# Patient Record
Sex: Male | Born: 1945 | Race: Black or African American | Hispanic: No | Marital: Single | Smoking: Former smoker
Health system: Southern US, Community
[De-identification: ages and names within clinical notes are randomized; demographics above are authoritative.]

## PROBLEM LIST (undated history)

## (undated) DIAGNOSIS — C159 Malignant neoplasm of esophagus, unspecified: Secondary | ICD-10-CM

## (undated) DIAGNOSIS — K219 Gastro-esophageal reflux disease without esophagitis: Secondary | ICD-10-CM

## (undated) DIAGNOSIS — G060 Intracranial abscess and granuloma: Secondary | ICD-10-CM

## (undated) DIAGNOSIS — I10 Essential (primary) hypertension: Secondary | ICD-10-CM

## (undated) DIAGNOSIS — E785 Hyperlipidemia, unspecified: Secondary | ICD-10-CM

## (undated) DIAGNOSIS — Z923 Personal history of irradiation: Secondary | ICD-10-CM

## (undated) DIAGNOSIS — K2270 Barrett's esophagus without dysplasia: Secondary | ICD-10-CM

## (undated) HISTORY — DX: Gastro-esophageal reflux disease without esophagitis: K21.9

## (undated) HISTORY — DX: Personal history of irradiation: Z92.3

## (undated) HISTORY — DX: Hyperlipidemia, unspecified: E78.5

## (undated) HISTORY — DX: Essential (primary) hypertension: I10

## (undated) HISTORY — DX: Intracranial abscess and granuloma: G06.0

---

## 2007-06-11 ENCOUNTER — Ambulatory Visit: Payer: Self-pay | Admitting: Gastroenterology

## 2007-06-25 ENCOUNTER — Ambulatory Visit: Payer: Self-pay | Admitting: Gastroenterology

## 2007-12-29 ENCOUNTER — Encounter: Admission: RE | Admit: 2007-12-29 | Discharge: 2007-12-29 | Payer: Self-pay | Admitting: Family Medicine

## 2009-03-14 ENCOUNTER — Encounter: Admission: RE | Admit: 2009-03-14 | Discharge: 2009-03-14 | Payer: Self-pay | Admitting: Family Medicine

## 2012-05-06 ENCOUNTER — Other Ambulatory Visit: Payer: Self-pay | Admitting: Family Medicine

## 2012-05-06 ENCOUNTER — Ambulatory Visit
Admission: RE | Admit: 2012-05-06 | Discharge: 2012-05-06 | Disposition: A | Discharge status: Home / Self Care | Payer: Self-pay | Source: Ambulatory Visit | Attending: Family Medicine | Admitting: Family Medicine

## 2012-05-06 DIAGNOSIS — M25559 Pain in unspecified hip: Secondary | ICD-10-CM

## 2013-10-10 DIAGNOSIS — G060 Intracranial abscess and granuloma: Secondary | ICD-10-CM

## 2013-10-10 HISTORY — DX: Intracranial abscess and granuloma: G06.0

## 2013-12-29 DIAGNOSIS — K2270 Barrett's esophagus without dysplasia: Secondary | ICD-10-CM

## 2013-12-29 HISTORY — DX: Barrett's esophagus without dysplasia: K22.70

## 2014-01-19 ENCOUNTER — Encounter: Payer: Self-pay | Admitting: Gastroenterology

## 2014-01-19 ENCOUNTER — Telehealth: Payer: Self-pay | Admitting: Gastroenterology

## 2014-01-19 NOTE — Telephone Encounter (Signed)
Pt having problems swallowing and is c/o reflux, gerd. Dr. Kennon Holter requests pt be seen. Pt scheduled to see Dr. Deatra Ina tomorrow morning at 10:30am. Jana Half to notify pt of appt date and time.

## 2014-01-20 ENCOUNTER — Encounter: Payer: Self-pay | Admitting: Gastroenterology

## 2014-01-20 ENCOUNTER — Ambulatory Visit (INDEPENDENT_AMBULATORY_CARE_PROVIDER_SITE_OTHER): Payer: Medicare Other | Admitting: Gastroenterology

## 2014-01-20 VITALS — BP 112/70 | HR 70 | Ht 66.0 in | Wt 213.0 lb

## 2014-01-20 DIAGNOSIS — R1310 Dysphagia, unspecified: Secondary | ICD-10-CM

## 2014-01-20 DIAGNOSIS — I10 Essential (primary) hypertension: Secondary | ICD-10-CM

## 2014-01-20 NOTE — Progress Notes (Signed)
    _                                                                                                                History of Present Illness: Pleasant 68 year old Afro-American male referred for evaluation of dysphagia.  He has been complaining of dysphagia to solids for at least 4 weeks.  He requires liquids to pass a solid food bolus.  He denies odynophagia.  He rarely has pyrosis.  He is on omeprazole.  He claims he has similar problems when he was 35 but does not recall any specific therapy.  Last colonoscopy approximately 7 years ago apparently was normal.    Past Medical History  Diagnosis Date  . GERD (gastroesophageal reflux disease)   . Hypertension   . Hyperlipemia    History reviewed. No pertinent past surgical history. family history includes Breast cancer in his sister. Current Outpatient Prescriptions  Medication Sig Dispense Refill  . cloNIDine (CATAPRES) 0.1 MG tablet Take 0.1 mg by mouth 2 (two) times daily.      Marland Kitchen dutasteride (AVODART) 0.5 MG capsule Take 0.5 mg by mouth daily.      Marland Kitchen omeprazole (PRILOSEC) 40 MG capsule Take 40 mg by mouth daily.      . simvastatin (ZOCOR) 40 MG tablet Take 40 mg by mouth daily.      . valsartan-hydrochlorothiazide (DIOVAN-HCT) 160-25 MG per tablet Take 1 tablet by mouth daily.       No current facility-administered medications for this visit.   Allergies as of 01/20/2014  . (No Known Allergies)    reports that he quit smoking about 15 years ago. He has never used smokeless tobacco. He reports that he drinks alcohol. He reports that he does not use illicit drugs.     Review of Systems: Pertinent positive and negative review of systems were noted in the above HPI section. All other review of systems were otherwise negative.  Vital signs were reviewed in today's medical record Physical Exam: General: Well developed , well nourished, no acute distress Skin: anicteric Head: Normocephalic and atraumatic Eyes:   sclerae anicteric, EOMI Ears: Normal auditory acuity Mouth: No deformity or lesions Neck: Supple, no masses or thyromegaly Lungs: Clear throughout to auscultation Heart: Regular rate and rhythm; no murmurs, rubs or bruits Abdomen: Soft, non tender and non distended. No masses, hepatosplenomegaly or hernias noted. Normal Bowel sounds Rectal:deferred Musculoskeletal: Symmetrical with no gross deformities  Skin: No lesions on visible extremities Pulses:  Normal pulses noted Extremities: No clubbing, cyanosis, edema or deformities noted Neurological: Alert oriented x 4, grossly nonfocal Cervical Nodes:  No significant cervical adenopathy Inguinal Nodes: No significant inguinal adenopathy Psychological:  Alert and cooperative. Normal mood and affect  See Assessment and Plan under Problem List

## 2014-01-20 NOTE — Patient Instructions (Signed)

## 2014-01-20 NOTE — Assessment & Plan Note (Signed)
Solid food dysphagia most likely is 2 to a peptic esophageal stricture.  Recommendations #1 continue omeprazole #2 upper endoscopy with dilatation as indicated  Risks, alternatives, and complications of the procedure, including bleeding, perforation, and possible need for surgery, were explained to the patient.  Patient's questions were answered.

## 2014-01-21 ENCOUNTER — Encounter: Payer: Medicare Other | Admitting: Gastroenterology

## 2014-01-26 ENCOUNTER — Ambulatory Visit (AMBULATORY_SURGERY_CENTER): Payer: Medicare Other | Admitting: Gastroenterology

## 2014-01-26 ENCOUNTER — Telehealth: Payer: Self-pay

## 2014-01-26 ENCOUNTER — Other Ambulatory Visit: Payer: Self-pay

## 2014-01-26 ENCOUNTER — Encounter: Payer: Self-pay | Admitting: Gastroenterology

## 2014-01-26 ENCOUNTER — Other Ambulatory Visit (INDEPENDENT_AMBULATORY_CARE_PROVIDER_SITE_OTHER): Payer: Medicare Other

## 2014-01-26 VITALS — BP 120/73 | HR 63 | Temp 97.8°F | Resp 20 | Ht 66.0 in | Wt 213.0 lb

## 2014-01-26 DIAGNOSIS — I10 Essential (primary) hypertension: Secondary | ICD-10-CM

## 2014-01-26 DIAGNOSIS — C159 Malignant neoplasm of esophagus, unspecified: Secondary | ICD-10-CM

## 2014-01-26 DIAGNOSIS — K229 Disease of esophagus, unspecified: Secondary | ICD-10-CM

## 2014-01-26 DIAGNOSIS — K228 Other specified diseases of esophagus: Secondary | ICD-10-CM

## 2014-01-26 DIAGNOSIS — D490 Neoplasm of unspecified behavior of digestive system: Secondary | ICD-10-CM

## 2014-01-26 DIAGNOSIS — R1310 Dysphagia, unspecified: Secondary | ICD-10-CM

## 2014-01-26 DIAGNOSIS — K2289 Other specified disease of esophagus: Secondary | ICD-10-CM

## 2014-01-26 DIAGNOSIS — D6859 Other primary thrombophilia: Secondary | ICD-10-CM

## 2014-01-26 HISTORY — DX: Malignant neoplasm of esophagus, unspecified: C15.9

## 2014-01-26 LAB — CBC WITH DIFFERENTIAL/PLATELET
BASOS PCT: 0.3 % (ref 0.0–3.0)
Basophils Absolute: 0 10*3/uL (ref 0.0–0.1)
EOS PCT: 4.4 % (ref 0.0–5.0)
Eosinophils Absolute: 0.2 10*3/uL (ref 0.0–0.7)
HEMATOCRIT: 31.6 % — AB (ref 39.0–52.0)
HEMOGLOBIN: 10.4 g/dL — AB (ref 13.0–17.0)
LYMPHS ABS: 1.3 10*3/uL (ref 0.7–4.0)
Lymphocytes Relative: 23.7 % (ref 12.0–46.0)
MCHC: 33 g/dL (ref 30.0–36.0)
MCV: 82.3 fl (ref 78.0–100.0)
MONO ABS: 0.5 10*3/uL (ref 0.1–1.0)
MONOS PCT: 8.8 % (ref 3.0–12.0)
Neutro Abs: 3.4 10*3/uL (ref 1.4–7.7)
Neutrophils Relative %: 62.8 % (ref 43.0–77.0)
PLATELETS: 256 10*3/uL (ref 150.0–400.0)
RBC: 3.84 Mil/uL — AB (ref 4.22–5.81)
RDW: 15.2 % — ABNORMAL HIGH (ref 11.5–14.6)
WBC: 5.4 10*3/uL (ref 4.5–10.5)

## 2014-01-26 LAB — COMPREHENSIVE METABOLIC PANEL
ALBUMIN: 3.3 g/dL — AB (ref 3.5–5.2)
ALT: 9 U/L (ref 0–53)
AST: 12 U/L (ref 0–37)
Alkaline Phosphatase: 62 U/L (ref 39–117)
BILIRUBIN TOTAL: 0.6 mg/dL (ref 0.3–1.2)
BUN: 11 mg/dL (ref 6–23)
CO2: 29 meq/L (ref 19–32)
Calcium: 8.8 mg/dL (ref 8.4–10.5)
Chloride: 104 mEq/L (ref 96–112)
Creatinine, Ser: 1 mg/dL (ref 0.4–1.5)
GFR: 98.92 mL/min (ref >60.00)
GLUCOSE: 105 mg/dL — AB (ref 70–99)
POTASSIUM: 3.9 meq/L (ref 3.5–5.1)
SODIUM: 138 meq/L (ref 135–145)
TOTAL PROTEIN: 6.3 g/dL (ref 6.0–8.3)

## 2014-01-26 LAB — PROTIME-INR
INR: 1.1 ratio — AB (ref 0.8–1.0)
PROTHROMBIN TIME: 11.7 s (ref 9.6–13.1)

## 2014-01-26 MED ORDER — SODIUM CHLORIDE 0.9 % IV SOLN: 500.0000 mL | INTRAVENOUS | Status: DC

## 2014-01-26 NOTE — Progress Notes (Addendum)
Per Dr. Deatra Ina procedure report not given to pt, Dr. Deatra Ina spoke with pt and family member (niece)-adm  Pt sent to down to lab after discharge-adm

## 2014-01-26 NOTE — Patient Instructions (Addendum)

## 2014-01-26 NOTE — Progress Notes (Signed)
Called to room to assist during endoscopic procedure.  Patient ID and intended procedure confirmed with present staff. Received instructions for my participation in the procedure from the performing physician.  

## 2014-01-26 NOTE — Op Note (Signed)
[place]  [address]. [place], [id number]   ENDOSCOPY PROCEDURE REPORT  PATIENT: [name] [name], [name] [name]  MR#: [id number] BIRTHDATE: Oct 06, 1945 , 68  yrs. old GENDER: Male ENDOSCOPIST: Inda Castle, MD REFERRED BY:  Lindwood Qua, M.D. PROCEDURE DATE:  01/26/2014 PROCEDURE:  EGD w/ biopsy ASA CLASS:     Class II INDICATIONS:  Dysphagia. MEDICATIONS: MAC sedation, administered by CRNA, propofol (Diprivan) 150mg  IV, and Simethicone 0.6cc PO TOPICAL ANESTHETIC: Cetacaine Spray  DESCRIPTION OF PROCEDURE: After the risks benefits and alternatives of the procedure were thoroughly explained, informed consent was obtained.  The LB [id number] endoscope was introduced through the mouth and advanced to the third portion of the duodenum. Without limitations.  The instrument was slowly withdrawn as the mucosa was fully examined.      Beginning at approximately 35 cm from the incisors there was an exophytic, friable mass.  The squamocolumnar junction was also noted at 35 cm.  The mass extended to the GE junction where there was a moderate stricture.  The 9 mm scope passed with resistance. Multiple biopsies were taken.   Beginning at approximately 35 cm from the incisors there was an exophytic, friable mass.  The squamocolumnar junction was also noted at 35 cm.  The mass extended to the GE junction where there was a moderate stricture.  The 9 mm scope passed with resistance.  Multiple biopsies were taken.   The remainder of the upper endoscopy exam was otherwise normal. Retroflexed views revealed no abnormalities.     The scope was then withdrawn from the patient and the procedure completed.  COMPLICATIONS: There were no complications. ENDOSCOPIC IMPRESSION: 1.   esophageal neoplasm 2.  Barrett's esophagus  RECOMMENDATIONS: CT of the chest, abdomen and pelvis CBC, comprehensive metabolic profile, INR oncology referral REPEAT EXAM:  eSigned:  Inda Castle,  MD 01/26/2014 11:07 AM   CC:  PATIENT NAME:  [name] [name], [name] [name] MR#: [id number]

## 2014-01-26 NOTE — Telephone Encounter (Signed)
Pt scheduled for CT CAP at [facility] CT 01/28/14@9 :30am. Pt to be NPO after 5:30am, drink bottle 1 of contrast at 7:30am, bottle 2 of contrast at 8:30am. [place] nurse to give pt instructions and sent pt to the lab on the way out from clinic.

## 2014-01-27 ENCOUNTER — Telehealth: Payer: Self-pay | Admitting: *Deleted

## 2014-01-27 NOTE — Telephone Encounter (Signed)
Called left vm message to call me and gave my phone number for an appt

## 2014-01-27 NOTE — Telephone Encounter (Signed)
Left message that we called for f/u 

## 2014-01-27 NOTE — Telephone Encounter (Signed)
Called pt with appt time and place.   He verbalized understanding.

## 2014-01-28 ENCOUNTER — Encounter (HOSPITAL_COMMUNITY): Payer: Self-pay

## 2014-01-28 ENCOUNTER — Ambulatory Visit (HOSPITAL_COMMUNITY)
Admission: RE | Admit: 2014-01-28 | Discharge: 2014-01-28 | Disposition: A | Discharge status: Home / Self Care | Payer: Medicare Other | Source: Ambulatory Visit | Attending: Gastroenterology | Admitting: Gastroenterology

## 2014-01-28 ENCOUNTER — Ambulatory Visit (HOSPITAL_COMMUNITY): Admission: RE | Admit: 2014-01-28 | Payer: Medicare Other | Source: Ambulatory Visit

## 2014-01-28 ENCOUNTER — Other Ambulatory Visit: Payer: Self-pay | Admitting: Gastroenterology

## 2014-01-28 ENCOUNTER — Ambulatory Visit
Admission: RE | Admit: 2014-01-28 | Discharge: 2014-01-28 | Disposition: A | Discharge status: Home / Self Care | Payer: Medicare Other | Source: Ambulatory Visit | Attending: Gastroenterology | Admitting: Gastroenterology

## 2014-01-28 DIAGNOSIS — R599 Enlarged lymph nodes, unspecified: Secondary | ICD-10-CM | POA: Insufficient documentation

## 2014-01-28 DIAGNOSIS — K229 Disease of esophagus, unspecified: Secondary | ICD-10-CM

## 2014-01-28 DIAGNOSIS — K228 Other specified diseases of esophagus: Secondary | ICD-10-CM

## 2014-01-28 DIAGNOSIS — D490 Neoplasm of unspecified behavior of digestive system: Secondary | ICD-10-CM

## 2014-01-28 DIAGNOSIS — K2289 Other specified disease of esophagus: Secondary | ICD-10-CM

## 2014-01-28 DIAGNOSIS — J8410 Pulmonary fibrosis, unspecified: Secondary | ICD-10-CM | POA: Insufficient documentation

## 2014-01-28 DIAGNOSIS — N289 Disorder of kidney and ureter, unspecified: Secondary | ICD-10-CM | POA: Insufficient documentation

## 2014-01-28 DIAGNOSIS — J479 Bronchiectasis, uncomplicated: Secondary | ICD-10-CM | POA: Insufficient documentation

## 2014-01-28 MED ORDER — IOHEXOL 300 MG/ML  SOLN
50.0000 mL | Freq: Once | INTRAMUSCULAR | Status: AC | PRN
  Administered 2014-01-28: 50 mL via ORAL

## 2014-01-28 MED ORDER — IOHEXOL 300 MG/ML  SOLN
100.0000 mL | Freq: Once | INTRAMUSCULAR | Status: AC | PRN
  Administered 2014-01-28: 100 mL via INTRAVENOUS

## 2014-02-01 ENCOUNTER — Encounter: Payer: Self-pay | Admitting: Internal Medicine

## 2014-02-01 ENCOUNTER — Ambulatory Visit (HOSPITAL_BASED_OUTPATIENT_CLINIC_OR_DEPARTMENT_OTHER): Payer: Medicare Other | Admitting: Internal Medicine

## 2014-02-01 ENCOUNTER — Other Ambulatory Visit: Payer: Self-pay

## 2014-02-01 ENCOUNTER — Other Ambulatory Visit: Payer: Self-pay | Admitting: Internal Medicine

## 2014-02-01 ENCOUNTER — Telehealth: Payer: Self-pay

## 2014-02-01 ENCOUNTER — Ambulatory Visit: Payer: Medicare Other

## 2014-02-01 ENCOUNTER — Ambulatory Visit (HOSPITAL_BASED_OUTPATIENT_CLINIC_OR_DEPARTMENT_OTHER): Payer: Medicare Other

## 2014-02-01 ENCOUNTER — Telehealth: Payer: Self-pay | Admitting: Internal Medicine

## 2014-02-01 VITALS — BP 138/65 | HR 62 | Temp 97.5°F | Resp 18 | Ht 66.0 in | Wt 208.6 lb

## 2014-02-01 DIAGNOSIS — C155 Malignant neoplasm of lower third of esophagus: Secondary | ICD-10-CM

## 2014-02-01 DIAGNOSIS — Z87891 Personal history of nicotine dependence: Secondary | ICD-10-CM

## 2014-02-01 DIAGNOSIS — C159 Malignant neoplasm of esophagus, unspecified: Secondary | ICD-10-CM

## 2014-02-01 DIAGNOSIS — Z803 Family history of malignant neoplasm of breast: Secondary | ICD-10-CM

## 2014-02-01 LAB — COMPREHENSIVE METABOLIC PANEL (CC13)
ALK PHOS: 72 U/L (ref 40–150)
ALT: <6 U/L (ref 0–55)
AST: 10 U/L (ref 5–34)
Albumin: 3.3 g/dL — ABNORMAL LOW (ref 3.5–5.0)
Anion Gap: 9 mEq/L (ref 3–11)
BILIRUBIN TOTAL: 0.79 mg/dL (ref 0.20–1.20)
BUN: 9.6 mg/dL (ref 7.0–26.0)
CO2: 27 mEq/L (ref 22–29)
Calcium: 9.5 mg/dL (ref 8.4–10.4)
Chloride: 106 mEq/L (ref 98–109)
Creatinine: 1.1 mg/dL (ref 0.7–1.3)
Glucose: 106 mg/dl (ref 70–140)
Potassium: 4 mEq/L (ref 3.5–5.1)
SODIUM: 142 meq/L (ref 136–145)
Total Protein: 6.6 g/dL (ref 6.4–8.3)

## 2014-02-01 LAB — CBC WITH DIFFERENTIAL/PLATELET
BASO%: 0.8 % (ref 0.0–2.0)
BASOS ABS: 0 10*3/uL (ref 0.0–0.1)
EOS%: 3.3 % (ref 0.0–7.0)
Eosinophils Absolute: 0.2 10*3/uL (ref 0.0–0.5)
HCT: 33.2 % — ABNORMAL LOW (ref 38.4–49.9)
HGB: 10.5 g/dL — ABNORMAL LOW (ref 13.0–17.1)
LYMPH%: 21.6 % (ref 14.0–49.0)
MCH: 26 pg — ABNORMAL LOW (ref 27.2–33.4)
MCHC: 31.5 g/dL — ABNORMAL LOW (ref 32.0–36.0)
MCV: 82.6 fL (ref 79.3–98.0)
MONO#: 0.5 10*3/uL (ref 0.1–0.9)
MONO%: 11.4 % (ref 0.0–14.0)
NEUT#: 2.9 10*3/uL (ref 1.5–6.5)
NEUT%: 62.9 % (ref 39.0–75.0)
PLATELETS: 274 10*3/uL (ref 140–400)
RBC: 4.02 10*6/uL — AB (ref 4.20–5.82)
RDW: 15.3 % — ABNORMAL HIGH (ref 11.0–14.6)
WBC: 4.7 10*3/uL (ref 4.0–10.3)
lymph#: 1 10*3/uL (ref 0.9–3.3)

## 2014-02-01 NOTE — Telephone Encounter (Signed)
Pt has been scheduled for 02/03/14 12 noon Left message on machine to call back

## 2014-02-01 NOTE — Telephone Encounter (Signed)
Gave pt appt for MD and Radiation Oncology, called Dr Ardis Hughs office left message with [facility] regarding EUS, pt sent to labs today

## 2014-02-01 NOTE — Progress Notes (Signed)
Checked in new patient with no financial issues. He [place] been [place] of [place]. °

## 2014-02-01 NOTE — Telephone Encounter (Signed)
I think EUS is a good next step. thanks Rendi Mapel, He needs upper EUS, radial +/- linear, with MAC sedation at [facility], this Thursday (7th). Thanks ----- Message ----- From: Inda Castle, MD Sent: 01/31/2014 1:42 PM To: Milus Banister, MD Dan, This pt has newly diagnosed adenoCa of the esophagus ( I mentioned this case to you last week). Would you kindly look at this CT and see if you want to do an EUS? At least 1 suspicious node is identified. Thanks, rob

## 2014-02-01 NOTE — Progress Notes (Signed)
[place] Telephone:([phone]) [phone]   Fax:([phone]) [phone]  CONSULT NOTE  REFERRING PHYSICIAN: Dr. [name]  REASON FOR CONSULTATION:  68 years old African American male recently diagnosed with esophageal carcinoma  HPI [name] [name] is a 68 y.o. male with a past medical history significant for hypertension, dyslipidemia, GERD as well as benign prostatic hypertrophy. The patient has been complaining of swallowing difficulty for 3-4 weeks with increased mucus production and difficulty swallowing solid food. He has a history of acid reflux and has been on treatment with Nexium but he was not taking it on daily basis. He also lost around 50 pounds in the last few weeks. He was seen by his primary care physician and referred to Dr. [name] for evaluation. On 01/26/2014 he underwent upper endoscopy under the care of Dr. Deatra Ina and it showed an exophytic friable mass at approximately 35 CM from the incisors. The mass extended to the GE junction where there was a moderate stricture. Multiple biopsies were taken. The findings were concerning for esophageal neoplasm as well as Barrett's esophagus. CT scan of the chest, abdomen and pelvis were performed on 01/28/2014. It showed marked irregular thickening of the distal esophagus at and immediately above the gastroesophageal junction, corresponds to known esophageal mass. Proximal to this, the esophagus is markedly dilated, with a a large air-fluid level. Prominent low right paratracheal lymph node measures 8 mm in short axis. Subcarinal  lymph node measuring 9 mm in short axis. No definite mediastinal or hilar lymph nodes meet criteria for pathologic enlargement at this  time. There was also enlarged lymph nodes in the gastrohepatic ligament measuring 1.6 CM in short axis concerning for nodal metastasis.  The patient was referred to me today for further evaluation and recommendation regarding his condition. When seen today he is  feeling fine except for the weight loss and dysphagia which is slightly better after the procedure. He denied having any significant chest pain, shortness of breath, cough or hemoptysis. He has no nausea or vomiting. The patient denied having any headache or visual changes. His family history significant for a sister who was diagnosed with breast cancer. The patient is single and has one son. He was accompanied by his niece [name] [name]. He is a former Nature conservation officer. He has a history of smoking 2 packs per day for around 35 years but quit 15 years ago. He also has a history of alcohol abuse but not recently and no history of drug abuse.  HPI  Past Medical History  Diagnosis Date  . GERD (gastroesophageal reflux disease)   . Hypertension   . Hyperlipemia     History reviewed. No pertinent past surgical history.  Family History  Problem Relation Age of Onset  . Breast cancer Sister     Social History History  Substance Use Topics  . Smoking status: Former Smoker    Quit date: 09/30/1998  . Smokeless tobacco: Never Used  . Alcohol Use: Yes     Comment: occasional    No Known Allergies  Current Outpatient Prescriptions  Medication Sig Dispense Refill  . cloNIDine (CATAPRES) 0.1 MG tablet Take 0.1 mg by mouth 2 (two) times daily.      Marland Kitchen dutasteride (AVODART) 0.5 MG capsule Take 0.5 mg by mouth daily.      Marland Kitchen omeprazole (PRILOSEC) 40 MG capsule Take 40 mg by mouth daily.      . simvastatin (ZOCOR) 40 MG tablet Take 40 mg by mouth daily.      Marland Kitchen  valsartan-hydrochlorothiazide (DIOVAN-HCT) 160-25 MG per tablet Take 1 tablet by mouth daily.       No current facility-administered medications for this visit.    Review of Systems  Constitutional: positive for fatigue and weight loss Eyes: negative Ears, nose, mouth, throat, and face: negative Respiratory: negative Cardiovascular: negative Gastrointestinal: positive for dyspepsia and  dysphagia Genitourinary:negative Integument/breast: negative Hematologic/lymphatic: negative Musculoskeletal:negative Neurological: negative Behavioral/Psych: negative Endocrine: negative Allergic/Immunologic: negative  Physical Exam  ION:GEXBM, healthy, no distress, well nourished and well developed SKIN: skin color, texture, turgor are normal, no rashes or significant lesions HEAD: Normocephalic, No masses, lesions, tenderness or abnormalities EYES: normal, PERRLA EARS: External ears normal, Canals clear OROPHARYNX:no exudate, no erythema and lips, buccal mucosa, and tongue normal  NECK: supple, no adenopathy, no JVD LYMPH:  no palpable lymphadenopathy, no hepatosplenomegaly LUNGS: clear to auscultation , and palpation HEART: regular rate & rhythm, no murmurs and no gallops ABDOMEN:abdomen soft, non-tender, normal bowel sounds and no masses or organomegaly BACK: Back symmetric, no curvature., No CVA tenderness EXTREMITIES:no joint deformities, effusion, or inflammation, no edema, no skin discoloration, no clubbing  NEURO: alert & oriented x 3 with fluent speech, no focal motor/sensory deficits  PERFORMANCE STATUS: ECOG 1  LABORATORY DATA: Lab Results  Component Value Date   WBC 5.4 01/26/2014   HGB 10.4* 01/26/2014   HCT 31.6* 01/26/2014   MCV 82.3 01/26/2014   PLT 256.0 01/26/2014      Chemistry      Component Value Date/Time   NA 138 01/26/2014 1148   K 3.9 01/26/2014 1148   CL 104 01/26/2014 1148   CO2 29 01/26/2014 1148   BUN 11 01/26/2014 1148   CREATININE 1.0 01/26/2014 1148      Component Value Date/Time   CALCIUM 8.8 01/26/2014 1148   ALKPHOS 62 01/26/2014 1148   AST 12 01/26/2014 1148   ALT 9 01/26/2014 1148   BILITOT 0.6 01/26/2014 1148       RADIOGRAPHIC STUDIES: Ct Chest W Contrast  01/28/2014   CLINICAL DATA:  History of esophageal mass in the distal esophagus. Evaluate for metastatic disease.  EXAM: CT CHEST, ABDOMEN, AND PELVIS WITH CONTRAST  TECHNIQUE:  Multidetector CT imaging of the chest, abdomen and pelvis was performed following the standard protocol during bolus administration of intravenous contrast.  CONTRAST:  100 mL of Omnipaque 300.  COMPARISON:  No priors.  FINDINGS: CT CHEST FINDINGS  Mediastinum: Marked irregular thickening of the distal esophagus at and immediately above the gastroesophageal junction, corresponds to known esophageal mass. Proximal to this, the esophagus is markedly dilated, with a a large air-fluid level. Prominent low right paratracheal lymph node measures 8 mm in short axis. Subcarinal lymph node measuring 9 mm in short axis. No definite mediastinal or hilar lymph nodes meet criteria for pathologic enlargement at this time. Heart size is normal. There is no significant pericardial fluid, thickening or pericardial calcification.  Lungs/Pleura: Throughout the lung bases bilaterally there is widespread ground-glass attenuation with associated septal thickening, thickening of the peribronchovascular interstitium and mild cylindrical bronchiectasis. 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 No acute consolidative airspace disease. No pleural effusions. Multiple tiny calcified granulomas throughout the lung bases bilaterally.  Musculoskeletal: There are no aggressive appearing lytic or blastic lesions noted in the visualized portions of the skeleton.  CT ABDOMEN AND PELVIS FINDINGS  Abdomen/Pelvis: Enlarged lymph node in the gastrohepatic ligament measuring 16 mm in short axis (image 55 of series 2), concerning for nodal metastasis. The thickening  of the gastroesophageal junction does not involve the more distal portions of the stomach. The appearance of the liver, gallbladder, pancreas, spleen and bilateral adrenal glands is unremarkable. Sub cm low-attenuation lesions in the kidneys bilaterally are too small to definitively characterize, but are favored to represent tiny cysts. Normal appendix. No  significant volume of ascites. No pneumoperitoneum. No pathologic distention of small bowel. Prostate gland and urinary bladder are unremarkable in appearance.  Musculoskeletal: There are no aggressive appearing lytic or blastic lesions noted in the visualized portions of the skeleton.  IMPRESSION: 1. Marked irregular masslike thickening at an immediately above the gastroesophageal junction, compatible with the reported esophageal neoplasm. 16 mm short axis gastrohepatic ligament lymph node is concerning for a local nodal metastasis. Note definite signs of distal metastatic disease in the chest, abdomen or pelvis on today's examination. 2. 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 Rheumatologic consultation is suggested. 3. Additional incidental findings, as above.   Electronically Signed   By: Vinnie Langton M.D.   On: 01/28/2014 14:18   Ct Abdomen Pelvis W Contrast  01/28/2014   CLINICAL DATA:  History of esophageal mass in the distal esophagus. Evaluate for metastatic disease.  EXAM: CT CHEST, ABDOMEN, AND PELVIS WITH CONTRAST  TECHNIQUE: Multidetector CT imaging of the chest, abdomen and pelvis was performed following the standard protocol during bolus administration of intravenous contrast.  CONTRAST:  100 mL of Omnipaque 300.  COMPARISON:  No priors.  FINDINGS: CT CHEST FINDINGS  Mediastinum: Marked irregular thickening of the distal esophagus at and immediately above the gastroesophageal junction, corresponds to known esophageal mass. Proximal to this, the esophagus is markedly dilated, with a a large air-fluid level. Prominent low right  paratracheal lymph node measures 8 mm in short axis. Subcarinal lymph node measuring 9 mm in short axis. No definite mediastinal or hilar lymph nodes meet criteria for pathologic enlargement at this time. Heart size is normal. There is no significant pericardial fluid, thickening or pericardial calcification.  Lungs/Pleura: Throughout the lung bases bilaterally there is widespread ground-glass attenuation with associated septal thickening, thickening of the peribronchovascular interstitium and mild cylindrical bronchiectasis. 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 No acute consolidative airspace disease. No pleural effusions. Multiple tiny calcified granulomas throughout the lung bases bilaterally.  Musculoskeletal: There are no aggressive appearing lytic or blastic lesions noted in the visualized portions of the skeleton.  CT ABDOMEN AND PELVIS FINDINGS  Abdomen/Pelvis: Enlarged lymph node in the gastrohepatic ligament measuring 16 mm in short axis (image 55 of series 2), concerning for nodal metastasis. The thickening of the gastroesophageal junction does not involve the more distal portions of the stomach. The appearance of the liver, gallbladder, pancreas, spleen and bilateral adrenal glands is unremarkable. Sub cm low-attenuation lesions in the kidneys bilaterally are too small to definitively characterize, but are favored to represent tiny cysts. Normal appendix. No significant volume of ascites. No pneumoperitoneum. No pathologic distention of small bowel. Prostate gland and urinary bladder are unremarkable in appearance.  Musculoskeletal: There are no aggressive appearing lytic or blastic lesions noted in the visualized portions of the skeleton.  IMPRESSION: 1. Marked irregular masslike thickening at an immediately above the gastroesophageal junction, compatible  with the reported esophageal neoplasm. 16 mm short axis gastrohepatic ligament lymph node is concerning for a  local nodal metastasis. Note definite signs of distal metastatic disease in the chest, abdomen or pelvis on today's examination. 2. 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 Rheumatologic consultation is suggested. 3. Additional incidental findings, as above.   Electronically Signed   By: Vinnie Langton M.D.   On: 01/28/2014 14:18    ASSESSMENT: This is a very pleasant 68 years old [place] American male recently diagnosed with at least a stage IIIA (T1, N2, M0) distal esophageal adenocarcinoma diagnosed in April 2015 after he presented with dysphagia and weight loss for several weeks.   PLAN: I had a lengthy discussion with the patient and his niece today about his current disease stage, prognosis and treatment options. I will complete the staging workup by ordering a PET scan. I will also refer the patient to Dr. Ardis Hughs for consideration of endoscopic ultrasound for further characterization of the staging of his disease. I have a lengthy discussion with the patient and his niece about his current treatment options. I recommended for the patient a course of concurrent chemoradiation with weekly carboplatin and paclitaxel for 5 weeks followed by reevaluation to see if he would be a surgical candidate. I referred the patient to Dr. Valere Dross for evaluation and discussion of the radiotherapy option. I will see the patient back for followup visit in around 10 days for evaluation and discussion of his PET scan as well as more detailed discussion of his treatment options. I gave the patient and his niece the time to ask questions and I answered them  completely to their satisfaction. He was advised to call immediately if he has any concerning symptoms in the interval. The patient voices understanding of current disease status and treatment options and is in agreement with the current care plan.  All questions were answered. The patient knows to call the clinic with any problems, questions or concerns. We can certainly see the patient much sooner if necessary.  Thank you so much for allowing me to participate in the care of [name] [name]. I will continue to follow up the patient with you and assist in his care.  I spent 40 minutes counseling the patient face to face. The total time spent in the appointment was 60 minutes.  Disclaimer: This note was dictated with voice recognition software. Similar sounding words can inadvertently be transcribed and may not be corrected upon review.   Curt Bears 02/01/2014, 12:07 PM

## 2014-02-01 NOTE — Telephone Encounter (Signed)
EUS scheduled, pt instructed and medications reviewed.  Patient instructions mailed to home.  Patient to call with any questions or concerns. Pt instructions say 5/17 incorrectly it should read 5/7  Pt was given the correct date and will call with any questions or concerns

## 2014-02-02 ENCOUNTER — Encounter: Payer: Self-pay | Admitting: Radiation Oncology

## 2014-02-02 ENCOUNTER — Encounter (HOSPITAL_COMMUNITY): Payer: Self-pay | Admitting: *Deleted

## 2014-02-02 ENCOUNTER — Encounter (HOSPITAL_COMMUNITY): Payer: Self-pay | Admitting: Pharmacy Technician

## 2014-02-02 LAB — CEA: CEA: 12.4 ng/mL — ABNORMAL HIGH (ref 0.0–5.0)

## 2014-02-02 NOTE — Progress Notes (Signed)
GI Location of Tumor / Histology: distal esophagus immediately above GE junction  Patient presented < 1 month ago with symptoms of: dysphagia x 3-4 weeks, increased mucus production, weight loss of approx 50 lbs  Biopsies of esophagus (if applicable) revealed:  3/35/45 Diagnosis Surgical [P], esophageal biopsy - ADENOCARCINOMA, SEE COMMENT. Microscopic Comment Dr. Nicoletta Dress reviewed this case and concurs.  Past/Anticipated interventions by surgeon, if any: Dr Julien Nordmann: referral to Dr Ardis Hughs for consideration of endoscopic [place] for further staging Possible surgery following concurrent chemoradiation w/weekly carbo/paclitaxel.   Past/Anticipated interventions by medical oncology, if any: Dr Julien Nordmann:  I will complete the staging workup by ordering a PET scan.  I have a lengthy discussion with the patient and his niece about his current treatment options. I recommended for the patient a course of concurrent chemoradiation with weekly carboplatin and paclitaxel for 5 weeks followed by reevaluation to see if he would be a surgical candidate.  I referred the patient to Dr. Valere Dross for evaluation and discussion of the radiotherapy option.  I will see the patient back for followup visit in around 10 days for evaluation and discussion of his PET scan as well as more detailed discussion of his treatment options.  Weight changes, if any: not in past 3 weeks  Bowel/Bladder complaints, if any: no  Nausea / Vomiting, if any: no  Pain issues, if any:  no  SAFETY ISSUES:  Prior radiation? no  Pacemaker/ICD? no  Possible current pregnancy? NA  Is the patient on methotrexate? no  Current Complaints / other details:  PET scan on 02/11/14, lives alone, 1 son, 3 granddaughters, 3 grandsons, retired from [employer], niece w/pt today. Pt states he has to be "picky about what he eats".  02/03/14 EUS Dr Ardis Hughs

## 2014-02-03 ENCOUNTER — Ambulatory Visit (HOSPITAL_COMMUNITY): Payer: Medicare Other | Admitting: Anesthesiology

## 2014-02-03 ENCOUNTER — Encounter (HOSPITAL_COMMUNITY): Payer: Self-pay | Admitting: *Deleted

## 2014-02-03 ENCOUNTER — Encounter (HOSPITAL_COMMUNITY): Payer: Medicare Other | Admitting: Anesthesiology

## 2014-02-03 ENCOUNTER — Ambulatory Visit
Admission: RE | Admit: 2014-02-03 | Discharge: 2014-02-03 | Disposition: A | Discharge status: Home / Self Care | Payer: Medicare Other | Source: Ambulatory Visit | Attending: Radiation Oncology | Admitting: Radiation Oncology

## 2014-02-03 ENCOUNTER — Ambulatory Visit: Admission: RE | Admit: 2014-02-03 | Payer: Medicare Other | Source: Ambulatory Visit

## 2014-02-03 ENCOUNTER — Ambulatory Visit (HOSPITAL_COMMUNITY)
Admission: RE | Admit: 2014-02-03 | Discharge: 2014-02-03 | Disposition: A | Discharge status: Home / Self Care | Payer: Medicare Other | Source: Ambulatory Visit | Attending: Gastroenterology | Admitting: Gastroenterology

## 2014-02-03 ENCOUNTER — Encounter (HOSPITAL_COMMUNITY)
Admission: RE | Disposition: A | Discharge status: Home / Self Care | Payer: Self-pay | Source: Ambulatory Visit | Attending: Gastroenterology

## 2014-02-03 DIAGNOSIS — Z87891 Personal history of nicotine dependence: Secondary | ICD-10-CM | POA: Insufficient documentation

## 2014-02-03 DIAGNOSIS — C160 Malignant neoplasm of cardia: Secondary | ICD-10-CM | POA: Insufficient documentation

## 2014-02-03 DIAGNOSIS — K219 Gastro-esophageal reflux disease without esophagitis: Secondary | ICD-10-CM | POA: Insufficient documentation

## 2014-02-03 DIAGNOSIS — Z79899 Other long term (current) drug therapy: Secondary | ICD-10-CM | POA: Insufficient documentation

## 2014-02-03 DIAGNOSIS — Z803 Family history of malignant neoplasm of breast: Secondary | ICD-10-CM | POA: Insufficient documentation

## 2014-02-03 DIAGNOSIS — R1310 Dysphagia, unspecified: Secondary | ICD-10-CM | POA: Insufficient documentation

## 2014-02-03 DIAGNOSIS — Z510 Encounter for antineoplastic radiation therapy: Secondary | ICD-10-CM | POA: Insufficient documentation

## 2014-02-03 DIAGNOSIS — E785 Hyperlipidemia, unspecified: Secondary | ICD-10-CM | POA: Insufficient documentation

## 2014-02-03 DIAGNOSIS — I10 Essential (primary) hypertension: Secondary | ICD-10-CM | POA: Insufficient documentation

## 2014-02-03 DIAGNOSIS — C159 Malignant neoplasm of esophagus, unspecified: Secondary | ICD-10-CM | POA: Diagnosis not present

## 2014-02-03 HISTORY — DX: Malignant neoplasm of esophagus, unspecified: C15.9

## 2014-02-03 HISTORY — DX: Barrett's esophagus without dysplasia: K22.70

## 2014-02-03 HISTORY — PX: EUS: SHX5427

## 2014-02-03 SURGERY — UPPER ENDOSCOPIC ULTRASOUND (EUS) LINEAR
Anesthesia: Monitor Anesthesia Care

## 2014-02-03 MED ORDER — PROPOFOL INFUSION 10 MG/ML OPTIME
INTRAVENOUS | Status: DC | PRN
  Administered 2014-02-03: 300 ug/kg/min via INTRAVENOUS

## 2014-02-03 MED ORDER — BUTAMBEN-TETRACAINE-BENZOCAINE 2-2-14 % EX AERO
INHALATION_SPRAY | CUTANEOUS | Status: DC | PRN
  Administered 2014-02-03: 2 via TOPICAL

## 2014-02-03 MED ORDER — FENTANYL CITRATE 0.05 MG/ML IJ SOLN: 25.0000 ug | INTRAMUSCULAR | Status: DC | PRN

## 2014-02-03 MED ORDER — PROPOFOL 10 MG/ML IV BOLUS
INTRAVENOUS | Status: AC
  Filled 2014-02-03: qty 20

## 2014-02-03 MED ORDER — LACTATED RINGERS IV SOLN
INTRAVENOUS | Status: DC
  Administered 2014-02-03: 1000 mL via INTRAVENOUS

## 2014-02-03 MED ORDER — SODIUM CHLORIDE 0.9 % IV SOLN: INTRAVENOUS | Status: DC

## 2014-02-03 NOTE — H&P (View-Only) (Signed)
[place] [phone]   [phone]  CONSULT NOTE  REFERRING PHYSICIAN: Dr. [name]  REASON FOR CONSULTATION:  68 years old African American male recently diagnosed with esophageal carcinoma  HPI [name] [name] is a 68 y.o. male with a past medical history significant for hypertension, dyslipidemia, GERD as well as benign prostatic hypertrophy. The patient has been complaining of swallowing difficulty for 3-4 weeks with increased mucus production and difficulty swallowing solid food. He has a history of acid reflux and has been on treatment with Nexium but he was not taking it on daily basis. He also lost around 50 pounds in the last few weeks. He was seen by his primary care physician and referred to Dr. [name] for evaluation. On 01/26/2014 he underwent upper endoscopy under the care of Dr. Deatra Ina and it showed an exophytic friable mass at approximately 35 CM from the incisors. The mass extended to the GE junction where there was a moderate stricture. Multiple biopsies were taken. The findings were concerning for esophageal neoplasm as well as Barrett's esophagus. CT scan of the chest, abdomen and pelvis were performed on 01/28/2014. It showed marked irregular thickening of the distal esophagus at and immediately above the gastroesophageal junction, corresponds to known esophageal mass. Proximal to this, the esophagus is markedly dilated, with a a large air-fluid level. Prominent low right paratracheal lymph node measures 8 mm in short axis. Subcarinal  lymph node measuring 9 mm in short axis. No definite mediastinal or hilar lymph nodes meet criteria for pathologic enlargement at this  time. There was also enlarged lymph nodes in the gastrohepatic ligament measuring 1.6 CM in short axis concerning for nodal metastasis.  The patient was referred to me today for further evaluation and recommendation regarding his condition. When seen today he is  feeling fine except for the weight loss and dysphagia which is slightly better after the procedure. He denied having any significant chest pain, shortness of breath, cough or hemoptysis. He has no nausea or vomiting. The patient denied having any headache or visual changes. His family history significant for a sister who was diagnosed with breast cancer. The patient is single and has one son. He was accompanied by his niece Jearld Fenton. He is a former Nature conservation officer. He has a history of smoking 2 packs per day for around 35 years but quit 15 years ago. He also has a history of alcohol abuse but not recently and no history of drug abuse.  HPI  Past Medical History  Diagnosis Date  . GERD (gastroesophageal reflux disease)   . Hypertension   . Hyperlipemia     History reviewed. No pertinent past surgical history.  Family History  Problem Relation Age of Onset  . Breast cancer Sister     Social History History  Substance Use Topics  . Smoking status: Former Smoker    Quit date: 09/30/1998  . Smokeless tobacco: Never Used  . Alcohol Use: Yes     Comment: occasional    No Known Allergies  Current Outpatient Prescriptions  Medication Sig Dispense Refill  . cloNIDine (CATAPRES) 0.1 MG tablet Take 0.1 mg by mouth 2 (two) times daily.      Marland Kitchen dutasteride (AVODART) 0.5 MG capsule Take 0.5 mg by mouth daily.      Marland Kitchen omeprazole (PRILOSEC) 40 MG capsule Take 40 mg by mouth daily.      . simvastatin (ZOCOR) 40 MG tablet Take 40 mg by mouth daily.      Marland Kitchen  valsartan-hydrochlorothiazide (DIOVAN-HCT) 160-25 MG per tablet Take 1 tablet by mouth daily.       No current facility-administered medications for this visit.    Review of Systems  Constitutional: positive for fatigue and weight loss Eyes: negative Ears, nose, mouth, throat, and face: negative Respiratory: negative Cardiovascular: negative Gastrointestinal: positive for dyspepsia and  dysphagia Genitourinary:negative Integument/breast: negative Hematologic/lymphatic: negative Musculoskeletal:negative Neurological: negative Behavioral/Psych: negative Endocrine: negative Allergic/Immunologic: negative  Physical Exam  ION:GEXBM, healthy, no distress, well nourished and well developed SKIN: skin color, texture, turgor are normal, no rashes or significant lesions HEAD: Normocephalic, No masses, lesions, tenderness or abnormalities EYES: normal, PERRLA EARS: External ears normal, Canals clear OROPHARYNX:no exudate, no erythema and lips, buccal mucosa, and tongue normal  NECK: supple, no adenopathy, no JVD LYMPH:  no palpable lymphadenopathy, no hepatosplenomegaly LUNGS: clear to auscultation , and palpation HEART: regular rate & rhythm, no murmurs and no gallops ABDOMEN:abdomen soft, non-tender, normal bowel sounds and no masses or organomegaly BACK: Back symmetric, no curvature., No CVA tenderness EXTREMITIES:no joint deformities, effusion, or inflammation, no edema, no skin discoloration, no clubbing  NEURO: alert & oriented x 3 with fluent speech, no focal motor/sensory deficits  PERFORMANCE STATUS: ECOG 1  LABORATORY DATA: Lab Results  Component Value Date   WBC 5.4 01/26/2014   HGB 10.4* 01/26/2014   HCT 31.6* 01/26/2014   MCV 82.3 01/26/2014   PLT 256.0 01/26/2014      Chemistry      Component Value Date/Time   NA 138 01/26/2014 1148   K 3.9 01/26/2014 1148   CL 104 01/26/2014 1148   CO2 29 01/26/2014 1148   BUN 11 01/26/2014 1148   CREATININE 1.0 01/26/2014 1148      Component Value Date/Time   CALCIUM 8.8 01/26/2014 1148   ALKPHOS 62 01/26/2014 1148   AST 12 01/26/2014 1148   ALT 9 01/26/2014 1148   BILITOT 0.6 01/26/2014 1148       RADIOGRAPHIC STUDIES: Ct Chest W Contrast  01/28/2014   CLINICAL DATA:  History of esophageal mass in the distal esophagus. Evaluate for metastatic disease.  EXAM: CT CHEST, ABDOMEN, AND PELVIS WITH CONTRAST  TECHNIQUE:  Multidetector CT imaging of the chest, abdomen and pelvis was performed following the standard protocol during bolus administration of intravenous contrast.  CONTRAST:  100 mL of Omnipaque 300.  COMPARISON:  No priors.  FINDINGS: CT CHEST FINDINGS  Mediastinum: Marked irregular thickening of the distal esophagus at and immediately above the gastroesophageal junction, corresponds to known esophageal mass. Proximal to this, the esophagus is markedly dilated, with a a large air-fluid level. Prominent low right paratracheal lymph node measures 8 mm in short axis. Subcarinal lymph node measuring 9 mm in short axis. No definite mediastinal or hilar lymph nodes meet criteria for pathologic enlargement at this time. Heart size is normal. There is no significant pericardial fluid, thickening or pericardial calcification.  Lungs/Pleura: Throughout the lung bases bilaterally there is widespread ground-glass attenuation with associated septal thickening, thickening of the peribronchovascular interstitium and mild cylindrical bronchiectasis. 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 No acute consolidative airspace disease. No pleural effusions. Multiple tiny calcified granulomas throughout the lung bases bilaterally.  Musculoskeletal: There are no aggressive appearing lytic or blastic lesions noted in the visualized portions of the skeleton.  CT ABDOMEN AND PELVIS FINDINGS  Abdomen/Pelvis: Enlarged lymph node in the gastrohepatic ligament measuring 16 mm in short axis (image 55 of series 2), concerning for nodal metastasis. The thickening  of the gastroesophageal junction does not involve the more distal portions of the stomach. The appearance of the liver, gallbladder, pancreas, spleen and bilateral adrenal glands is unremarkable. Sub cm low-attenuation lesions in the kidneys bilaterally are too small to definitively characterize, but are favored to represent tiny cysts. Normal appendix. No  significant volume of ascites. No pneumoperitoneum. No pathologic distention of small bowel. Prostate gland and urinary bladder are unremarkable in appearance.  Musculoskeletal: There are no aggressive appearing lytic or blastic lesions noted in the visualized portions of the skeleton.  IMPRESSION: 1. Marked irregular masslike thickening at an immediately above the gastroesophageal junction, compatible with the reported esophageal neoplasm. 16 mm short axis gastrohepatic ligament lymph node is concerning for a local nodal metastasis. Note definite signs of distal metastatic disease in the chest, abdomen or pelvis on today's examination. 2. 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 Rheumatologic consultation is suggested. 3. Additional incidental findings, as above.   Electronically Signed   By: Vinnie Langton M.D.   On: 01/28/2014 14:18   Ct Abdomen Pelvis W Contrast  01/28/2014   CLINICAL DATA:  History of esophageal mass in the distal esophagus. Evaluate for metastatic disease.  EXAM: CT CHEST, ABDOMEN, AND PELVIS WITH CONTRAST  TECHNIQUE: Multidetector CT imaging of the chest, abdomen and pelvis was performed following the standard protocol during bolus administration of intravenous contrast.  CONTRAST:  100 mL of Omnipaque 300.  COMPARISON:  No priors.  FINDINGS: CT CHEST FINDINGS  Mediastinum: Marked irregular thickening of the distal esophagus at and immediately above the gastroesophageal junction, corresponds to known esophageal mass. Proximal to this, the esophagus is markedly dilated, with a a large air-fluid level. Prominent low right  paratracheal lymph node measures 8 mm in short axis. Subcarinal lymph node measuring 9 mm in short axis. No definite mediastinal or hilar lymph nodes meet criteria for pathologic enlargement at this time. Heart size is normal. There is no significant pericardial fluid, thickening or pericardial calcification.  Lungs/Pleura: Throughout the lung bases bilaterally there is widespread ground-glass attenuation with associated septal thickening, thickening of the peribronchovascular interstitium and mild cylindrical bronchiectasis. 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 No acute consolidative airspace disease. No pleural effusions. Multiple tiny calcified granulomas throughout the lung bases bilaterally.  Musculoskeletal: There are no aggressive appearing lytic or blastic lesions noted in the visualized portions of the skeleton.  CT ABDOMEN AND PELVIS FINDINGS  Abdomen/Pelvis: Enlarged lymph node in the gastrohepatic ligament measuring 16 mm in short axis (image 55 of series 2), concerning for nodal metastasis. The thickening of the gastroesophageal junction does not involve the more distal portions of the stomach. The appearance of the liver, gallbladder, pancreas, spleen and bilateral adrenal glands is unremarkable. Sub cm low-attenuation lesions in the kidneys bilaterally are too small to definitively characterize, but are favored to represent tiny cysts. Normal appendix. No significant volume of ascites. No pneumoperitoneum. No pathologic distention of small bowel. Prostate gland and urinary bladder are unremarkable in appearance.  Musculoskeletal: There are no aggressive appearing lytic or blastic lesions noted in the visualized portions of the skeleton.  IMPRESSION: 1. Marked irregular masslike thickening at an immediately above the gastroesophageal junction, compatible  with the reported esophageal neoplasm. 16 mm short axis gastrohepatic ligament lymph node is concerning for a  local nodal metastasis. Note definite signs of distal metastatic disease in the chest, abdomen or pelvis on today's examination. 2. 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 Rheumatologic consultation is suggested. 3. Additional incidental findings, as above.   Electronically Signed   By: Vinnie Langton M.D.   On: 01/28/2014 14:18    ASSESSMENT: This is a very pleasant 68 years old [place] American male recently diagnosed with at least a stage IIIA (T1, N2, M0) distal esophageal adenocarcinoma diagnosed in April 2015 after he presented with dysphagia and weight loss for several weeks.   PLAN: I had a lengthy discussion with the patient and his niece today about his current disease stage, prognosis and treatment options. I will complete the staging workup by ordering a PET scan. I will also refer the patient to Dr. Ardis Hughs for consideration of endoscopic ultrasound for further characterization of the staging of his disease. I have a lengthy discussion with the patient and his niece about his current treatment options. I recommended for the patient a course of concurrent chemoradiation with weekly carboplatin and paclitaxel for 5 weeks followed by reevaluation to see if he would be a surgical candidate. I referred the patient to Dr. Valere Dross for evaluation and discussion of the radiotherapy option. I will see the patient back for followup visit in around 10 days for evaluation and discussion of his PET scan as well as more detailed discussion of his treatment options. I gave the patient and his niece the time to ask questions and I answered them  completely to their satisfaction. He was advised to call immediately if he has any concerning symptoms in the interval. The patient voices understanding of current disease status and treatment options and is in agreement with the current care plan.  All questions were answered. The patient knows to call the clinic with any problems, questions or concerns. We can certainly see the patient much sooner if necessary.  Thank you so much for allowing me to participate in the care of [name] [name]. I will continue to follow up the patient with you and assist in his care.  I spent 40 minutes counseling the patient face to face. The total time spent in the appointment was 60 minutes.  Disclaimer: This note was dictated with voice recognition software. Similar sounding words can inadvertently be transcribed and may not be corrected upon review.   Curt Bears 02/01/2014, 12:07 PM

## 2014-02-03 NOTE — Anesthesia Preprocedure Evaluation (Addendum)
Anesthesia Evaluation  Patient identified by MRN, date of birth, ID band Patient awake    Reviewed: Allergy & Precautions, H&P , NPO status , Patient's Chart, lab work & pertinent test results  Airway Mallampati: II TM Distance: >3 FB Neck ROM: Full    Dental  (+) Caps, Dental Advisory Given Both upper front capped:   Pulmonary former smoker,  70 py former smoker breath sounds clear to auscultation  Pulmonary exam normal       Cardiovascular hypertension, Pt. on medications Rhythm:Regular Rate:Normal     Neuro/Psych negative neurological ROS  negative psych ROS   GI/Hepatic Neg liver ROS, GERD-  Medicated and Poorly Controlled,Barrett's esophagus.  Esophageal cancer   Endo/Other  negative endocrine ROS  Renal/GU negative Renal ROS  negative genitourinary   Musculoskeletal negative musculoskeletal ROS (+)   Abdominal   Peds negative pediatric ROS (+)  Hematology negative hematology ROS (+) anemia , hgb 10.5   Anesthesia Other Findings   Reproductive/Obstetrics negative OB ROS                          Anesthesia Physical Anesthesia Plan  ASA: III  Anesthesia Plan: MAC   Post-op Pain Management:    Induction:   Airway Management Planned:   Additional Equipment:   Intra-op Plan:   Post-operative Plan:   Informed Consent:   Plan Discussed with: Surgeon  Anesthesia Plan Comments:         Anesthesia Quick Evaluation

## 2014-02-03 NOTE — Discharge Instructions (Signed)
YOU HAD AN ENDOSCOPIC PROCEDURE TODAY: Refer to the procedure report that was given to you for any specific questions about what was found during the examination.  If the procedure report does not answer your questions, please call your gastroenterologist to clarify. ° °YOU SHOULD EXPECT: Some feelings of bloating in the abdomen. Passage of more gas than usual.  Walking can help get rid of the air that was put into your GI tract during the procedure and reduce the bloating. If you had a lower endoscopy (such as a colonoscopy or flexible sigmoidoscopy) you may notice spotting of blood in your stool or on the toilet paper.  ° °DIET: Your first meal following the procedure should be a light meal and then it is ok to progress to your normal diet.  A half-sandwich or bowl of soup is an example of a good first meal.  Heavy or fried foods are harder to digest and may make you feel nasueas or bloated.  Drink plenty of fluids but you should avoid alcoholic beverages for 24 hours. ° °ACTIVITY: Your care partner should take you home directly after the procedure.  You should plan to take it easy, moving slowly for the rest of the day.  You can resume normal activity the day after the procedure however you should NOT DRIVE or use heavy machinery for 24 hours (because of the sedation medicines used during the test).   ° °SYMPTOMS TO REPORT IMMEDIATELY  °A gastroenterologist can be reached at any hour.  Please call your doctor's office for any of the following symptoms: ° °· Following lower endoscopy (colonoscopy, flexible sigmoidoscopy) ° Excessive amounts of blood in the stool ° Significant tenderness, worsening of abdominal pains ° Swelling of the abdomen that is new, acute ° Fever of 100° or higher °· Following upper endoscopy (EGD, EUS, ERCP) ° Vomiting of blood or coffee ground material ° New, significant abdominal pain ° New, significant chest pain or pain under the shoulder blades ° Painful or persistently difficult  swallowing ° New shortness of breath ° Black, tarry-looking stools ° °FOLLOW UP: °If any biopsies were taken you will be contacted by phone or by letter within the next 1-3 weeks.  Call your gastroenterologist if you have not heard about the biopsies in 3 weeks.  °Please also call your gastroenterologist's office with any specific questions about appointments or follow up tests. ° °Monitored Anesthesia Care  °Monitored anesthesia care is an anesthesia service for a medical procedure. Anesthesia is the loss of the ability to feel pain. It is produced by medications called anesthetics. It may affect a small area of your body (local anesthesia), a large area of your body (regional anesthesia), or your entire body (general anesthesia). The need for monitored anesthesia care depends your procedure, your condition, and the potential need for regional or general anesthesia. It is often provided during procedures where:  °· General anesthesia may be needed if there are complications. This is because you need special care when you are under general anesthesia.   °· You will be under local or regional anesthesia. This is so that you are able to have higher levels of anesthesia if needed.   °· You will receive calming medications (sedatives). This is especially the case if sedatives are given to put you in a semi-conscious state of relaxation (deep sedation). This is because the amount of sedative needed to produce this state can be hard to predict. Too much of a sedative can produce general anesthesia. °Monitored anesthesia care   is performed by one or more caregivers who have special training in all types of anesthesia. You will need to meet with these caregivers before your procedure. During this meeting, they will ask you about your medical history. They will also give you instructions to follow. (For example, you will need to stop eating and drinking before your procedure. You may also need to stop or change medications  you are taking.) During your procedure, your caregivers will stay with you. They will:  °· Watch your condition. This includes watching you blood pressure, breathing, and level of pain.   °· Diagnose and treat problems that occur.   °· Give medications if they are needed. These may include calming medications (sedatives) and anesthetics.   °· Make sure you are comfortable.   °Having monitored anesthesia care does not necessarily mean that you will be under anesthesia. It does mean that your caregivers will be able to manage anesthesia if you need it or if it occurs. It also means that you will be able to have a different type of anesthesia than you are having if you need it. When your procedure is complete, your caregivers will continue to watch your condition. They will make sure any medications wear off before you are allowed to go home.  °Document Released: 06/12/2005 Document Revised: 01/11/2013 Document Reviewed: 10/28/2012 °[employer]® Patient Information ©2014 [employer]. ° °

## 2014-02-03 NOTE — Transfer of Care (Signed)
Immediate Anesthesia Transfer of Care Note  Patient: [name] [name]  Procedure(s) Performed: Procedure(s): UPPER ENDOSCOPIC ULTRASOUND (EUS) LINEAR (N/A)  Patient Location: PACU and Endoscopy Unit  Anesthesia Type:MAC  Level of Consciousness: awake and patient cooperative  Airway & Oxygen Therapy: Patient Spontanous Breathing and Patient connected to nasal cannula oxygen  Post-op Assessment: Report given to PACU RN and Post -op Vital signs reviewed and stable  Post vital signs: Reviewed and stable  Complications: No apparent anesthesia complications

## 2014-02-03 NOTE — Interval H&P Note (Signed)
History and Physical Interval Note:  02/03/2014 12:08 PM  [name] [name]  has presented today for surgery, with the diagnosis of Esophageal cancer [150.9]  The various methods of treatment have been discussed with the patient and family. After consideration of risks, benefits and other options for treatment, the patient has consented to  Procedure(s): UPPER ENDOSCOPIC ULTRASOUND (EUS) LINEAR (N/A) as a surgical intervention .  The patient's history has been reviewed, patient examined, no change in status, stable for surgery.  I have reviewed the patient's chart and labs.  Questions were answered to the patient's satisfaction.     Milus Banister

## 2014-02-03 NOTE — Op Note (Signed)
[facility] [place], [address]   ENDOSCOPIC ULTRASOUND PROCEDURE REPORT  PATIENT: [name] [name], [name] [name]  MR#: [id number] BIRTHDATE: 16-Jan-1946  GENDER: Male ENDOSCOPIST: Milus Banister, MD REFERRED BY:  Inda Castle, M.D. PROCEDURE DATE:  02/03/2014 PROCEDURE:   Upper EUS ASA CLASS:      Class III INDICATIONS:   recently diagnosed esophageal adenocarcinoma, CT scan suggested possible nearby adenopathy but no clear distant spread MEDICATIONS: MAC sedation, administered by CRNA  DESCRIPTION OF PROCEDURE:   After the risks benefits and alternatives of the procedure were  explained, informed consent was obtained. The patient was then placed in the left, lateral, decubitus postion and IV sedation was administered. Throughout the procedure, the patients blood pressure, pulse and oxygen saturations were monitored continuously.  Under direct visualization, the Pentax Radial EUS [id number]  endoscope was introduced through the mouth  and advanced to the malignant stricture in distal esophagus.  Water was used as necessary to provide an acoustic interface.  Upon completion of the imaging, water was removed and the patient was sent to the recovery room in satisfactory condition.   Endoscopic findings: 1. Clearly malignant, circumferential mass in distal esophagus. The proximal edge of the mass was a 35cm from the incisors. The mass is causing a stricture which was to narrow to allow for passage of the echoendoscope.  EUS findings: 1. The mass above was evaluated at it's proximal aspect only, the narrowing which it caused did not allow for EUS evaluation of the distal aspect of the mass or of the gastohepatic adenopathy.  The mass was hypoechoic, clearly passed into and through the muscularis propria layer of the esophageal wall (uT3). 2. There were three suspicious lymphnodes that lay very near the mass, ranging in size from 60mm to 71mm.  These were round,  well demarcated, hypoechoic. (uN1).  Impression: This was an incomplete evaluation of the tumor given that the echoendoscope could not pass distal to the maligant stricture. This limited evaluation reveals a uT3N1 distal esophagus adenocarcinoma with proximal edge located 35cm from the incisors. The gastrohepatic ligament adenopathy was not visible, upcoming PET scan can help determine if it likely harbors malignancy.   _______________________________ eSignedMilus Banister, MD 02/03/2014 12:39 PM   cc: Curt Bears, MD

## 2014-02-04 ENCOUNTER — Encounter (HOSPITAL_COMMUNITY): Payer: Self-pay | Admitting: Gastroenterology

## 2014-02-04 NOTE — Anesthesia Postprocedure Evaluation (Signed)
  Anesthesia Post-op Note  Patient: [name] [name]  Procedure(s) Performed: Procedure(s) (LRB): UPPER ENDOSCOPIC ULTRASOUND (EUS) LINEAR (N/A)  Patient Location: PACU  Anesthesia Type: MAC  Level of Consciousness: awake and alert   Airway and Oxygen Therapy: Patient Spontanous Breathing  Post-op Pain: mild  Post-op Assessment: Post-op Vital signs reviewed, Patient's Cardiovascular Status Stable, Respiratory Function Stable, Patent Airway and No signs of Nausea or vomiting  Last Vitals:  Filed Vitals:   02/03/14 1300  BP: 109/69  Pulse: 63  Temp:   Resp: 21    Post-op Vital Signs: stable   Complications: No apparent anesthesia complications

## 2014-02-08 ENCOUNTER — Encounter: Payer: Self-pay | Admitting: Radiation Oncology

## 2014-02-08 ENCOUNTER — Ambulatory Visit
Admission: RE | Admit: 2014-02-08 | Discharge: 2014-02-08 | Disposition: A | Discharge status: Home / Self Care | Payer: Medicare Other | Source: Ambulatory Visit | Attending: Radiation Oncology | Admitting: Radiation Oncology

## 2014-02-08 VITALS — BP 94/54 | HR 77 | Temp 98.9°F | Resp 20 | Ht 66.0 in | Wt 208.6 lb

## 2014-02-08 DIAGNOSIS — E785 Hyperlipidemia, unspecified: Secondary | ICD-10-CM | POA: Diagnosis not present

## 2014-02-08 DIAGNOSIS — I10 Essential (primary) hypertension: Secondary | ICD-10-CM | POA: Diagnosis not present

## 2014-02-08 DIAGNOSIS — R1310 Dysphagia, unspecified: Secondary | ICD-10-CM | POA: Diagnosis not present

## 2014-02-08 DIAGNOSIS — Z803 Family history of malignant neoplasm of breast: Secondary | ICD-10-CM | POA: Diagnosis not present

## 2014-02-08 DIAGNOSIS — C160 Malignant neoplasm of cardia: Secondary | ICD-10-CM | POA: Diagnosis not present

## 2014-02-08 DIAGNOSIS — C155 Malignant neoplasm of lower third of esophagus: Secondary | ICD-10-CM

## 2014-02-08 DIAGNOSIS — Z87891 Personal history of nicotine dependence: Secondary | ICD-10-CM | POA: Diagnosis not present

## 2014-02-08 DIAGNOSIS — Z79899 Other long term (current) drug therapy: Secondary | ICD-10-CM | POA: Diagnosis not present

## 2014-02-08 DIAGNOSIS — Z510 Encounter for antineoplastic radiation therapy: Secondary | ICD-10-CM | POA: Diagnosis present

## 2014-02-08 DIAGNOSIS — C159 Malignant neoplasm of esophagus, unspecified: Secondary | ICD-10-CM

## 2014-02-08 DIAGNOSIS — K219 Gastro-esophageal reflux disease without esophagitis: Secondary | ICD-10-CM | POA: Diagnosis not present

## 2014-02-08 NOTE — Progress Notes (Signed)
Please see the Nurse Progress Note in the MD Initial Consult Encounter for this patient. 

## 2014-02-08 NOTE — Addendum Note (Signed)
Encounter addended by: Andria Rhein, RN on: 02/08/2014  9:11 AM<BR>     Documentation filed: Problem List, Charges VN

## 2014-02-08 NOTE — Progress Notes (Signed)
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 male who is seen today through the courtesy of Dr. Curt Bears for evaluation of his clinical stage T3 N2 MX adenocarcinoma of the distal esophagus. He presented with a one-month history of progressive dysphagia along with a 50 pound weight loss. He was seen by Dr. [name] who performed endoscopy on 01/26/2014 noting an exophytic mass at approximately 35 cm from the incisors with the mass extending to the GE junction with there was a moderate stricture. Biopsies showed adenocarcinoma. His staging workup included CT scans of the chest and abdomen which showed marked irregular masslike thickening immediately above the GE junction with a 1.6 cm gastrohepatic ligament lymph node concerning for local metastatic disease. 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 There were smaller nodes seen along the subcarinal region and interstitial pneumonia picture within the lungs. The patient was seen by Dr. Ardis Hughs who performed an esophageal ultrasound with tumor seen through the muscularis and also 3 suspicious lymph nodes near the mass measuring from 0.5 -0.8 cm. The patient was seen by [name] who has him scheduled for a PET scan this Friday to complete his staging workup. He is being considered for either preoperative chemoradiation or definitive chemoradiation depending on his clinical stage following his PET scan. 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 To my knowledge she has not had a rheumatologic evaluation. He does not give a history of Raynaud's. He states that his weight is now stable in his dysphagia is improved following his endoscopies. He is able to the soft tissues including vegetables, and he is supplementing his diet with instant breakfast and Ensure.  PREVIOUS RADIATION THERAPY: No   PAST MEDICAL HISTORY:  has a past medical history of GERD (gastroesophageal reflux disease); Hypertension; Hyperlipemia; Esophageal cancer (01/26/14); and Barrett's esophagus (12/2013).     PAST SURGICAL HISTORY:  Past Surgical History  Procedure Laterality Date  . Eus N/A 02/03/2014    Procedure: UPPER ENDOSCOPIC ULTRASOUND (EUS) LINEAR;  Surgeon: Milus Banister, MD;  Location: [facility];  Service: Endoscopy;  Laterality: N/A;     FAMILY HISTORY: family history includes Breast cancer in his sister. His father died at 61, and his mother died at age 102, unknown etiologies.   SOCIAL HISTORY:  reports that he quit smoking about 15 years ago. He has never used smokeless tobacco. He reports that he drinks alcohol. He reports that he does not use illicit drugs. Divorced, one child. 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   ALLERGIES: Review of patient's allergies indicates no known allergies.   MEDICATIONS:  Current Outpatient Prescriptions  Medication Sig Dispense Refill  . Ascorbic Acid (VITAMIN C) 1000 MG tablet Take 1,000 mg by mouth daily.      . cloNIDine (CATAPRES) 0.1 MG tablet Take 0.1 mg by mouth every morning.       . dutasteride (AVODART)  0.5 MG capsule Take 0.5 mg by mouth every evening.       . fexofenadine (ALLEGRA) 180 MG tablet Take 180 mg by mouth daily.      . Garlic 5009 MG CAPS Take 1 capsule by mouth daily.      Marland Kitchen omega-3 acid ethyl esters (LOVAZA) 1 G capsule Take 1 g by mouth daily.      Marland Kitchen omeprazole (PRILOSEC) 40 MG capsule Take 40 mg by mouth daily.      .  simvastatin (ZOCOR) 40 MG tablet Take 40 mg by mouth daily.      . valsartan-hydrochlorothiazide (DIOVAN-HCT) 160-25 MG per tablet Take 1 tablet by mouth every morning.       . vitamin E (VITAMIN E) 1000 UNIT capsule Take 1,000 Units by mouth daily.       No current facility-administered medications for this encounter.     REVIEW OF SYSTEMS:  Pertinent items are noted in HPI.    PHYSICAL EXAM:  height is 5\' 6"  (1.676 m) and weight is 208 lb 9.6 oz (94.62 kg). His oral temperature is 98.9 F (37.2 C). His blood pressure is 94/54 and his pulse is 77. His respiration is 20.   Alert and oriented 68 year old white male appearing slightly than his stated age. He walks with a limp. Head and neck examination: Grossly unremarkable. Nodes: There is no cervical or supraclavicular lymphadenopathy. Chest: Lungs clear. Heart: Regular rate and rhythm. Back: Without spinal or CVA tenderness. Abdomen: Without masses organomegaly. 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   LABORATORY DATA:  Lab Results  Component Value Date   WBC 4.7 02/01/2014   HGB 10.5* 02/01/2014   HCT 33.2* 02/01/2014   MCV 82.6 02/01/2014   PLT 274 02/01/2014   Lab Results  Component Value Date   NA 142 02/01/2014   K 4.0 02/01/2014   CL 104 01/26/2014   CO2 27 02/01/2014   Lab Results  Component Value Date   ALT <6 02/01/2014   AST 10 02/01/2014   ALKPHOS 72 02/01/2014   BILITOT 0.79 02/01/2014      IMPRESSION: Minimal stage III (T3 N2 MX) adenocarcinoma of the distal esophagus. He has locally advanced disease at the very least. I'm not sure if his performance status would allow for surgical resection following possible preoperative chemoradiation. He will have a PET scan to complete his staging workup. I do feel that he should see rheumatology to see if he actually has scleroderma in view of his CT scan findings of interstitial pneumonia and achalasia and clinical findings for mild sclerodactyly. If he has scleroderma he  may be at increased risk for both acute and late toxicity including dermatitis, esophagitis, and late fibrosis. One has to weigh the risks of that treatment compared to the potential benefits. Treatment in this case may outweigh potential toxicity. I will share my thoughts with [name]. His PET scan this Friday will complete his staging workup.   PLAN: As discussed above. I will formalize implant with [name] following his PET scan.  I spent 60 minutes minutes face to face with the patient and more than 50% of that time was spent in counseling and/or coordination of care.

## 2014-02-09 NOTE — Addendum Note (Signed)
Encounter addended by: Kalyna Paolella Mintz Timberlynn Kizziah, RN on: 02/09/2014  6:59 PM<BR>     Documentation filed: Charges VN

## 2014-02-11 ENCOUNTER — Telehealth: Payer: Self-pay | Admitting: Internal Medicine

## 2014-02-11 ENCOUNTER — Ambulatory Visit (HOSPITAL_COMMUNITY)
Admission: RE | Admit: 2014-02-11 | Discharge: 2014-02-11 | Disposition: A | Discharge status: Home / Self Care | Payer: Medicare Other | Source: Ambulatory Visit | Attending: Internal Medicine | Admitting: Internal Medicine

## 2014-02-11 ENCOUNTER — Encounter: Payer: Self-pay | Admitting: Internal Medicine

## 2014-02-11 ENCOUNTER — Ambulatory Visit (HOSPITAL_BASED_OUTPATIENT_CLINIC_OR_DEPARTMENT_OTHER): Payer: Medicare Other | Admitting: Internal Medicine

## 2014-02-11 VITALS — BP 126/66 | HR 70 | Temp 97.5°F | Resp 18 | Ht 66.0 in | Wt 209.7 lb

## 2014-02-11 DIAGNOSIS — C159 Malignant neoplasm of esophagus, unspecified: Secondary | ICD-10-CM

## 2014-02-11 DIAGNOSIS — K228 Other specified diseases of esophagus: Secondary | ICD-10-CM | POA: Insufficient documentation

## 2014-02-11 DIAGNOSIS — I10 Essential (primary) hypertension: Secondary | ICD-10-CM

## 2014-02-11 DIAGNOSIS — C155 Malignant neoplasm of lower third of esophagus: Secondary | ICD-10-CM

## 2014-02-11 DIAGNOSIS — K2289 Other specified disease of esophagus: Secondary | ICD-10-CM | POA: Insufficient documentation

## 2014-02-11 LAB — GLUCOSE, CAPILLARY: Glucose-Capillary: 96 mg/dL (ref 70–99)

## 2014-02-11 MED ORDER — FLUDEOXYGLUCOSE F - 18 (FDG) INJECTION
10.3000 | Freq: Once | INTRAVENOUS | Status: AC | PRN
  Administered 2014-02-11: 10.3 via INTRAVENOUS

## 2014-02-11 NOTE — Progress Notes (Signed)
[facility] Telephone:([phone]) [phone]   Fax:([phone]) [place], MD [place] [address]  DIAGNOSIS: Locally advanced, stage IIIB (T3, N2, M0) distal esophageal adenocarcinoma diagnosed in May of 2015  PRIOR THERAPY: None  CURRENT THERAPY: Concurrent chemoradiation with weekly carboplatin for AUC of 2 and paclitaxel 45 mg/M2. First dose expected 02/28/2014.  INTERVAL HISTORY: [name] [name] 68 y.o. male returns to the clinic today for followup visit accompanied by his daughter. The patient is feeling fine today with no specific complaints. He denied having any significant chest pain, shortness breath, cough or hemoptysis. He continues to have dysphagia mucositis but able to maintain his weight with liquid. The patient denied having any significant nausea or vomiting, no fever or chills. He had a PET scan performed earlier today and he is here for evaluation and discussion of his treatment options. He was also seen by Dr. Ardis Hughs and underwent endoscopic ultrasound of the esophagus which was incomplete evaluation of the tumor given that the echoendoscope could not pass distal to the malignant stricture and a limited evaluation reveals uT3 N1 distal esophageal adenocarcinoma.  MEDICAL HISTORY: Past Medical History  Diagnosis Date  . GERD (gastroesophageal reflux disease)   . Hypertension   . Hyperlipemia   . 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 Barrett's esophagus 12/2013    ALLERGIES:  has No Known Allergies.  MEDICATIONS:  Current Outpatient Prescriptions  Medication Sig Dispense Refill  . Ascorbic Acid (VITAMIN C) 1000 MG tablet Take 1,000 mg by mouth daily.      . cloNIDine (CATAPRES) 0.1 MG tablet Take 0.1 mg by mouth every morning.       . dutasteride (AVODART) 0.5 MG capsule Take 0.5 mg by mouth every evening.       . fexofenadine (ALLEGRA) 180 MG tablet Take 180 mg by mouth daily.       . Garlic 5956 MG CAPS Take 1 capsule by mouth daily.      Marland Kitchen omega-3 acid ethyl esters (LOVAZA) 1 G capsule Take 1 g by mouth daily.      Marland Kitchen omeprazole (PRILOSEC) 40 MG capsule Take 40 mg by mouth daily.      . simvastatin (ZOCOR) 40 MG tablet Take 40 mg by mouth daily.      . valsartan-hydrochlorothiazide (DIOVAN-HCT) 160-25 MG per tablet Take 1 tablet by mouth every morning.       . vitamin E (VITAMIN E) 1000 UNIT capsule Take 1,000 Units by mouth daily.       No current facility-administered medications for this visit.    SURGICAL HISTORY:  Past Surgical History  Procedure Laterality Date  . Eus N/A 02/03/2014    Procedure: UPPER ENDOSCOPIC ULTRASOUND (EUS) LINEAR;  Surgeon: Milus Banister, MD;  Location: [facility];  Service: Endoscopy;  Laterality: N/A;    REVIEW OF SYSTEMS:  Constitutional: positive for fatigue Eyes: negative Ears, nose, mouth, throat, and face: negative Respiratory: negative Cardiovascular: negative Gastrointestinal: positive for dysphagia Genitourinary:negative Integument/breast: negative Hematologic/lymphatic: negative Musculoskeletal:negative Neurological: negative Behavioral/Psych: negative Endocrine: negative Allergic/Immunologic: negative   PHYSICAL EXAMINATION: General appearance: alert, cooperative, fatigued and no distress Head: Normocephalic, without obvious abnormality, atraumatic Neck: no adenopathy, no JVD, supple, symmetrical, trachea midline and thyroid not enlarged, symmetric, no tenderness/mass/nodules Lymph nodes: Cervical, supraclavicular, and axillary nodes normal. Resp: clear to auscultation bilaterally Back: symmetric, no curvature. ROM normal. No CVA tenderness. Cardio: regular rate  and rhythm, S1, S2 normal, no murmur, click, rub or gallop GI: soft, non-tender; bowel sounds normal; no masses,  no organomegaly Extremities: extremities normal, atraumatic, no cyanosis or edema Neurologic: Alert and oriented X 3, normal strength  and tone. Normal symmetric reflexes. Normal coordination and gait  ECOG PERFORMANCE STATUS: 1 - Symptomatic but completely ambulatory  Blood pressure 126/66, pulse 70, temperature 97.5 F (36.4 C), temperature source Oral, resp. rate 18, height 5\' 6"  (1.676 m), weight 209 lb 11.2 oz (95.119 kg), SpO2 100.00%.  LABORATORY DATA: Lab Results  Component Value Date   WBC 4.7 02/01/2014   HGB 10.5* 02/01/2014   HCT 33.2* 02/01/2014   MCV 82.6 02/01/2014   PLT 274 02/01/2014      Chemistry      Component Value Date/Time   NA 142 02/01/2014 1302   NA 138 01/26/2014 1148   K 4.0 02/01/2014 1302   K 3.9 01/26/2014 1148   CL 104 01/26/2014 1148   CO2 27 02/01/2014 1302   CO2 29 01/26/2014 1148   BUN 9.6 02/01/2014 1302   BUN 11 01/26/2014 1148   CREATININE 1.1 02/01/2014 1302   CREATININE 1.0 01/26/2014 1148      Component Value Date/Time   CALCIUM 9.5 02/01/2014 1302   CALCIUM 8.8 01/26/2014 1148   ALKPHOS 72 02/01/2014 1302   ALKPHOS 62 01/26/2014 1148   AST 10 02/01/2014 1302   AST 12 01/26/2014 1148   ALT <6 02/01/2014 1302   ALT 9 01/26/2014 1148   BILITOT 0.79 02/01/2014 1302   BILITOT 0.6 01/26/2014 1148       RADIOGRAPHIC STUDIES: Ct Chest W Contrast  01/28/2014   CLINICAL DATA:  History of esophageal mass in the distal esophagus. Evaluate for metastatic disease.  EXAM: CT CHEST, ABDOMEN, AND PELVIS WITH CONTRAST  TECHNIQUE: Multidetector CT imaging of the chest, abdomen and pelvis was performed following the standard protocol during bolus administration of intravenous contrast.  CONTRAST:  100 mL of Omnipaque 300.  COMPARISON:  No priors.  FINDINGS: CT CHEST FINDINGS  Mediastinum: Marked irregular thickening of the distal esophagus at and immediately above the gastroesophageal junction, corresponds to known esophageal mass. Proximal to this, the esophagus is markedly dilated, with a a large air-fluid level. Prominent low right paratracheal lymph node measures 8 mm in short axis. Subcarinal lymph node measuring  9 mm in short axis. No definite mediastinal or hilar lymph nodes meet criteria for pathologic enlargement at this time. Heart size is normal. There is no significant pericardial fluid, thickening or pericardial calcification.  Lungs/Pleura: Throughout the lung bases bilaterally there is widespread ground-glass attenuation with associated septal thickening, thickening of the peribronchovascular interstitium and mild cylindrical bronchiectasis. 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 No acute consolidative airspace disease. No pleural effusions. Multiple tiny calcified granulomas throughout the lung bases bilaterally.  Musculoskeletal: There are no aggressive appearing lytic or blastic lesions noted in the visualized portions of the skeleton.  CT ABDOMEN AND PELVIS FINDINGS  Abdomen/Pelvis: Enlarged lymph node in the gastrohepatic ligament measuring 16 mm in short axis (image 55 of series 2), concerning for nodal metastasis. The thickening of the gastroesophageal junction does not involve the more distal portions of the stomach. The appearance of the liver, gallbladder, pancreas, spleen and bilateral adrenal glands is unremarkable. Sub cm low-attenuation lesions in the kidneys bilaterally are too small to definitively characterize, but are favored to represent tiny cysts. Normal appendix. No significant volume of ascites. No pneumoperitoneum. No  pathologic distention of small bowel. Prostate gland and urinary bladder are unremarkable in appearance.  Musculoskeletal: There are no aggressive appearing lytic or blastic lesions noted in the visualized portions of the skeleton.  IMPRESSION: 1. Marked irregular masslike thickening at an immediately above the gastroesophageal junction, compatible with the reported esophageal neoplasm. 16 mm short axis gastrohepatic ligament lymph node is concerning for a local nodal metastasis. Note definite signs of distal metastatic disease in the  chest, abdomen or pelvis on today's examination. 2. 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 Rheumatologic consultation is suggested. 3. Additional incidental findings, as above.   Electronically Signed   By: Vinnie Langton M.D.   On: 01/28/2014 14:18   Nm Pet Image Initial (pi) Skull Base To Thigh  02/11/2014   CLINICAL DATA:  Initial treatment strategy for esophageal cancer.  EXAM: NUCLEAR MEDICINE PET SKULL BASE TO THIGH  TECHNIQUE: 10.3 mCi F-18 FDG was injected intravenously. Full-ring PET imaging was performed from the skull base to thigh after the radiotracer. CT data was obtained and used for attenuation correction and anatomic localization.  FASTING BLOOD GLUCOSE:  Value: 96 mg/dl  COMPARISON:  None.  FINDINGS: NECK  No hypermetabolic lymph nodes in the neck.  CHEST  Dilated esophagus is identified. Distal esophageal mass measures 3.1 x 4.4 x 6 cm. The SUV max associated with this mass is equal to 9.7. There is mild increased radiotracer uptake associated with the sub- carinal lymph node. This has an SUV max equal to 3.7. There is no hypermetabolic pulmonary nodule or mass identified. No suspicious pulmonary nodules on the CT scan.  ABDOMEN/PELVIS  Mild increased FDG uptake is associated with the gastrohepatic ligament lymph node. The SUV max is equal to 2.9. Moderate hypermetabolic peripancreatic lymph node has an SUV max equal to 5.2. Moderate increased FDG uptake is associated with the portacaval lymph node. This has an SUV max equal to 5.2. Within the lateral segment of left hepatic lobe there is a focus of mild increased radiotracer  uptake within SUV max equal to 3.8. No corresponding abnormality is noted on the CT images. Calcified atherosclerotic disease involves the abdominal aorta. No aneurysm.  SKELETON  Solitary focus of mild increased radiotracer uptake localizing to lateral aspect of the right fourth rib has an SUV max equal to 2.8. No corresponding CT abnormality is identified.  IMPRESSION: 1. Large, intense focus of increased radiotracer uptake localizing to the distal esophagus compatible with primary esophageal neoplasm. 2. Malignant range FDG uptake is associated with the upper abdominal lymph nodes in the gastrohepatic ligament and portacaval region which is suspicious for metastatic adenopathy. There is mild, equivocal FDG uptake associated with the sub- carinal lymph node. 3. There is an equivocal focal of increased uptake localizing to the lateral segment of left hepatic lobe. A contrast enhanced MRI of the liver may provide a more sensitive assessment for early liver metastasis. 4. Solitary focus of increased uptake localizing to the lateral aspect of the right fourth rib is nonspecific. Although this this may be posttraumatic, bone metastasis is not excluded. No corresponding fracture is identified on the CT images.   Electronically Signed   By: Kerby Moors M.D.   On: 02/11/2014  10:07   ASSESSMENT AND PLAN: This is a very pleasant 68 years old [place] American male recently diagnosed with locally advanced esophageal adenocarcinoma. The recent PET scan showed malignant range FDG uptake associated with the upper abdominal lymph nodes in the gastrohepatic ligament and portacaval region suspicious for metastatic adenopathy with equivocal FDG uptake in the subcarinal lymph nodes. There was also concern about increased uptake in the lateral segment of the left hepatic lobe. I have a lengthy discussion with the patient and his daughter today about his current disease status and treatment options. I recommended for the patient  a course of concurrent chemoradiation with weekly carboplatin for AUC of 2 and paclitaxel 45 mg/M2 for a total of 5 weeks. He is expected to start the first cycle of this treatment on 02/28/2014. This would be followed by restaging PET scan and this is no evidence for metastatic disease as well as improvement in the locally advanced disease, he may be considered for surgical resection. Otherwise we will proceed with more systemic chemotherapy. I discussed with the patient adverse effect of the chemotherapy including but not limited to alopecia, myelosuppression, nausea and vomiting, peripheral neuropathy, liver or renal dysfunction. He would have a chemotherapy education class before starting the first dose of his chemotherapy. I will prescribe Compazine 10 mg by mouth every 6 hours as needed for nausea. He would come back for followup visit in 2 weeks for reevaluation and management any adverse effect of his treatment. The patient voices understanding of current disease status and treatment options and is in agreement with the current care plan.  All questions were answered. The patient knows to call the clinic with any problems, questions or concerns. We can certainly see the patient much sooner if necessary.  I spent 20 minutes counseling the patient face to face. The total time spent in the appointment was 30 minutes.  Disclaimer: This note was dictated with voice recognition software. Similar sounding words can inadvertently be transcribed and may not be corrected upon review.

## 2014-02-11 NOTE — Telephone Encounter (Signed)
gv adn printed appt sched and avs for pt for May and June....sed added tx. °

## 2014-02-14 ENCOUNTER — Ambulatory Visit
Admission: RE | Admit: 2014-02-14 | Discharge: 2014-02-14 | Disposition: A | Discharge status: Home / Self Care | Payer: Medicare Other | Source: Ambulatory Visit | Attending: Radiation Oncology | Admitting: Radiation Oncology

## 2014-02-14 DIAGNOSIS — C159 Malignant neoplasm of esophagus, unspecified: Secondary | ICD-10-CM

## 2014-02-14 DIAGNOSIS — Z510 Encounter for antineoplastic radiation therapy: Secondary | ICD-10-CM | POA: Diagnosis not present

## 2014-02-14 NOTE — Progress Notes (Signed)
Complex simulation/treatment planning note: The patient was taken to the CT simulator. A Vac lock immobilization device was constructed. He was given barium to swallow. He was then scanned. I chose and isocenter along his distal esophagus/tumor mass. The CT data set was sent to [facility] where I contoured CTV 1 and expanded this by 0.8 cm to create PTV 1. PTV 1 will receive 5040 cGy in 28 sessions. The patient will receive concomitant chemotherapy in this constitutes a special treatment procedure because the increased risk for esophagitis and also pancytopenia. I requesting daily cone beam CT setting up to his spine and distal esophagus. He is now ready for 3-D simulation.

## 2014-02-16 ENCOUNTER — Ambulatory Visit: Payer: Medicare Other

## 2014-02-17 ENCOUNTER — Encounter: Payer: Self-pay | Admitting: Radiation Oncology

## 2014-02-17 DIAGNOSIS — Z510 Encounter for antineoplastic radiation therapy: Secondary | ICD-10-CM | POA: Diagnosis not present

## 2014-02-17 NOTE — Progress Notes (Signed)
3-D simulation note: The patient completed 3-D simulation in the management of his carcinoma of the distal esophagus. Dose volume histograms were obtained for the target structures in addition to avoidance structures including the heart, lungs, and spinal cord. He met our departmental guidelines. He was set up to a 4 field technique with for unique multileaf collimators. I prescribing 5040 cGy 28 sessions utilizing 15 MV photons. He'll receive concomitant chemotherapy in this constitutes a special treatment procedure. We can expect increased GI toxicity and also hematologic toxicity. I requesting daily cone beam CT for image guidance along with align RT.

## 2014-02-22 ENCOUNTER — Ambulatory Visit: Payer: Medicare Other | Admitting: Radiation Oncology

## 2014-02-22 ENCOUNTER — Ambulatory Visit
Admission: RE | Admit: 2014-02-22 | Discharge: 2014-02-22 | Disposition: A | Discharge status: Home / Self Care | Payer: Medicare Other | Source: Ambulatory Visit | Attending: Radiation Oncology | Admitting: Radiation Oncology

## 2014-02-22 DIAGNOSIS — C159 Malignant neoplasm of esophagus, unspecified: Secondary | ICD-10-CM

## 2014-02-22 DIAGNOSIS — Z510 Encounter for antineoplastic radiation therapy: Secondary | ICD-10-CM | POA: Diagnosis not present

## 2014-02-22 NOTE — Progress Notes (Signed)
Simulation verification note: The patient underwent simulation verification for treatment to his distal esophagus/regional lymph nodes. His isocenter is in good position and the multileaf collimators contoured the treatment volume appropriately.

## 2014-02-23 ENCOUNTER — Ambulatory Visit
Admission: RE | Admit: 2014-02-23 | Discharge: 2014-02-23 | Disposition: A | Discharge status: Home / Self Care | Payer: Medicare Other | Source: Ambulatory Visit | Attending: Radiation Oncology | Admitting: Radiation Oncology

## 2014-02-23 ENCOUNTER — Encounter: Payer: Self-pay | Admitting: *Deleted

## 2014-02-23 DIAGNOSIS — Z510 Encounter for antineoplastic radiation therapy: Secondary | ICD-10-CM | POA: Diagnosis not present

## 2014-02-23 DIAGNOSIS — C159 Malignant neoplasm of esophagus, unspecified: Secondary | ICD-10-CM

## 2014-02-23 DIAGNOSIS — C155 Malignant neoplasm of lower third of esophagus: Secondary | ICD-10-CM | POA: Insufficient documentation

## 2014-02-23 NOTE — Progress Notes (Signed)
Post sim ed completed w/pt and wife. Gave pt "Radiation and You" booklet w/all pertinent information marked and discussed, re: diarrhea, fatigue, nausea/vomiting, skin irritation/care, throat irritation/management, nutrition, pain. Pt and wife verbalized understanding. Left vm for B Neff, nutritionist to schedule appt w/pt. Pt is drinking Ensure, 1-2 cans daily.

## 2014-02-24 ENCOUNTER — Telehealth: Payer: Self-pay | Admitting: *Deleted

## 2014-02-24 ENCOUNTER — Ambulatory Visit
Admission: RE | Admit: 2014-02-24 | Discharge: 2014-02-24 | Disposition: A | Discharge status: Home / Self Care | Payer: Medicare Other | Source: Ambulatory Visit | Attending: Radiation Oncology | Admitting: Radiation Oncology

## 2014-02-24 DIAGNOSIS — Z510 Encounter for antineoplastic radiation therapy: Secondary | ICD-10-CM | POA: Diagnosis not present

## 2014-02-24 NOTE — Telephone Encounter (Signed)
CALLED PATIENT TO INFORM OF NUTRITION APPT. ON 03-03-14, LVM FOR A RETURN CALL

## 2014-02-25 ENCOUNTER — Ambulatory Visit
Admission: RE | Admit: 2014-02-25 | Discharge: 2014-02-25 | Disposition: A | Discharge status: Home / Self Care | Payer: Medicare Other | Source: Ambulatory Visit | Attending: Radiation Oncology | Admitting: Radiation Oncology

## 2014-02-25 DIAGNOSIS — Z510 Encounter for antineoplastic radiation therapy: Secondary | ICD-10-CM | POA: Diagnosis not present

## 2014-02-28 ENCOUNTER — Other Ambulatory Visit: Payer: Self-pay | Admitting: *Deleted

## 2014-02-28 ENCOUNTER — Ambulatory Visit (HOSPITAL_BASED_OUTPATIENT_CLINIC_OR_DEPARTMENT_OTHER): Payer: Medicare Other

## 2014-02-28 ENCOUNTER — Encounter: Payer: Self-pay | Admitting: Radiation Oncology

## 2014-02-28 ENCOUNTER — Telehealth: Payer: Self-pay | Admitting: *Deleted

## 2014-02-28 ENCOUNTER — Ambulatory Visit
Admission: RE | Admit: 2014-02-28 | Discharge: 2014-02-28 | Disposition: A | Discharge status: Home / Self Care | Payer: Medicare Other | Source: Ambulatory Visit | Attending: Radiation Oncology | Admitting: Radiation Oncology

## 2014-02-28 ENCOUNTER — Other Ambulatory Visit: Payer: Medicare Other

## 2014-02-28 ENCOUNTER — Other Ambulatory Visit (HOSPITAL_BASED_OUTPATIENT_CLINIC_OR_DEPARTMENT_OTHER): Payer: Medicare Other

## 2014-02-28 VITALS — BP 102/61 | HR 74 | Temp 98.1°F | Resp 20 | Wt 203.9 lb

## 2014-02-28 VITALS — BP 121/71 | HR 68 | Temp 97.9°F | Resp 18

## 2014-02-28 DIAGNOSIS — C155 Malignant neoplasm of lower third of esophagus: Secondary | ICD-10-CM

## 2014-02-28 DIAGNOSIS — C159 Malignant neoplasm of esophagus, unspecified: Secondary | ICD-10-CM

## 2014-02-28 DIAGNOSIS — Z5111 Encounter for antineoplastic chemotherapy: Secondary | ICD-10-CM

## 2014-02-28 DIAGNOSIS — Z510 Encounter for antineoplastic radiation therapy: Secondary | ICD-10-CM | POA: Diagnosis not present

## 2014-02-28 LAB — CBC WITH DIFFERENTIAL/PLATELET
BASO%: 0.6 % (ref 0.0–2.0)
BASOS ABS: 0 10*3/uL (ref 0.0–0.1)
EOS%: 3.5 % (ref 0.0–7.0)
Eosinophils Absolute: 0.2 10*3/uL (ref 0.0–0.5)
HEMATOCRIT: 27 % — AB (ref 38.4–49.9)
HEMOGLOBIN: 8.6 g/dL — AB (ref 13.0–17.1)
LYMPH#: 0.4 10*3/uL — AB (ref 0.9–3.3)
LYMPH%: 7 % — ABNORMAL LOW (ref 14.0–49.0)
MCH: 25.2 pg — AB (ref 27.2–33.4)
MCHC: 31.9 g/dL — ABNORMAL LOW (ref 32.0–36.0)
MCV: 79 fL — AB (ref 79.3–98.0)
MONO#: 0.5 10*3/uL (ref 0.1–0.9)
MONO%: 10.1 % (ref 0.0–14.0)
NEUT%: 78.8 % — AB (ref 39.0–75.0)
NEUTROS ABS: 4.2 10*3/uL (ref 1.5–6.5)
Platelets: 364 10*3/uL (ref 140–400)
RBC: 3.42 10*6/uL — ABNORMAL LOW (ref 4.20–5.82)
RDW: 15.5 % — ABNORMAL HIGH (ref 11.0–14.6)
WBC: 5.3 10*3/uL (ref 4.0–10.3)

## 2014-02-28 LAB — COMPREHENSIVE METABOLIC PANEL (CC13)
ALBUMIN: 2.9 g/dL — AB (ref 3.5–5.0)
ALT: 7 U/L (ref 0–55)
AST: 12 U/L (ref 5–34)
Alkaline Phosphatase: 59 U/L (ref 40–150)
Anion Gap: 11 mEq/L (ref 3–11)
BUN: 14.2 mg/dL (ref 7.0–26.0)
CALCIUM: 9 mg/dL (ref 8.4–10.4)
CHLORIDE: 105 meq/L (ref 98–109)
CO2: 23 mEq/L (ref 22–29)
CREATININE: 0.9 mg/dL (ref 0.7–1.3)
Glucose: 94 mg/dl (ref 70–140)
POTASSIUM: 3.7 meq/L (ref 3.5–5.1)
Sodium: 139 mEq/L (ref 136–145)
Total Bilirubin: 0.76 mg/dL (ref 0.20–1.20)
Total Protein: 6.5 g/dL (ref 6.4–8.3)

## 2014-02-28 MED ORDER — DEXAMETHASONE SODIUM PHOSPHATE 20 MG/5ML IJ SOLN
20.0000 mg | Freq: Once | INTRAMUSCULAR | Status: AC
  Administered 2014-02-28: 20 mg via INTRAVENOUS

## 2014-02-28 MED ORDER — DEXAMETHASONE SODIUM PHOSPHATE 20 MG/5ML IJ SOLN
INTRAMUSCULAR | Status: AC
  Filled 2014-02-28: qty 5

## 2014-02-28 MED ORDER — ONDANSETRON 16 MG/50ML IVPB (CHCC)
INTRAVENOUS | Status: AC
  Filled 2014-02-28: qty 16

## 2014-02-28 MED ORDER — PACLITAXEL CHEMO INJECTION 300 MG/50ML
45.0000 mg/m2 | Freq: Once | INTRAVENOUS | Status: AC
  Administered 2014-02-28: 96 mg via INTRAVENOUS
  Filled 2014-02-28: qty 16

## 2014-02-28 MED ORDER — FAMOTIDINE IN NACL 20-0.9 MG/50ML-% IV SOLN
20.0000 mg | Freq: Once | INTRAVENOUS | Status: AC
  Administered 2014-02-28: 20 mg via INTRAVENOUS

## 2014-02-28 MED ORDER — ONDANSETRON 16 MG/50ML IVPB (CHCC)
16.0000 mg | Freq: Once | INTRAVENOUS | Status: AC
Start: 2014-02-28 — End: 2014-02-28
  Administered 2014-02-28: 16 mg via INTRAVENOUS

## 2014-02-28 MED ORDER — DIPHENHYDRAMINE HCL 50 MG/ML IJ SOLN
50.0000 mg | Freq: Once | INTRAMUSCULAR | Status: AC
  Administered 2014-02-28: 50 mg via INTRAVENOUS

## 2014-02-28 MED ORDER — PROCHLORPERAZINE MALEATE 10 MG PO TABS: 10.0000 mg | ORAL_TABLET | Freq: Four times a day (QID) | ORAL | Status: DC | PRN

## 2014-02-28 MED ORDER — SODIUM CHLORIDE 0.9 % IV SOLN
Freq: Once | INTRAVENOUS | Status: AC
Start: 2014-02-28 — End: 2014-02-28
  Administered 2014-02-28: 12:00:00 via INTRAVENOUS

## 2014-02-28 MED ORDER — CARBOPLATIN CHEMO INJECTION 450 MG/45ML
223.0000 mg | Freq: Once | INTRAVENOUS | Status: AC
  Administered 2014-02-28: 220 mg via INTRAVENOUS
  Filled 2014-02-28: qty 22

## 2014-02-28 MED ORDER — DIPHENHYDRAMINE HCL 50 MG/ML IJ SOLN
INTRAMUSCULAR | Status: AC
  Filled 2014-02-28: qty 1

## 2014-02-28 MED ORDER — FAMOTIDINE IN NACL 20-0.9 MG/50ML-% IV SOLN
INTRAVENOUS | Status: AC
  Filled 2014-02-28: qty 50

## 2014-02-28 NOTE — Patient Instructions (Signed)
[facility] Discharge Instructions for Patients Receiving Chemotherapy  Today you received the following chemotherapy agents Paclitaxel and Carboplatin.   To help prevent nausea and vomiting after your treatment, we encourage you to take your nausea medication as directed.   If you develop nausea and vomiting that is not controlled by your nausea medication, call the clinic.   BELOW ARE SYMPTOMS THAT SHOULD BE REPORTED IMMEDIATELY:  *FEVER GREATER THAN 100.5 F  *CHILLS WITH OR WITHOUT FEVER  NAUSEA AND VOMITING THAT IS NOT CONTROLLED WITH YOUR NAUSEA MEDICATION  *UNUSUAL SHORTNESS OF BREATH  *UNUSUAL BRUISING OR BLEEDING  TENDERNESS IN MOUTH AND THROAT WITH OR WITHOUT PRESENCE OF ULCERS  *URINARY PROBLEMS  *BOWEL PROBLEMS  UNUSUAL RASH Items with * indicate a potential emergency and should be followed up as soon as possible.  Feel free to call the clinic you have any questions or concerns. The clinic phone number is [phone].    

## 2014-02-28 NOTE — Progress Notes (Signed)
Weekly Management Note:  Site: Distal esophagus/upper abdomen Current Dose:  720  cGy Projected Dose: 5040  cGy  Narrative: The patient is seen today for routine under treatment assessment. CBCT/MVCT images/port films were reviewed. The chart was reviewed.   He is without complaints today. He is scheduled for his first weekly chemotherapy today. No change in his dysphagia. He drinks liquids and can swallow soft foods. His weight is down 6 pounds over the past 2 weeks.  He is to see nutrition later this week.  Physical Examination:  Filed Vitals:   02/28/14 0852  BP: 102/61  Pulse: 74  Temp: 98.1 F (36.7 C)  Resp: 20  .  Weight: 203 lb 14.4 oz (92.488 kg). No change.  Laboratory data: Lab Results  Component Value Date   WBC 5.3 02/28/2014   HGB 8.6* 02/28/2014   HCT 27.0* 02/28/2014   MCV 79.0* 02/28/2014   PLT 364 02/28/2014    Impression: Tolerating radiation therapy well.  Plan: Continue radiation therapy as planned.

## 2014-02-28 NOTE — Progress Notes (Signed)
Pt denies pain, fatigue, loss of appetite. Pt has appointment w/nutritionist on 03/03/14. He has his first chemo today.

## 2014-03-01 ENCOUNTER — Ambulatory Visit
Admission: RE | Admit: 2014-03-01 | Discharge: 2014-03-01 | Disposition: A | Discharge status: Home / Self Care | Payer: Medicare Other | Source: Ambulatory Visit | Attending: Radiation Oncology | Admitting: Radiation Oncology

## 2014-03-01 ENCOUNTER — Telehealth: Payer: Self-pay | Admitting: *Deleted

## 2014-03-01 DIAGNOSIS — Z510 Encounter for antineoplastic radiation therapy: Secondary | ICD-10-CM | POA: Diagnosis not present

## 2014-03-01 NOTE — Telephone Encounter (Signed)
Message copied by Cherylynn Ridges on Tue Mar 01, 2014  5:12 PM ------      Message from: Kellie Simmering A      Created: Mon Feb 28, 2014  3:53 PM      Regarding: chemo follow up       First Paclitaxel/Carboplatin Dr. Inda Merlin. [phone] ------

## 2014-03-01 NOTE — Telephone Encounter (Signed)
Called [name] [name] for chemotherapy F/U.  Patient is doing well.  Denies n/v.  Denies any new side effects or symptoms.  Bowel and bladder is functioning well.  Eating and drinking well and I instructed to drink 64 oz minimum daily or at least the day before, of and after treatment.  Reports he had a long slow treatment and forgot he had a TV available, otherwise the experience went well.  Denies questions at this time and encouraged to call if needed.  Reviewed how to call after hours in the case of an emergency.

## 2014-03-02 ENCOUNTER — Ambulatory Visit
Admission: RE | Admit: 2014-03-02 | Discharge: 2014-03-02 | Disposition: A | Discharge status: Home / Self Care | Payer: Medicare Other | Source: Ambulatory Visit | Attending: Radiation Oncology | Admitting: Radiation Oncology

## 2014-03-02 DIAGNOSIS — Z510 Encounter for antineoplastic radiation therapy: Secondary | ICD-10-CM | POA: Diagnosis not present

## 2014-03-03 ENCOUNTER — Ambulatory Visit
Admission: RE | Admit: 2014-03-03 | Discharge: 2014-03-03 | Disposition: A | Discharge status: Home / Self Care | Payer: Medicare Other | Source: Ambulatory Visit | Attending: Radiation Oncology | Admitting: Radiation Oncology

## 2014-03-03 ENCOUNTER — Ambulatory Visit: Payer: Medicare Other | Admitting: Nutrition

## 2014-03-03 DIAGNOSIS — Z510 Encounter for antineoplastic radiation therapy: Secondary | ICD-10-CM | POA: Diagnosis not present

## 2014-03-03 NOTE — Progress Notes (Signed)
This is a 68 year old male diagnosed with esophageal cancer, patient of [name].   Past medical history includes HTN, HLD, GERD, prostate hypertrophy  Medications include vitamin C, vitamin E, omega-3, simvastatin, garlic, prilosec, compazine  Labs: Reviewed  Height: 66 inches Weight: 201.6 pounds Usual Body Weight: 260 pounds BMI: 32.5  Patient presents for a nutrition assessment. Patient on radiation and chemotherapy. First chemo cycle started June 1. Patient reports no changes in appetite since starting treatment. Denies nausea. He has been having swallowing difficulty for the last 3-4 months, but this has improved. He is able to tolerate soft, well-cooked foods. He lost about 60 pounds due to inability to swallow, and he continues to lose weight rapidly. His doctor recommended Ensure shakes to him, but he states that these are too expensive for him.   Nutrition diagnosis: Unintentional weight loss related to swallowing difficulty as evidenced by 60 pound weight loss in 3-4 months.   Intervention: Patient educated on eating small, frequent meals and snacks, using strategies to increase calories and protein. We also discussed lower cost nutrition supplements, including Carnation Instant Breakfast. He was advised to drink these 2-3 times daily. Patient educated on nutrition strategies for swallowing difficulty. Teach back method used.  He was given a case of Ensure Plus shakes.   Monitoring, evaluation, goals: Patient will tolerate adequate calories and protein to maintain weight >200 pounds.   Next visit: Patient given contact number and will call with questions.

## 2014-03-04 ENCOUNTER — Ambulatory Visit
Admission: RE | Admit: 2014-03-04 | Discharge: 2014-03-04 | Disposition: A | Discharge status: Home / Self Care | Payer: Medicare Other | Source: Ambulatory Visit | Attending: Radiation Oncology | Admitting: Radiation Oncology

## 2014-03-04 DIAGNOSIS — Z510 Encounter for antineoplastic radiation therapy: Secondary | ICD-10-CM | POA: Diagnosis not present

## 2014-03-07 ENCOUNTER — Other Ambulatory Visit: Payer: Self-pay | Admitting: *Deleted

## 2014-03-07 ENCOUNTER — Telehealth: Payer: Self-pay | Admitting: Internal Medicine

## 2014-03-07 ENCOUNTER — Telehealth: Payer: Self-pay | Admitting: *Deleted

## 2014-03-07 ENCOUNTER — Encounter: Payer: Self-pay | Admitting: Radiation Oncology

## 2014-03-07 ENCOUNTER — Other Ambulatory Visit (HOSPITAL_BASED_OUTPATIENT_CLINIC_OR_DEPARTMENT_OTHER): Payer: Medicare Other

## 2014-03-07 ENCOUNTER — Ambulatory Visit (HOSPITAL_BASED_OUTPATIENT_CLINIC_OR_DEPARTMENT_OTHER): Payer: Medicare Other

## 2014-03-07 ENCOUNTER — Ambulatory Visit
Admission: RE | Admit: 2014-03-07 | Discharge: 2014-03-07 | Disposition: A | Discharge status: Home / Self Care | Payer: Medicare Other | Source: Ambulatory Visit | Attending: Radiation Oncology | Admitting: Radiation Oncology

## 2014-03-07 ENCOUNTER — Other Ambulatory Visit: Payer: Self-pay | Admitting: Internal Medicine

## 2014-03-07 VITALS — BP 106/64 | HR 78 | Temp 98.1°F | Resp 18

## 2014-03-07 VITALS — BP 96/63 | HR 80 | Temp 98.4°F | Resp 20 | Wt 207.0 lb

## 2014-03-07 DIAGNOSIS — Z510 Encounter for antineoplastic radiation therapy: Secondary | ICD-10-CM | POA: Diagnosis not present

## 2014-03-07 DIAGNOSIS — C155 Malignant neoplasm of lower third of esophagus: Secondary | ICD-10-CM

## 2014-03-07 DIAGNOSIS — Z5111 Encounter for antineoplastic chemotherapy: Secondary | ICD-10-CM

## 2014-03-07 DIAGNOSIS — C159 Malignant neoplasm of esophagus, unspecified: Secondary | ICD-10-CM

## 2014-03-07 LAB — CBC WITH DIFFERENTIAL/PLATELET
BASO%: 0.5 % (ref 0.0–2.0)
Basophils Absolute: 0 10*3/uL (ref 0.0–0.1)
EOS ABS: 0.2 10*3/uL (ref 0.0–0.5)
EOS%: 4.3 % (ref 0.0–7.0)
HCT: 28.3 % — ABNORMAL LOW (ref 38.4–49.9)
HGB: 8.8 g/dL — ABNORMAL LOW (ref 13.0–17.1)
LYMPH%: 7 % — ABNORMAL LOW (ref 14.0–49.0)
MCH: 24.5 pg — ABNORMAL LOW (ref 27.2–33.4)
MCHC: 31.1 g/dL — ABNORMAL LOW (ref 32.0–36.0)
MCV: 78.8 fL — ABNORMAL LOW (ref 79.3–98.0)
MONO#: 0.3 10*3/uL (ref 0.1–0.9)
MONO%: 6.6 % (ref 0.0–14.0)
NEUT%: 81.6 % — ABNORMAL HIGH (ref 39.0–75.0)
NEUTROS ABS: 3.6 10*3/uL (ref 1.5–6.5)
PLATELETS: 236 10*3/uL (ref 140–400)
RBC: 3.59 10*6/uL — AB (ref 4.20–5.82)
RDW: 15.3 % — AB (ref 11.0–14.6)
WBC: 4.4 10*3/uL (ref 4.0–10.3)
lymph#: 0.3 10*3/uL — ABNORMAL LOW (ref 0.9–3.3)

## 2014-03-07 LAB — COMPREHENSIVE METABOLIC PANEL (CC13)
ALBUMIN: 3 g/dL — AB (ref 3.5–5.0)
ALT: 7 U/L (ref 0–55)
ANION GAP: 7 meq/L (ref 3–11)
AST: 10 U/L (ref 5–34)
Alkaline Phosphatase: 62 U/L (ref 40–150)
BUN: 9.6 mg/dL (ref 7.0–26.0)
CHLORIDE: 104 meq/L (ref 98–109)
CO2: 26 meq/L (ref 22–29)
Calcium: 8.8 mg/dL (ref 8.4–10.4)
Creatinine: 0.8 mg/dL (ref 0.7–1.3)
GLUCOSE: 93 mg/dL (ref 70–140)
POTASSIUM: 4 meq/L (ref 3.5–5.1)
SODIUM: 137 meq/L (ref 136–145)
TOTAL PROTEIN: 6.4 g/dL (ref 6.4–8.3)
Total Bilirubin: 0.37 mg/dL (ref 0.20–1.20)

## 2014-03-07 MED ORDER — DIPHENHYDRAMINE HCL 50 MG/ML IJ SOLN
INTRAMUSCULAR | Status: AC
  Filled 2014-03-07: qty 1

## 2014-03-07 MED ORDER — ONDANSETRON 16 MG/50ML IVPB (CHCC)
INTRAVENOUS | Status: AC
  Filled 2014-03-07: qty 16

## 2014-03-07 MED ORDER — FAMOTIDINE IN NACL 20-0.9 MG/50ML-% IV SOLN
INTRAVENOUS | Status: AC
  Filled 2014-03-07: qty 50

## 2014-03-07 MED ORDER — FAMOTIDINE IN NACL 20-0.9 MG/50ML-% IV SOLN
20.0000 mg | Freq: Once | INTRAVENOUS | Status: AC
  Administered 2014-03-07: 20 mg via INTRAVENOUS

## 2014-03-07 MED ORDER — PACLITAXEL CHEMO INJECTION 300 MG/50ML
45.0000 mg/m2 | Freq: Once | INTRAVENOUS | Status: AC
  Administered 2014-03-07: 96 mg via INTRAVENOUS
  Filled 2014-03-07: qty 16

## 2014-03-07 MED ORDER — DIPHENHYDRAMINE HCL 50 MG/ML IJ SOLN
50.0000 mg | Freq: Once | INTRAMUSCULAR | Status: AC
  Administered 2014-03-07: 50 mg via INTRAVENOUS

## 2014-03-07 MED ORDER — DEXAMETHASONE SODIUM PHOSPHATE 20 MG/5ML IJ SOLN
INTRAMUSCULAR | Status: AC
  Filled 2014-03-07: qty 5

## 2014-03-07 MED ORDER — SODIUM CHLORIDE 0.9 % IV SOLN
223.0000 mg | Freq: Once | INTRAVENOUS | Status: AC
  Administered 2014-03-07: 220 mg via INTRAVENOUS
  Filled 2014-03-07: qty 22

## 2014-03-07 MED ORDER — ONDANSETRON 16 MG/50ML IVPB (CHCC)
16.0000 mg | Freq: Once | INTRAVENOUS | Status: AC
  Administered 2014-03-07: 16 mg via INTRAVENOUS

## 2014-03-07 MED ORDER — DEXAMETHASONE SODIUM PHOSPHATE 20 MG/5ML IJ SOLN
20.0000 mg | Freq: Once | INTRAMUSCULAR | Status: AC
  Administered 2014-03-07: 20 mg via INTRAVENOUS

## 2014-03-07 MED ORDER — SODIUM CHLORIDE 0.9 % IV SOLN
Freq: Once | INTRAVENOUS | Status: AC
  Administered 2014-03-07: 10:00:00 via INTRAVENOUS

## 2014-03-07 NOTE — Telephone Encounter (Signed)
lvm for pt regarding to 6.9.15 appt.Marland KitchenMarland Kitchen

## 2014-03-07 NOTE — Progress Notes (Signed)
   Weekly Management Note:  Outpatient Current Dose:  16.2 Gy  Projected Dose: 50.4 Gy   Narrative:  The patient presents for routine under treatment assessment.  CBCT/MVCT images/Port film x-rays were reviewed.  The chart was checked. No new complaints. No N/V, or new pain.  Swallowing better.   Physical Findings:  weight is 207 lb (93.895 kg). His oral temperature is 98.4 F (36.9 C). His blood pressure is 96/63 and his pulse is 80. His respiration is 20.  NAD, well appearing.  CBC    Component Value Date/Time   WBC 5.3 02/28/2014 0917   WBC 5.4 01/26/2014 1148   RBC 3.42* 02/28/2014 0917   RBC 3.84* 01/26/2014 1148   HGB 8.6* 02/28/2014 0917   HGB 10.4* 01/26/2014 1148   HCT 27.0* 02/28/2014 0917   HCT 31.6* 01/26/2014 1148   PLT 364 02/28/2014 0917   PLT 256.0 01/26/2014 1148   MCV 79.0* 02/28/2014 0917   MCV 82.3 01/26/2014 1148   MCH 25.2* 02/28/2014 0917   MCHC 31.9* 02/28/2014 0917   MCHC 33.0 01/26/2014 1148   RDW 15.5* 02/28/2014 0917   RDW 15.2* 01/26/2014 1148   LYMPHSABS 0.4* 02/28/2014 0917   LYMPHSABS 1.3 01/26/2014 1148   MONOABS 0.5 02/28/2014 0917   MONOABS 0.5 01/26/2014 1148   EOSABS 0.2 02/28/2014 0917   EOSABS 0.2 01/26/2014 1148   BASOSABS 0.0 02/28/2014 0917   BASOSABS 0.0 01/26/2014 1148     CMP     Component Value Date/Time   NA 139 02/28/2014 0917   NA 138 01/26/2014 1148   K 3.7 02/28/2014 0917   K 3.9 01/26/2014 1148   CL 104 01/26/2014 1148   CO2 23 02/28/2014 0917   CO2 29 01/26/2014 1148   GLUCOSE 94 02/28/2014 0917   GLUCOSE 105* 01/26/2014 1148   BUN 14.2 02/28/2014 0917   BUN 11 01/26/2014 1148   CREATININE 0.9 02/28/2014 0917   CREATININE 1.0 01/26/2014 1148   CALCIUM 9.0 02/28/2014 0917   CALCIUM 8.8 01/26/2014 1148   PROT 6.5 02/28/2014 0917   PROT 6.3 01/26/2014 1148   ALBUMIN 2.9* 02/28/2014 0917   ALBUMIN 3.3* 01/26/2014 1148   AST 12 02/28/2014 0917   AST 12 01/26/2014 1148   ALT 7 02/28/2014 0917   ALT 9 01/26/2014 1148   ALKPHOS 59 02/28/2014 0917   ALKPHOS 62 01/26/2014 1148   BILITOT 0.76 02/28/2014 0917   BILITOT 0.6 01/26/2014 1148     Impression:  The patient is tolerating radiotherapy.   Plan:  Continue radiotherapy as planned.   -----------------------------------  Eppie Gibson, MD

## 2014-03-07 NOTE — Progress Notes (Signed)
Pot denies pain, fatigue, loss of appetite. He states he is having less difficulty eating, swallowing. Pt has chemotherapy every Monday.

## 2014-03-07 NOTE — Telephone Encounter (Signed)
Per dr Vista Mink, pt needs f/u appt with AJ tomorrow.  Last f/u was 5/15.  Onc tx schedule filled out and given to [facility] for appt to be scheduled tomorrow.  SLJ

## 2014-03-07 NOTE — Patient Instructions (Signed)
[facility] Discharge Instructions for Patients Receiving Chemotherapy  Today you received the following chemotherapy agents: Taxol, Carboplatin  To help prevent nausea and vomiting after your treatment, we encourage you to take your nausea medication as prescribed.    If you develop nausea and vomiting that is not controlled by your nausea medication, call the clinic.   BELOW ARE SYMPTOMS THAT SHOULD BE REPORTED IMMEDIATELY:  *FEVER GREATER THAN 100.5 F  *CHILLS WITH OR WITHOUT FEVER  NAUSEA AND VOMITING THAT IS NOT CONTROLLED WITH YOUR NAUSEA MEDICATION  *UNUSUAL SHORTNESS OF BREATH  *UNUSUAL BRUISING OR BLEEDING  TENDERNESS IN MOUTH AND THROAT WITH OR WITHOUT PRESENCE OF ULCERS  *URINARY PROBLEMS  *BOWEL PROBLEMS  UNUSUAL RASH Items with * indicate a potential emergency and should be followed up as soon as possible.  Feel free to call the clinic you have any questions or concerns. The clinic phone number is [phone].    

## 2014-03-08 ENCOUNTER — Ambulatory Visit (HOSPITAL_BASED_OUTPATIENT_CLINIC_OR_DEPARTMENT_OTHER): Payer: Medicare Other | Admitting: Physician Assistant

## 2014-03-08 ENCOUNTER — Ambulatory Visit
Admission: RE | Admit: 2014-03-08 | Discharge: 2014-03-08 | Disposition: A | Discharge status: Home / Self Care | Payer: Medicare Other | Source: Ambulatory Visit | Attending: Radiation Oncology | Admitting: Radiation Oncology

## 2014-03-08 ENCOUNTER — Encounter: Payer: Self-pay | Admitting: Physician Assistant

## 2014-03-08 ENCOUNTER — Telehealth: Payer: Self-pay | Admitting: Internal Medicine

## 2014-03-08 VITALS — BP 107/52 | HR 87 | Temp 98.2°F | Resp 19 | Ht 66.0 in | Wt 205.6 lb

## 2014-03-08 DIAGNOSIS — Z510 Encounter for antineoplastic radiation therapy: Secondary | ICD-10-CM | POA: Diagnosis not present

## 2014-03-08 DIAGNOSIS — C159 Malignant neoplasm of esophagus, unspecified: Secondary | ICD-10-CM

## 2014-03-08 DIAGNOSIS — C155 Malignant neoplasm of lower third of esophagus: Secondary | ICD-10-CM

## 2014-03-08 NOTE — Progress Notes (Addendum)
No images are attached to the encounter. No scans are attached to the encounter. No scans are attached to the encounter. [place] VISIT PROGRESS NOTE  [name], MD [place] [address]  DIAGNOSIS: Esophageal carcinoma   Primary site: Esophagus - Adenocarcinoma   Staging method: AJCC 7th Edition   Clinical: Stage IIIB (T3, N2, M0) signed by Curt Bears, MD on 02/11/2014 10:45 AM   Summary: Stage IIIB (T3, N2, M0)  PRIOR THERAPY: None  CURRENT THERAPY: Concurrent chemoradiation with weekly chemotherapy in the form of carboplatin for an AUC of 2 and paclitaxel 45 mg per meter squared given weekly concurrent with radiation therapy. Currently the patient is scheduled to complete his last fraction of radiation on 04/01/2014. Status post 1 week of treatment  DISEASE STAGE: Esophageal carcinoma   Primary site: Esophagus - Adenocarcinoma   Staging method: AJCC 7th Edition   Clinical: Stage IIIB (T3, N2, M0) signed by Curt Bears, MD on 02/11/2014 10:45 AM   Summary: Stage IIIB (T3, N2, M0)  CHEMOTHERAPY INTENT: Control/palliative  CURRENT # OF CHEMOTHERAPY CYCLES: 2  CURRENT ANTIEMETICS: Compazine, dexamethasone  CURRENT SMOKING STATUS: Former smoker quit 09/30/1998  ORAL CHEMOTHERAPY AND CONSENT: n/a  CURRENT BISPHOSPHONATES USE: none  PAIN MANAGEMENT: none  NARCOTICS INDUCED CONSTIPATION: none  LIVING WILL AND CODE STATUS: ?   INTERVAL HISTORY: Aran Menning 68 y.o. male returns for a scheduled regular office visit for followup of his recently diagnosed esophageal carcinoma. Mr. Givler presents for a symptom management visit after completing his first week of concurrent chemoradiation. He states the day after he gets chemotherapy and radiation he is been worn out other than that he is tolerating his treatment relatively well. His appetite is fair. He notes his swallowing is improving. He denied fever, chills,  nausea, vomiting, diarrhea or constipation. Denies any significant weight loss or night sweats. No cough, chest pain or hemoptysis.   MEDICAL HISTORY: Past Medical History  Diagnosis Date  . GERD (gastroesophageal reflux disease)   . Hypertension   . Hyperlipemia   . 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 Barrett's esophagus 12/2013    ALLERGIES:  has No Known Allergies.  MEDICATIONS:  Current Outpatient Prescriptions  Medication Sig Dispense Refill  . cloNIDine (CATAPRES) 0.1 MG tablet Take 0.1 mg by mouth every morning.       . dutasteride (AVODART) 0.5 MG capsule Take 0.5 mg by mouth every evening.       . magnesium hydroxide (MILK OF MAGNESIA) 400 MG/5ML suspension Take 5 mLs by mouth daily as needed for mild constipation.      Marland Kitchen omeprazole (PRILOSEC) 40 MG capsule Take 40 mg by mouth daily.      . prochlorperazine (COMPAZINE) 10 MG tablet Take 1 tablet (10 mg total) by mouth every 6 (six) hours as needed for nausea or vomiting.  30 tablet  1  . simvastatin (ZOCOR) 40 MG tablet Take 40 mg by mouth daily.      . valsartan-hydrochlorothiazide (DIOVAN-HCT) 160-25 MG per tablet Take 1 tablet by mouth every morning.       . vitamin E (VITAMIN E) 1000 UNIT capsule Take 1,000 Units by mouth daily.      . Ascorbic Acid (VITAMIN C) 1000 MG tablet Take 1,000 mg by mouth daily.      . fexofenadine (ALLEGRA) 180 MG tablet Take 180 mg by mouth daily.      . Garlic 5188  MG CAPS Take 1 capsule by mouth daily.      Marland Kitchen omega-3 acid ethyl esters (LOVAZA) 1 G capsule Take 1 g by mouth daily.       No current facility-administered medications for this visit.    SURGICAL HISTORY:  Past Surgical History  Procedure Laterality Date  . Eus N/A 02/03/2014    Procedure: UPPER ENDOSCOPIC ULTRASOUND (EUS) LINEAR;  Surgeon: Milus Banister, MD;  Location: [facility] ENDOSCOPY;  Service: Endoscopy;  Laterality: N/A;    REVIEW OF SYSTEMS:  Constitutional: positive for fatigue Eyes: negative Ears, nose,  mouth, throat, and face: positive for Some difficulty swallowing, however this has improved Respiratory: negative Cardiovascular: negative Gastrointestinal: negative Genitourinary:negative Integument/breast: negative Hematologic/lymphatic: negative Musculoskeletal:negative Neurological: negative Behavioral/Psych: negative Endocrine: negative Allergic/Immunologic: negative   PHYSICAL EXAMINATION: General appearance: alert, cooperative, appears stated age and no distress Head: Normocephalic, without obvious abnormality, atraumatic Neck: no adenopathy, no carotid bruit, no JVD, supple, symmetrical, trachea midline and thyroid not enlarged, symmetric, no tenderness/mass/nodules Lymph nodes: Cervical, supraclavicular, and axillary nodes normal. Resp: clear to auscultation bilaterally Back: symmetric, no curvature. ROM normal. No CVA tenderness. Cardio: regular rate and rhythm, S1, S2 normal, no murmur, click, rub or gallop GI: soft, non-tender; bowel sounds normal; no masses,  no organomegaly Extremities: extremities normal, atraumatic, no cyanosis or edema Neurologic: Alert and oriented X 3, normal strength and tone. Normal symmetric reflexes. Normal coordination and gait  ECOG PERFORMANCE STATUS: 1 - Symptomatic but completely ambulatory  Blood pressure 107/52, pulse 87, temperature 98.2 F (36.8 C), temperature source Oral, resp. rate 19, height 5\' 6"  (1.676 m), weight 205 lb 9.6 oz (93.26 kg), SpO2 97.00%.  LABORATORY DATA: Lab Results  Component Value Date   WBC 4.4 03/07/2014   HGB 8.8* 03/07/2014   HCT 28.3* 03/07/2014   MCV 78.8* 03/07/2014   PLT 236 03/07/2014      Chemistry      Component Value Date/Time   NA 137 03/07/2014 0931   NA 138 01/26/2014 1148   K 4.0 03/07/2014 0931   K 3.9 01/26/2014 1148   CL 104 01/26/2014 1148   CO2 26 03/07/2014 0931   CO2 29 01/26/2014 1148   BUN 9.6 03/07/2014 0931   BUN 11 01/26/2014 1148   CREATININE 0.8 03/07/2014 0931   CREATININE 1.0 01/26/2014  1148      Component Value Date/Time   CALCIUM 8.8 03/07/2014 0931   CALCIUM 8.8 01/26/2014 1148   ALKPHOS 62 03/07/2014 0931   ALKPHOS 62 01/26/2014 1148   AST 10 03/07/2014 0931   AST 12 01/26/2014 1148   ALT 7 03/07/2014 0931   ALT 9 01/26/2014 1148   BILITOT 0.37 03/07/2014 0931   BILITOT 0.6 01/26/2014 1148       RADIOGRAPHIC STUDIES:  Nm Pet Image Initial (pi) Skull Base To Thigh  02/11/2014   CLINICAL DATA:  Initial treatment strategy for esophageal cancer.  EXAM: NUCLEAR MEDICINE PET SKULL BASE TO THIGH  TECHNIQUE: 10.3 mCi F-18 FDG was injected intravenously. Full-ring PET imaging was performed from the skull base to thigh after the radiotracer. CT data was obtained and used for attenuation correction and anatomic localization.  FASTING BLOOD GLUCOSE:  Value: 96 mg/dl  COMPARISON:  None.  FINDINGS: NECK  No hypermetabolic lymph nodes in the neck.  CHEST  Dilated esophagus is identified. Distal esophageal mass measures 3.1 x 4.4 x 6 cm. The SUV max associated with this mass is equal to 9.7. There is mild increased radiotracer uptake  associated with the sub- carinal lymph node. This has an SUV max equal to 3.7. There is no hypermetabolic pulmonary nodule or mass identified. No suspicious pulmonary nodules on the CT scan.  ABDOMEN/PELVIS  Mild increased FDG uptake is associated with the gastrohepatic ligament lymph node. The SUV max is equal to 2.9. Moderate hypermetabolic peripancreatic lymph node has an SUV max equal to 5.2. Moderate increased FDG uptake is associated with the portacaval lymph node. This has an SUV max equal to 5.2. Within the lateral segment of left hepatic lobe there is a focus of mild increased radiotracer uptake within SUV max equal to 3.8. No corresponding abnormality is noted on the CT images. Calcified atherosclerotic disease involves the abdominal aorta. No aneurysm.  SKELETON  Solitary focus of mild increased radiotracer uptake localizing to lateral aspect of the right fourth  rib has an SUV max equal to 2.8. No corresponding CT abnormality is identified.  IMPRESSION: 1. Large, intense focus of increased radiotracer uptake localizing to the distal esophagus compatible with primary esophageal neoplasm. 2. Malignant range FDG uptake is associated with the upper abdominal lymph nodes in the gastrohepatic ligament and portacaval region which is suspicious for metastatic adenopathy. There is mild, equivocal FDG uptake associated with the sub- carinal lymph node. 3. There is an equivocal focal of increased uptake localizing to the lateral segment of left hepatic lobe. A contrast enhanced MRI of the liver may provide a more sensitive assessment for early liver metastasis. 4. Solitary focus of increased uptake localizing to the lateral aspect of the right fourth rib is nonspecific. Although this this may be posttraumatic, bone metastasis is not excluded. No corresponding fracture is identified on the CT images.   Electronically Signed   By: Kerby Moors M.D.   On: 02/11/2014 10:07     ASSESSMENT/PLAN: Mr. Enis is a very pleasant 68 year old African American male recently diagnosed with stage IIIa (T1, N2, M0 distal esophageal adenocarcinoma. He is currently undergoing a course of concurrent chemoradiation with chemotherapy in the form of weekly carboplatin for an AUC of 2 paclitaxel at 45 mg per meter squared. Except for some fatigue and the day after chemotherapy thus far he is tolerating his treatment without difficulty. He  notes improvement in his ability to swallow. The patient was discussed with seen by [name]. He will continue with his course of concurrent chemoradiation and return in 2 weeks for another symptom management visit.    Carlton Adam, PA-C     All questions were answered. The patient knows to call the clinic with any problems, questions or concerns. We can certainly see the patient much sooner if necessary.  ADDENDUM: Hematology/Oncology Attending:   I had a face to face encounter with the patient. I recommended his care plan. This is a very pleasant 68 years old [place] American male recently diagnosed with a stage IIIB esophageal adenocarcinoma. He is currently undergoing a course of concurrent chemoradiation with weekly carboplatin and paclitaxel is status post 1 week off treatment. He is tolerating his treatment fairly well with no significant adverse effect and his swallowing is improving. I recommended for the patient to continue his current treatment as scheduled. He would come back for follow up visit in 2 weeks for reevaluation and management any adverse effect of his treatment. He was advised to call immediately if he has any concerning symptoms in the interval.  Disclaimer: This note was dictated with voice recognition software. Similar sounding words can inadvertently be transcribed and may  not be corrected upon review. Eilleen Kempf., MD 03/12/2014

## 2014-03-08 NOTE — Telephone Encounter (Signed)
Gave pt appt for lab, and chemo Md for June

## 2014-03-09 ENCOUNTER — Ambulatory Visit
Admission: RE | Admit: 2014-03-09 | Discharge: 2014-03-09 | Disposition: A | Discharge status: Home / Self Care | Payer: Medicare Other | Source: Ambulatory Visit | Attending: Radiation Oncology | Admitting: Radiation Oncology

## 2014-03-09 DIAGNOSIS — Z510 Encounter for antineoplastic radiation therapy: Secondary | ICD-10-CM | POA: Diagnosis not present

## 2014-03-10 ENCOUNTER — Ambulatory Visit
Admission: RE | Admit: 2014-03-10 | Discharge: 2014-03-10 | Disposition: A | Discharge status: Home / Self Care | Payer: Medicare Other | Source: Ambulatory Visit | Attending: Radiation Oncology | Admitting: Radiation Oncology

## 2014-03-10 DIAGNOSIS — Z510 Encounter for antineoplastic radiation therapy: Secondary | ICD-10-CM | POA: Diagnosis not present

## 2014-03-10 NOTE — Patient Instructions (Signed)
Continue labs, chemotherapy, and radiation as scheduled Followup in 2 weeks

## 2014-03-11 ENCOUNTER — Ambulatory Visit
Admission: RE | Admit: 2014-03-11 | Discharge: 2014-03-11 | Disposition: A | Discharge status: Home / Self Care | Payer: Medicare Other | Source: Ambulatory Visit | Attending: Radiation Oncology | Admitting: Radiation Oncology

## 2014-03-11 DIAGNOSIS — Z510 Encounter for antineoplastic radiation therapy: Secondary | ICD-10-CM | POA: Diagnosis not present

## 2014-03-14 ENCOUNTER — Ambulatory Visit: Payer: Medicare Other | Admitting: Nutrition

## 2014-03-14 ENCOUNTER — Ambulatory Visit
Admission: RE | Admit: 2014-03-14 | Discharge: 2014-03-14 | Disposition: A | Discharge status: Home / Self Care | Payer: Medicare Other | Source: Ambulatory Visit | Attending: Radiation Oncology | Admitting: Radiation Oncology

## 2014-03-14 ENCOUNTER — Encounter: Payer: Self-pay | Admitting: Radiation Oncology

## 2014-03-14 ENCOUNTER — Ambulatory Visit (HOSPITAL_BASED_OUTPATIENT_CLINIC_OR_DEPARTMENT_OTHER): Payer: Medicare Other

## 2014-03-14 VITALS — BP 98/64 | HR 90 | Temp 98.4°F | Resp 20 | Wt 205.0 lb

## 2014-03-14 DIAGNOSIS — C159 Malignant neoplasm of esophagus, unspecified: Secondary | ICD-10-CM

## 2014-03-14 DIAGNOSIS — Z5111 Encounter for antineoplastic chemotherapy: Secondary | ICD-10-CM

## 2014-03-14 DIAGNOSIS — Z510 Encounter for antineoplastic radiation therapy: Secondary | ICD-10-CM | POA: Diagnosis not present

## 2014-03-14 DIAGNOSIS — C155 Malignant neoplasm of lower third of esophagus: Secondary | ICD-10-CM

## 2014-03-14 DIAGNOSIS — I10 Essential (primary) hypertension: Secondary | ICD-10-CM

## 2014-03-14 LAB — COMPREHENSIVE METABOLIC PANEL (CC13)
ALBUMIN: 3.1 g/dL — AB (ref 3.5–5.0)
ALT: 7 U/L (ref 0–55)
AST: 10 U/L (ref 5–34)
Alkaline Phosphatase: 64 U/L (ref 40–150)
Anion Gap: 8 mEq/L (ref 3–11)
BUN: 17.1 mg/dL (ref 7.0–26.0)
CO2: 24 mEq/L (ref 22–29)
Calcium: 9 mg/dL (ref 8.4–10.4)
Chloride: 105 mEq/L (ref 98–109)
Creatinine: 0.9 mg/dL (ref 0.7–1.3)
GLUCOSE: 93 mg/dL (ref 70–140)
POTASSIUM: 4 meq/L (ref 3.5–5.1)
Sodium: 137 mEq/L (ref 136–145)
TOTAL PROTEIN: 6.5 g/dL (ref 6.4–8.3)
Total Bilirubin: 0.37 mg/dL (ref 0.20–1.20)

## 2014-03-14 LAB — CBC WITH DIFFERENTIAL/PLATELET
BASO%: 0.9 % (ref 0.0–2.0)
Basophils Absolute: 0 10*3/uL (ref 0.0–0.1)
EOS ABS: 0.1 10*3/uL (ref 0.0–0.5)
EOS%: 2.1 % (ref 0.0–7.0)
HCT: 30.1 % — ABNORMAL LOW (ref 38.4–49.9)
HGB: 9.4 g/dL — ABNORMAL LOW (ref 13.0–17.1)
LYMPH%: 3.5 % — AB (ref 14.0–49.0)
MCH: 25 pg — ABNORMAL LOW (ref 27.2–33.4)
MCHC: 31.3 g/dL — AB (ref 32.0–36.0)
MCV: 79.9 fL (ref 79.3–98.0)
MONO#: 0.4 10*3/uL (ref 0.1–0.9)
MONO%: 9.9 % (ref 0.0–14.0)
NEUT#: 3.7 10*3/uL (ref 1.5–6.5)
NEUT%: 83.6 % — ABNORMAL HIGH (ref 39.0–75.0)
Platelets: 298 10*3/uL (ref 140–400)
RBC: 3.77 10*6/uL — AB (ref 4.20–5.82)
RDW: 16.5 % — AB (ref 11.0–14.6)
WBC: 4.4 10*3/uL (ref 4.0–10.3)
lymph#: 0.2 10*3/uL — ABNORMAL LOW (ref 0.9–3.3)

## 2014-03-14 MED ORDER — DIPHENHYDRAMINE HCL 50 MG/ML IJ SOLN
INTRAMUSCULAR | Status: AC
  Filled 2014-03-14: qty 1

## 2014-03-14 MED ORDER — ONDANSETRON 16 MG/50ML IVPB (CHCC)
INTRAVENOUS | Status: AC
  Filled 2014-03-14: qty 16

## 2014-03-14 MED ORDER — DEXAMETHASONE SODIUM PHOSPHATE 20 MG/5ML IJ SOLN
20.0000 mg | Freq: Once | INTRAMUSCULAR | Status: AC
  Administered 2014-03-14: 20 mg via INTRAVENOUS

## 2014-03-14 MED ORDER — SODIUM CHLORIDE 0.9 % IV SOLN
223.0000 mg | Freq: Once | INTRAVENOUS | Status: AC
  Administered 2014-03-14: 220 mg via INTRAVENOUS
  Filled 2014-03-14: qty 22

## 2014-03-14 MED ORDER — SODIUM CHLORIDE 0.9 % IV SOLN
Freq: Once | INTRAVENOUS | Status: AC
  Administered 2014-03-14: 10:00:00 via INTRAVENOUS

## 2014-03-14 MED ORDER — DEXAMETHASONE SODIUM PHOSPHATE 20 MG/5ML IJ SOLN
INTRAMUSCULAR | Status: AC
  Filled 2014-03-14: qty 5

## 2014-03-14 MED ORDER — DIPHENHYDRAMINE HCL 50 MG/ML IJ SOLN
50.0000 mg | Freq: Once | INTRAMUSCULAR | Status: AC
  Administered 2014-03-14: 50 mg via INTRAVENOUS

## 2014-03-14 MED ORDER — PACLITAXEL CHEMO INJECTION 300 MG/50ML
45.0000 mg/m2 | Freq: Once | INTRAVENOUS | Status: AC
  Administered 2014-03-14: 96 mg via INTRAVENOUS
  Filled 2014-03-14: qty 16

## 2014-03-14 MED ORDER — FAMOTIDINE IN NACL 20-0.9 MG/50ML-% IV SOLN
INTRAVENOUS | Status: AC
  Filled 2014-03-14: qty 50

## 2014-03-14 MED ORDER — FAMOTIDINE IN NACL 20-0.9 MG/50ML-% IV SOLN
20.0000 mg | Freq: Once | INTRAVENOUS | Status: AC
  Administered 2014-03-14: 20 mg via INTRAVENOUS

## 2014-03-14 MED ORDER — ONDANSETRON 16 MG/50ML IVPB (CHCC)
16.0000 mg | Freq: Once | INTRAVENOUS | Status: AC
  Administered 2014-03-14: 16 mg via INTRAVENOUS

## 2014-03-14 NOTE — Progress Notes (Signed)
Weekly Management Note:  Site: Distal esophagus Current Dose:  2520  cGy Projected Dose: 5040  cGy  Narrative: The patient is seen today for routine under treatment assessment. CBCT/MVCT images/port films were reviewed. The chart was reviewed.   He states that his dysphagia is much improved. He is currently undergoing evaluation by Rheumatology to see if he has scleroderma. I've not yet heard back from them. He has a followup visit on July 6. His weight remains stable. His chemotherapy has been going well.  Physical Examination:  Filed Vitals:   03/14/14 0845  BP: 98/64  Pulse: 90  Temp: 98.4 F (36.9 C)  Resp: 20  .  Weight: 205 lb (92.987 kg). No change.  Lab Results  Component Value Date   WBC 4.4 03/07/2014   HGB 8.8* 03/07/2014   HCT 28.3* 03/07/2014   MCV 78.8* 03/07/2014   PLT 236 03/07/2014    Impression: Tolerating radiation therapy well. I will check on the findings from Rheumatology.  Plan: Continue radiation therapy as planned.

## 2014-03-14 NOTE — Progress Notes (Signed)
Nutrition followup completed with patient during chemotherapy.  Patient has been diagnosed with esophageal cancer.  Patient reports he feels well.  Weight has increased to 205 pounds June 15, from 201.6 pounds June 4.  Patient denies nutrition side effects.  He is tolerating Ensure Plus.  He has not tried [employer] at this time.  Nutrition diagnosis: Unintentional weight loss, improved.  Intervention: Patient educated to continue small, frequent meals and snacks with higher calories and protein.  Patient to continue oral nutrition supplements 2-3 times daily.  Provided patient with additional coupons.  Questions answered.  Teach back method used.  Monitoring, evaluation, goals: Patient has increased oral intake and has gained approximately 3 pounds.  Next visit: Monday, during chemotherapy.

## 2014-03-14 NOTE — Progress Notes (Signed)
Pt denies pain, nausea/vomiting, fatigue, loss of appetite. He states he is able to eat foods he was unable to swallow prior to treatment. Pt has seen nutritionist, will touch base with Barb to contact pt again per his request.

## 2014-03-14 NOTE — Patient Instructions (Signed)
[facility] Discharge Instructions for Patients Receiving Chemotherapy  Today you received the following chemotherapy agents: Taxol, Carboplatin  To help prevent nausea and vomiting after your treatment, we encourage you to take your nausea medication as prescribed.    If you develop nausea and vomiting that is not controlled by your nausea medication, call the clinic.   BELOW ARE SYMPTOMS THAT SHOULD BE REPORTED IMMEDIATELY:  *FEVER GREATER THAN 100.5 F  *CHILLS WITH OR WITHOUT FEVER  NAUSEA AND VOMITING THAT IS NOT CONTROLLED WITH YOUR NAUSEA MEDICATION  *UNUSUAL SHORTNESS OF BREATH  *UNUSUAL BRUISING OR BLEEDING  TENDERNESS IN MOUTH AND THROAT WITH OR WITHOUT PRESENCE OF ULCERS  *URINARY PROBLEMS  *BOWEL PROBLEMS  UNUSUAL RASH Items with * indicate a potential emergency and should be followed up as soon as possible.  Feel free to call the clinic you have any questions or concerns. The clinic phone number is [phone].    

## 2014-03-14 NOTE — Patient Instructions (Signed)
[facility] Discharge Instructions for Patients Receiving Chemotherapy  Today you received the following chemotherapy agents: Taxol, Carboplatin  To help prevent nausea and vomiting after your treatment, we encourage you to take your nausea medication as prescribed.    If you develop nausea and vomiting that is not controlled by your nausea medication, call the clinic.   BELOW ARE SYMPTOMS THAT SHOULD BE REPORTED IMMEDIATELY:  *FEVER GREATER THAN 100.5 F  *CHILLS WITH OR WITHOUT FEVER  NAUSEA AND VOMITING THAT IS NOT CONTROLLED WITH YOUR NAUSEA MEDICATION  *UNUSUAL SHORTNESS OF BREATH  *UNUSUAL BRUISING OR BLEEDING  TENDERNESS IN MOUTH AND THROAT WITH OR WITHOUT PRESENCE OF ULCERS  *URINARY PROBLEMS  *BOWEL PROBLEMS  UNUSUAL RASH Items with * indicate a potential emergency and should be followed up as soon as possible.  Feel free to call the clinic you have any questions or concerns. The clinic phone number is [phone].    

## 2014-03-15 ENCOUNTER — Ambulatory Visit
Admission: RE | Admit: 2014-03-15 | Discharge: 2014-03-15 | Disposition: A | Discharge status: Home / Self Care | Payer: Medicare Other | Source: Ambulatory Visit | Attending: Radiation Oncology | Admitting: Radiation Oncology

## 2014-03-15 DIAGNOSIS — Z510 Encounter for antineoplastic radiation therapy: Secondary | ICD-10-CM | POA: Diagnosis not present

## 2014-03-16 ENCOUNTER — Ambulatory Visit
Admission: RE | Admit: 2014-03-16 | Discharge: 2014-03-16 | Disposition: A | Discharge status: Home / Self Care | Payer: Medicare Other | Source: Ambulatory Visit | Attending: Radiation Oncology | Admitting: Radiation Oncology

## 2014-03-16 DIAGNOSIS — Z510 Encounter for antineoplastic radiation therapy: Secondary | ICD-10-CM | POA: Diagnosis not present

## 2014-03-17 ENCOUNTER — Ambulatory Visit
Admission: RE | Admit: 2014-03-17 | Discharge: 2014-03-17 | Disposition: A | Discharge status: Home / Self Care | Payer: Medicare Other | Source: Ambulatory Visit | Attending: Radiation Oncology | Admitting: Radiation Oncology

## 2014-03-17 DIAGNOSIS — Z510 Encounter for antineoplastic radiation therapy: Secondary | ICD-10-CM | POA: Diagnosis not present

## 2014-03-18 ENCOUNTER — Ambulatory Visit
Admission: RE | Admit: 2014-03-18 | Discharge: 2014-03-18 | Disposition: A | Discharge status: Home / Self Care | Payer: Medicare Other | Source: Ambulatory Visit | Attending: Radiation Oncology | Admitting: Radiation Oncology

## 2014-03-18 DIAGNOSIS — Z510 Encounter for antineoplastic radiation therapy: Secondary | ICD-10-CM | POA: Diagnosis not present

## 2014-03-21 ENCOUNTER — Other Ambulatory Visit (HOSPITAL_BASED_OUTPATIENT_CLINIC_OR_DEPARTMENT_OTHER): Payer: Medicare Other

## 2014-03-21 ENCOUNTER — Ambulatory Visit
Admission: RE | Admit: 2014-03-21 | Discharge: 2014-03-21 | Disposition: A | Discharge status: Home / Self Care | Payer: Medicare Other | Source: Ambulatory Visit | Attending: Radiation Oncology | Admitting: Radiation Oncology

## 2014-03-21 ENCOUNTER — Ambulatory Visit: Payer: Medicare Other | Admitting: Nutrition

## 2014-03-21 ENCOUNTER — Encounter: Payer: Self-pay | Admitting: Physician Assistant

## 2014-03-21 ENCOUNTER — Ambulatory Visit (HOSPITAL_BASED_OUTPATIENT_CLINIC_OR_DEPARTMENT_OTHER): Payer: Medicare Other

## 2014-03-21 ENCOUNTER — Telehealth: Payer: Self-pay | Admitting: Internal Medicine

## 2014-03-21 ENCOUNTER — Ambulatory Visit (HOSPITAL_BASED_OUTPATIENT_CLINIC_OR_DEPARTMENT_OTHER): Payer: Medicare Other | Admitting: Physician Assistant

## 2014-03-21 VITALS — BP 119/61 | HR 93 | Temp 98.1°F | Resp 18 | Ht 66.0 in | Wt 205.2 lb

## 2014-03-21 VITALS — BP 96/63 | HR 80 | Temp 98.3°F | Resp 20 | Wt 204.1 lb

## 2014-03-21 DIAGNOSIS — Z5111 Encounter for antineoplastic chemotherapy: Secondary | ICD-10-CM

## 2014-03-21 DIAGNOSIS — C159 Malignant neoplasm of esophagus, unspecified: Secondary | ICD-10-CM

## 2014-03-21 DIAGNOSIS — C155 Malignant neoplasm of lower third of esophagus: Secondary | ICD-10-CM

## 2014-03-21 DIAGNOSIS — Z510 Encounter for antineoplastic radiation therapy: Secondary | ICD-10-CM | POA: Diagnosis not present

## 2014-03-21 LAB — CBC WITH DIFFERENTIAL/PLATELET
BASO%: 0.7 % (ref 0.0–2.0)
Basophils Absolute: 0 10*3/uL (ref 0.0–0.1)
EOS%: 1.3 % (ref 0.0–7.0)
Eosinophils Absolute: 0 10*3/uL (ref 0.0–0.5)
HEMATOCRIT: 29.7 % — AB (ref 38.4–49.9)
HGB: 9.4 g/dL — ABNORMAL LOW (ref 13.0–17.1)
LYMPH%: 2.7 % — AB (ref 14.0–49.0)
MCH: 25.2 pg — ABNORMAL LOW (ref 27.2–33.4)
MCHC: 31.5 g/dL — ABNORMAL LOW (ref 32.0–36.0)
MCV: 80.1 fL (ref 79.3–98.0)
MONO#: 0.4 10*3/uL (ref 0.1–0.9)
MONO%: 12.6 % (ref 0.0–14.0)
NEUT%: 82.7 % — ABNORMAL HIGH (ref 39.0–75.0)
NEUTROS ABS: 2.8 10*3/uL (ref 1.5–6.5)
PLATELETS: 217 10*3/uL (ref 140–400)
RBC: 3.71 10*6/uL — ABNORMAL LOW (ref 4.20–5.82)
RDW: 17.2 % — ABNORMAL HIGH (ref 11.0–14.6)
WBC: 3.4 10*3/uL — ABNORMAL LOW (ref 4.0–10.3)
lymph#: 0.1 10*3/uL — ABNORMAL LOW (ref 0.9–3.3)

## 2014-03-21 LAB — COMPREHENSIVE METABOLIC PANEL (CC13)
ALBUMIN: 3.2 g/dL — AB (ref 3.5–5.0)
ALT: 8 U/L (ref 0–55)
AST: 11 U/L (ref 5–34)
Alkaline Phosphatase: 61 U/L (ref 40–150)
Anion Gap: 8 mEq/L (ref 3–11)
BILIRUBIN TOTAL: 0.65 mg/dL (ref 0.20–1.20)
BUN: 14.8 mg/dL (ref 7.0–26.0)
CO2: 26 meq/L (ref 22–29)
Calcium: 9.1 mg/dL (ref 8.4–10.4)
Chloride: 104 mEq/L (ref 98–109)
Creatinine: 0.9 mg/dL (ref 0.7–1.3)
Glucose: 138 mg/dl (ref 70–140)
Potassium: 3.6 mEq/L (ref 3.5–5.1)
Sodium: 138 mEq/L (ref 136–145)
Total Protein: 6.6 g/dL (ref 6.4–8.3)

## 2014-03-21 MED ORDER — DIPHENHYDRAMINE HCL 50 MG/ML IJ SOLN
INTRAMUSCULAR | Status: AC
  Filled 2014-03-21: qty 1

## 2014-03-21 MED ORDER — SODIUM CHLORIDE 0.9 % IV SOLN
Freq: Once | INTRAVENOUS | Status: AC
  Administered 2014-03-21: 16:00:00 via INTRAVENOUS

## 2014-03-21 MED ORDER — FAMOTIDINE IN NACL 20-0.9 MG/50ML-% IV SOLN
INTRAVENOUS | Status: AC
  Filled 2014-03-21: qty 50

## 2014-03-21 MED ORDER — DEXAMETHASONE SODIUM PHOSPHATE 20 MG/5ML IJ SOLN
20.0000 mg | Freq: Once | INTRAMUSCULAR | Status: AC
  Administered 2014-03-21: 20 mg via INTRAVENOUS

## 2014-03-21 MED ORDER — FAMOTIDINE IN NACL 20-0.9 MG/50ML-% IV SOLN
20.0000 mg | Freq: Once | INTRAVENOUS | Status: AC
  Administered 2014-03-21: 20 mg via INTRAVENOUS

## 2014-03-21 MED ORDER — ONDANSETRON 16 MG/50ML IVPB (CHCC)
16.0000 mg | Freq: Once | INTRAVENOUS | Status: AC
  Administered 2014-03-21: 16 mg via INTRAVENOUS

## 2014-03-21 MED ORDER — PACLITAXEL CHEMO INJECTION 300 MG/50ML
45.0000 mg/m2 | Freq: Once | INTRAVENOUS | Status: AC
  Administered 2014-03-21: 96 mg via INTRAVENOUS
  Filled 2014-03-21: qty 16

## 2014-03-21 MED ORDER — ONDANSETRON 16 MG/50ML IVPB (CHCC)
INTRAVENOUS | Status: AC
  Filled 2014-03-21: qty 16

## 2014-03-21 MED ORDER — SODIUM CHLORIDE 0.9 % IV SOLN
223.0000 mg | Freq: Once | INTRAVENOUS | Status: AC
  Administered 2014-03-21: 220 mg via INTRAVENOUS
  Filled 2014-03-21: qty 22

## 2014-03-21 MED ORDER — DIPHENHYDRAMINE HCL 50 MG/ML IJ SOLN
50.0000 mg | Freq: Once | INTRAMUSCULAR | Status: AC
  Administered 2014-03-21: 50 mg via INTRAVENOUS

## 2014-03-21 MED ORDER — DEXAMETHASONE SODIUM PHOSPHATE 20 MG/5ML IJ SOLN
INTRAMUSCULAR | Status: AC
  Filled 2014-03-21: qty 5

## 2014-03-21 NOTE — Patient Instructions (Addendum)
[facility] Discharge Instructions for Patients Receiving Chemotherapy  Today you received the following chemotherapy agents: taxol, carboplatin   To help prevent nausea and vomiting after your treatment, we encourage you to take your nausea medication as prescribed.    If you develop nausea and vomiting that is not controlled by your nausea medication, call the clinic.   BELOW ARE SYMPTOMS THAT SHOULD BE REPORTED IMMEDIATELY:  *FEVER GREATER THAN 100.5 F  *CHILLS WITH OR WITHOUT FEVER  NAUSEA AND VOMITING THAT IS NOT CONTROLLED WITH YOUR NAUSEA MEDICATION  *UNUSUAL SHORTNESS OF BREATH  *UNUSUAL BRUISING OR BLEEDING  TENDERNESS IN MOUTH AND THROAT WITH OR WITHOUT PRESENCE OF ULCERS  *URINARY PROBLEMS  *BOWEL PROBLEMS  UNUSUAL RASH Items with * indicate a potential emergency and should be followed up as soon as possible.  Feel free to call the clinic you have any questions or concerns. The clinic phone number is [phone].   Complete your course of concurrent chemoradiation as scheduled Follow with [name] and mid August 2015 with a restaging PET scan to reevaluate her disease

## 2014-03-21 NOTE — Patient Instructions (Signed)
[facility] Discharge Instructions for Patients Receiving Chemotherapy  Today you received the following chemotherapy agents: taxol, carboplatin   To help prevent nausea and vomiting after your treatment, we encourage you to take your nausea medication as prescribed.    If you develop nausea and vomiting that is not controlled by your nausea medication, call the clinic.   BELOW ARE SYMPTOMS THAT SHOULD BE REPORTED IMMEDIATELY:  *FEVER GREATER THAN 100.5 F  *CHILLS WITH OR WITHOUT FEVER  NAUSEA AND VOMITING THAT IS NOT CONTROLLED WITH YOUR NAUSEA MEDICATION  *UNUSUAL SHORTNESS OF BREATH  *UNUSUAL BRUISING OR BLEEDING  TENDERNESS IN MOUTH AND THROAT WITH OR WITHOUT PRESENCE OF ULCERS  *URINARY PROBLEMS  *BOWEL PROBLEMS  UNUSUAL RASH Items with * indicate a potential emergency and should be followed up as soon as possible.  Feel free to call the clinic you have any questions or concerns. The clinic phone number is [phone].    

## 2014-03-21 NOTE — Progress Notes (Signed)
Brief followup completed with patient.  Patient's weight has been relatively stable over the past 3 weeks.  Weight documented as 204.1 pounds June 22 from 201.6 pounds June 4.  Patient is drinking one to 2 Ensure Plus a day. He does have difficulty with early satiety.  States he needs to snack more.  Nutrition diagnosis: Unintentional weight loss improved.  Intervention: Patient to continue Ensure Plus one to 2 daily as needed for weight maintenance.  Patient will increase meals and snacks throughout the day.  Patient given coupons for Ensure Plus.  Teach back method used.  Monitoring, evaluation, goals: Patient is tolerating adequate calories and protein for weight maintenance.  Next visit: Monday, June 29, during chemotherapy.

## 2014-03-21 NOTE — Telephone Encounter (Signed)
Gave pt appt for lab and MD for June,july and August 2015

## 2014-03-21 NOTE — Progress Notes (Addendum)
No images are attached to the encounter. No scans are attached to the encounter. No scans are attached to the encounter. [place] VISIT PROGRESS NOTE  [name], MD [place] [address]  DIAGNOSIS: Esophageal carcinoma   Primary site: Esophagus - Adenocarcinoma   Staging method: AJCC 7th Edition   Clinical: Stage IIIB (T3, N2, M0) signed by Curt Bears, MD on 02/11/2014 10:45 AM   Summary: Stage IIIB (T3, N2, M0)  PRIOR THERAPY: None  CURRENT THERAPY: Concurrent chemoradiation with weekly chemotherapy in the form of carboplatin for an AUC of 2 and paclitaxel 45 mg per meter squared given weekly concurrent with radiation therapy. Currently the patient is scheduled to complete his last fraction of radiation on 04/01/2014. Status post 1 week of treatment  DISEASE STAGE: Esophageal carcinoma   Primary site: Esophagus - Adenocarcinoma   Staging method: AJCC 7th Edition   Clinical: Stage IIIB (T3, N2, M0) signed by Curt Bears, MD on 02/11/2014 10:45 AM   Summary: Stage IIIB (T3, N2, M0)  CHEMOTHERAPY INTENT: Control/palliative  CURRENT # OF CHEMOTHERAPY CYCLES: 3  CURRENT ANTIEMETICS: Compazine, dexamethasone  CURRENT SMOKING STATUS: Former smoker quit 09/30/1998  ORAL CHEMOTHERAPY AND CONSENT: n/a  CURRENT BISPHOSPHONATES USE: none  PAIN MANAGEMENT: none  NARCOTICS INDUCED CONSTIPATION: none  LIVING WILL AND CODE STATUS: ?   INTERVAL HISTORY: [name] [name] 68 y.o. male returns for a scheduled regular office visit for followup of his recently diagnosed esophageal carcinoma. He is tolerating his course of concurrent chemoradiation. He is scheduled to complete his radiation therapy on 04/04/2014. He voices no specific complaints today. He reports that he is swallowing better. His appetite is fair. He notes his swallowing is improving. He denied fever, chills, nausea, vomiting, diarrhea or constipation. Denies  any significant weight loss or night sweats. No cough, chest pain or hemoptysis.   MEDICAL HISTORY: Past Medical History  Diagnosis Date  . GERD (gastroesophageal reflux disease)   . Hypertension   . Hyperlipemia   . 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 Barrett's esophagus 12/2013    ALLERGIES:  has No Known Allergies.  MEDICATIONS:  Current Outpatient Prescriptions  Medication Sig Dispense Refill  . cloNIDine (CATAPRES) 0.1 MG tablet Take 0.1 mg by mouth every morning.       . dutasteride (AVODART) 0.5 MG capsule Take 0.5 mg by mouth every evening.       . Garlic 7564 MG CAPS Take 1 capsule by mouth daily.      Marland Kitchen omega-3 acid ethyl esters (LOVAZA) 1 G capsule Take 1 g by mouth daily.      Marland Kitchen omeprazole (PRILOSEC) 40 MG capsule Take 40 mg by mouth daily.      . prochlorperazine (COMPAZINE) 10 MG tablet Take 1 tablet (10 mg total) by mouth every 6 (six) hours as needed for nausea or vomiting.  30 tablet  1  . simvastatin (ZOCOR) 40 MG tablet Take 40 mg by mouth daily.      . valsartan-hydrochlorothiazide (DIOVAN-HCT) 160-25 MG per tablet Take 1 tablet by mouth every morning.       . vitamin E (VITAMIN E) 1000 UNIT capsule Take 1,000 Units by mouth daily.      . Ascorbic Acid (VITAMIN C) 1000 MG tablet Take 1,000 mg by mouth daily.      . fexofenadine (ALLEGRA) 180 MG tablet Take 180 mg by mouth daily.      . magnesium  hydroxide (MILK OF MAGNESIA) 400 MG/5ML suspension Take 5 mLs by mouth daily as needed for mild constipation.       No current facility-administered medications for this visit.    SURGICAL HISTORY:  Past Surgical History  Procedure Laterality Date  . Eus N/A 02/03/2014    Procedure: UPPER ENDOSCOPIC ULTRASOUND (EUS) LINEAR;  Surgeon: Milus Banister, MD;  Location: [facility] ENDOSCOPY;  Service: Endoscopy;  Laterality: N/A;    REVIEW OF SYSTEMS:  Constitutional: positive for fatigue Eyes: negative Ears, nose, mouth, throat, and face: positive for Some difficulty  swallowing, however this has improved Respiratory: negative Cardiovascular: negative Gastrointestinal: negative Genitourinary:negative Integument/breast: negative Hematologic/lymphatic: negative Musculoskeletal:negative Neurological: negative Behavioral/Psych: negative Endocrine: negative Allergic/Immunologic: negative   PHYSICAL EXAMINATION: General appearance: alert, cooperative, appears stated age and no distress Head: Normocephalic, without obvious abnormality, atraumatic Neck: no adenopathy, no carotid bruit, no JVD, supple, symmetrical, trachea midline and thyroid not enlarged, symmetric, no tenderness/mass/nodules Lymph nodes: Cervical, supraclavicular, and axillary nodes normal. Resp: clear to auscultation bilaterally Back: symmetric, no curvature. ROM normal. No CVA tenderness. Cardio: regular rate and rhythm, S1, S2 normal, no murmur, click, rub or gallop GI: soft, non-tender; bowel sounds normal; no masses,  no organomegaly Extremities: extremities normal, atraumatic, no cyanosis or edema Neurologic: Alert and oriented X 3, normal strength and tone. Normal symmetric reflexes. Normal coordination and gait  ECOG PERFORMANCE STATUS: 1 - Symptomatic but completely ambulatory  Blood pressure 119/61, pulse 93, temperature 98.1 F (36.7 C), temperature source Oral, resp. rate 18, height 5\' 6"  (1.676 m), weight 205 lb 3.2 oz (93.078 kg), SpO2 100.00%.  LABORATORY DATA: Lab Results  Component Value Date   WBC 3.4* 03/21/2014   HGB 9.4* 03/21/2014   HCT 29.7* 03/21/2014   MCV 80.1 03/21/2014   PLT 217 03/21/2014      Chemistry      Component Value Date/Time   NA 138 03/21/2014 1355   NA 138 01/26/2014 1148   K 3.6 03/21/2014 1355   K 3.9 01/26/2014 1148   CL 104 01/26/2014 1148   CO2 26 03/21/2014 1355   CO2 29 01/26/2014 1148   BUN 14.8 03/21/2014 1355   BUN 11 01/26/2014 1148   CREATININE 0.9 03/21/2014 1355   CREATININE 1.0 01/26/2014 1148      Component Value Date/Time    CALCIUM 9.1 03/21/2014 1355   CALCIUM 8.8 01/26/2014 1148   ALKPHOS 61 03/21/2014 1355   ALKPHOS 62 01/26/2014 1148   AST 11 03/21/2014 1355   AST 12 01/26/2014 1148   ALT 8 03/21/2014 1355   ALT 9 01/26/2014 1148   BILITOT 0.65 03/21/2014 1355   BILITOT 0.6 01/26/2014 1148       RADIOGRAPHIC STUDIES:  Nm Pet Image Initial (pi) Skull Base To Thigh  02/11/2014   CLINICAL DATA:  Initial treatment strategy for esophageal cancer.  EXAM: NUCLEAR MEDICINE PET SKULL BASE TO THIGH  TECHNIQUE: 10.3 mCi F-18 FDG was injected intravenously. Full-ring PET imaging was performed from the skull base to thigh after the radiotracer. CT data was obtained and used for attenuation correction and anatomic localization.  FASTING BLOOD GLUCOSE:  Value: 96 mg/dl  COMPARISON:  None.  FINDINGS: NECK  No hypermetabolic lymph nodes in the neck.  CHEST  Dilated esophagus is identified. Distal esophageal mass measures 3.1 x 4.4 x 6 cm. The SUV max associated with this mass is equal to 9.7. There is mild increased radiotracer uptake associated with the sub- carinal lymph node. This has an  SUV max equal to 3.7. There is no hypermetabolic pulmonary nodule or mass identified. No suspicious pulmonary nodules on the CT scan.  ABDOMEN/PELVIS  Mild increased FDG uptake is associated with the gastrohepatic ligament lymph node. The SUV max is equal to 2.9. Moderate hypermetabolic peripancreatic lymph node has an SUV max equal to 5.2. Moderate increased FDG uptake is associated with the portacaval lymph node. This has an SUV max equal to 5.2. Within the lateral segment of left hepatic lobe there is a focus of mild increased radiotracer uptake within SUV max equal to 3.8. No corresponding abnormality is noted on the CT images. Calcified atherosclerotic disease involves the abdominal aorta. No aneurysm.  SKELETON  Solitary focus of mild increased radiotracer uptake localizing to lateral aspect of the right fourth rib has an SUV max equal to 2.8. No  corresponding CT abnormality is identified.  IMPRESSION: 1. Large, intense focus of increased radiotracer uptake localizing to the distal esophagus compatible with primary esophageal neoplasm. 2. Malignant range FDG uptake is associated with the upper abdominal lymph nodes in the gastrohepatic ligament and portacaval region which is suspicious for metastatic adenopathy. There is mild, equivocal FDG uptake associated with the sub- carinal lymph node. 3. There is an equivocal focal of increased uptake localizing to the lateral segment of left hepatic lobe. A contrast enhanced MRI of the liver may provide a more sensitive assessment for early liver metastasis. 4. Solitary focus of increased uptake localizing to the lateral aspect of the right fourth rib is nonspecific. Although this this may be posttraumatic, bone metastasis is not excluded. No corresponding fracture is identified on the CT images.   Electronically Signed   By: Kerby Moors M.D.   On: 02/11/2014 10:07     ASSESSMENT/PLAN: [name] [name] is a very pleasant 68 year old African American male recently diagnosed with stage IIIa (T1, N2, M0 distal esophageal adenocarcinoma. He is currently undergoing a course of concurrent chemoradiation with chemotherapy in the form of weekly carboplatin for an AUC of 2 paclitaxel at 45 mg per meter squared. Except for some fatigue and the day after chemotherapy thus far he is tolerating his treatment without difficulty. He  notescontinued improvement in his ability to swallow. The patient was discussed with seen by [name]. He will complete his course of concurrent chemoradiation as scheduled. He will follow with [name] in mid August 2015 with a restaging PET scan to reevaluate his disease.    Wynetta Emery, ADRENA E, PA-C     All questions were answered. The patient knows to call the clinic with any problems, questions or concerns. We can certainly see the patient much sooner if  necessary.  ADDENDUM: Hematology/Oncology Attending:  I had a face to face encounter with the patient. I recommended his care plan. This is a very pleasant 68 years old [place] American male recently diagnosed with a stage IIIB esophageal adenocarcinoma. He is currently undergoing a course of concurrent chemoradiation with weekly carboplatin and paclitaxel is status post 1 week off treatment. He is tolerating his treatment fairly well with no significant adverse effect and his swallowing is improving. I recommended for the patient to continue his current treatment as scheduled. He would come back for follow up visit in 2 weeks for reevaluation and management any adverse effect of his treatment. He was advised to call immediately if he has any concerning symptoms in the interval.  Disclaimer: This note was dictated with voice recognition software. Similar sounding words can inadvertently be transcribed and may  not be corrected upon review.  Carlton Adam, PA-C 03/21/2014  ADDENDUM: Hematology/Oncology Attending:  I had a face to face encounter with the patient. I recommended his care plan. This is a very pleasant 69 years old [place] American male recently diagnosed with a stage IIIB esophageal adenocarcinoma. He is currently undergoing a course of concurrent chemoradiation with weekly carboplatin and paclitaxel status post 3 doses. He is tolerating his treatment fairly well with no significant adverse effects. He has some improvement in his swallowing. He denied having any significant weight loss or night sweats. He has no chest pain, shortness of breath, cough or hemoptysis. I recommended for the patient to proceed with his treatment as scheduled. He would come back for followup visit in 2 weeks for reevaluation. He was advised to call immediately if he has any concerning symptoms in the interval.  Disclaimer: This note was dictated with voice recognition software. Similar sounding words can  inadvertently be transcribed and may not be corrected upon review. Eilleen Kempf., MD 03/22/2014

## 2014-03-21 NOTE — Progress Notes (Addendum)
Patient denies pain, energy and appetite come and go. He denies difficulty eating, swallowing but states he "fills up fast". Pt drinking 1-2 cans Ensure daily, eating snacks occasionally. Advised he eat 5-6 small meals daily. BP low today. Pt takes Diovan-HCTZ. He will discuss taking BP med w/Dr Valere Dross.  Pt will check BP at home, informed him will be glad to check his BP any day this week.

## 2014-03-21 NOTE — Progress Notes (Signed)
Weekly Management Note:  Site: Distal esophagus Current Dose:  3420  cGy Projected Dose: 5040  CGy, no boost  Narrative: The patient is seen today for routine under treatment assessment. CBCT/MVCT images/port films were reviewed. The chart was reviewed.   He states that his dysphagia continues to improve. He's not having any pain on swallowing. His weight is down 1 pound over the past week. No difficulties with chemotherapy.  Physical Examination:  Filed Vitals:   03/21/14 0846  BP: 96/63  Pulse: 80  Temp: 98.3 F (36.8 C)  Resp: 20  .  Weight: 204 lb 1.6 oz (92.579 kg). No change.  Laboratory data: Lab Results  Component Value Date   WBC 4.4 03/14/2014   HGB 9.4* 03/14/2014   HCT 30.1* 03/14/2014   MCV 79.9 03/14/2014   PLT 298 03/14/2014     Impression: Tolerating radiation therapy well.  Plan: Continue radiation therapy as planned.

## 2014-03-22 ENCOUNTER — Ambulatory Visit
Admission: RE | Admit: 2014-03-22 | Discharge: 2014-03-22 | Disposition: A | Discharge status: Home / Self Care | Payer: Medicare Other | Source: Ambulatory Visit | Attending: Radiation Oncology | Admitting: Radiation Oncology

## 2014-03-22 ENCOUNTER — Ambulatory Visit: Payer: Medicare Other | Admitting: Gastroenterology

## 2014-03-22 DIAGNOSIS — Z510 Encounter for antineoplastic radiation therapy: Secondary | ICD-10-CM | POA: Diagnosis not present

## 2014-03-23 ENCOUNTER — Ambulatory Visit
Admission: RE | Admit: 2014-03-23 | Discharge: 2014-03-23 | Disposition: A | Discharge status: Home / Self Care | Payer: Medicare Other | Source: Ambulatory Visit | Attending: Radiation Oncology | Admitting: Radiation Oncology

## 2014-03-23 DIAGNOSIS — Z510 Encounter for antineoplastic radiation therapy: Secondary | ICD-10-CM | POA: Diagnosis not present

## 2014-03-24 ENCOUNTER — Ambulatory Visit: Payer: Medicare Other

## 2014-03-24 ENCOUNTER — Ambulatory Visit
Admission: RE | Admit: 2014-03-24 | Discharge: 2014-03-24 | Disposition: A | Discharge status: Home / Self Care | Payer: Medicare Other | Source: Ambulatory Visit | Attending: Radiation Oncology | Admitting: Radiation Oncology

## 2014-03-24 DIAGNOSIS — Z510 Encounter for antineoplastic radiation therapy: Secondary | ICD-10-CM | POA: Diagnosis not present

## 2014-03-25 ENCOUNTER — Ambulatory Visit
Admission: RE | Admit: 2014-03-25 | Discharge: 2014-03-25 | Disposition: A | Discharge status: Home / Self Care | Payer: Medicare Other | Source: Ambulatory Visit | Attending: Radiation Oncology | Admitting: Radiation Oncology

## 2014-03-25 ENCOUNTER — Ambulatory Visit: Payer: Medicare Other

## 2014-03-25 DIAGNOSIS — Z510 Encounter for antineoplastic radiation therapy: Secondary | ICD-10-CM | POA: Diagnosis not present

## 2014-03-28 ENCOUNTER — Ambulatory Visit: Payer: Medicare Other | Admitting: Nutrition

## 2014-03-28 ENCOUNTER — Ambulatory Visit (HOSPITAL_BASED_OUTPATIENT_CLINIC_OR_DEPARTMENT_OTHER): Payer: Medicare Other

## 2014-03-28 ENCOUNTER — Ambulatory Visit
Admission: RE | Admit: 2014-03-28 | Discharge: 2014-03-28 | Disposition: A | Discharge status: Home / Self Care | Payer: Medicare Other | Source: Ambulatory Visit | Attending: Radiation Oncology | Admitting: Radiation Oncology

## 2014-03-28 ENCOUNTER — Ambulatory Visit: Admission: RE | Admit: 2014-03-28 | Payer: Medicare Other | Source: Ambulatory Visit | Admitting: Radiation Oncology

## 2014-03-28 ENCOUNTER — Other Ambulatory Visit (HOSPITAL_BASED_OUTPATIENT_CLINIC_OR_DEPARTMENT_OTHER): Payer: Medicare Other

## 2014-03-28 ENCOUNTER — Ambulatory Visit: Payer: Medicare Other

## 2014-03-28 VITALS — BP 117/59 | HR 83 | Temp 98.2°F | Resp 18

## 2014-03-28 DIAGNOSIS — C159 Malignant neoplasm of esophagus, unspecified: Secondary | ICD-10-CM

## 2014-03-28 DIAGNOSIS — Z5111 Encounter for antineoplastic chemotherapy: Secondary | ICD-10-CM

## 2014-03-28 DIAGNOSIS — Z510 Encounter for antineoplastic radiation therapy: Secondary | ICD-10-CM | POA: Diagnosis not present

## 2014-03-28 LAB — CBC WITH DIFFERENTIAL/PLATELET
BASO%: 0.9 % (ref 0.0–2.0)
Basophils Absolute: 0 10*3/uL (ref 0.0–0.1)
EOS ABS: 0 10*3/uL (ref 0.0–0.5)
EOS%: 1.1 % (ref 0.0–7.0)
HCT: 29.1 % — ABNORMAL LOW (ref 38.4–49.9)
HGB: 9.2 g/dL — ABNORMAL LOW (ref 13.0–17.1)
LYMPH%: 1.8 % — ABNORMAL LOW (ref 14.0–49.0)
MCH: 25.6 pg — ABNORMAL LOW (ref 27.2–33.4)
MCHC: 31.7 g/dL — ABNORMAL LOW (ref 32.0–36.0)
MCV: 80.7 fL (ref 79.3–98.0)
MONO#: 0.4 10*3/uL (ref 0.1–0.9)
MONO%: 12.6 % (ref 0.0–14.0)
NEUT%: 83.6 % — ABNORMAL HIGH (ref 39.0–75.0)
NEUTROS ABS: 2.8 10*3/uL (ref 1.5–6.5)
Platelets: 130 10*3/uL — ABNORMAL LOW (ref 140–400)
RBC: 3.6 10*6/uL — AB (ref 4.20–5.82)
RDW: 18.1 % — AB (ref 11.0–14.6)
WBC: 3.3 10*3/uL — ABNORMAL LOW (ref 4.0–10.3)
lymph#: 0.1 10*3/uL — ABNORMAL LOW (ref 0.9–3.3)

## 2014-03-28 LAB — COMPREHENSIVE METABOLIC PANEL (CC13)
ALBUMIN: 2.9 g/dL — AB (ref 3.5–5.0)
ALK PHOS: 59 U/L (ref 40–150)
ALT: 10 U/L (ref 0–55)
AST: 13 U/L (ref 5–34)
Anion Gap: 8 mEq/L (ref 3–11)
BUN: 12 mg/dL (ref 7.0–26.0)
CO2: 26 mEq/L (ref 22–29)
Calcium: 8.9 mg/dL (ref 8.4–10.4)
Chloride: 102 mEq/L (ref 98–109)
Creatinine: 0.8 mg/dL (ref 0.7–1.3)
GLUCOSE: 97 mg/dL (ref 70–140)
POTASSIUM: 3.8 meq/L (ref 3.5–5.1)
SODIUM: 135 meq/L — AB (ref 136–145)
TOTAL PROTEIN: 6.2 g/dL — AB (ref 6.4–8.3)
Total Bilirubin: 0.35 mg/dL (ref 0.20–1.20)

## 2014-03-28 MED ORDER — PACLITAXEL CHEMO INJECTION 300 MG/50ML
45.0000 mg/m2 | Freq: Once | INTRAVENOUS | Status: AC
  Administered 2014-03-28: 96 mg via INTRAVENOUS
  Filled 2014-03-28: qty 16

## 2014-03-28 MED ORDER — HEPARIN SOD (PORK) LOCK FLUSH 100 UNIT/ML IV SOLN
500.0000 [IU] | Freq: Once | INTRAVENOUS | Status: AC
  Administered 2014-03-28: 500 [IU] via INTRAVENOUS
  Filled 2014-03-28: qty 5

## 2014-03-28 MED ORDER — DEXAMETHASONE SODIUM PHOSPHATE 20 MG/5ML IJ SOLN
20.0000 mg | Freq: Once | INTRAMUSCULAR | Status: AC
  Administered 2014-03-28: 20 mg via INTRAVENOUS

## 2014-03-28 MED ORDER — DIPHENHYDRAMINE HCL 50 MG/ML IJ SOLN
INTRAMUSCULAR | Status: AC
  Filled 2014-03-28: qty 1

## 2014-03-28 MED ORDER — SODIUM CHLORIDE 0.9 % IV SOLN
Freq: Once | INTRAVENOUS | Status: AC
  Administered 2014-03-28: 11:00:00 via INTRAVENOUS

## 2014-03-28 MED ORDER — ONDANSETRON 16 MG/50ML IVPB (CHCC)
16.0000 mg | Freq: Once | INTRAVENOUS | Status: AC
  Administered 2014-03-28: 16 mg via INTRAVENOUS

## 2014-03-28 MED ORDER — FAMOTIDINE IN NACL 20-0.9 MG/50ML-% IV SOLN
20.0000 mg | Freq: Once | INTRAVENOUS | Status: AC
  Administered 2014-03-28: 20 mg via INTRAVENOUS

## 2014-03-28 MED ORDER — SODIUM CHLORIDE 0.9 % IJ SOLN
10.0000 mL | INTRAMUSCULAR | Status: DC | PRN
  Administered 2014-03-28: 10 mL via INTRAVENOUS
  Filled 2014-03-28: qty 10

## 2014-03-28 MED ORDER — DIPHENHYDRAMINE HCL 50 MG/ML IJ SOLN
50.0000 mg | Freq: Once | INTRAMUSCULAR | Status: AC
Start: 2014-03-28 — End: 2014-03-28
  Administered 2014-03-28: 50 mg via INTRAVENOUS

## 2014-03-28 MED ORDER — DEXAMETHASONE SODIUM PHOSPHATE 20 MG/5ML IJ SOLN
INTRAMUSCULAR | Status: AC
  Filled 2014-03-28: qty 5

## 2014-03-28 MED ORDER — ONDANSETRON 16 MG/50ML IVPB (CHCC)
INTRAVENOUS | Status: AC
  Filled 2014-03-28: qty 16

## 2014-03-28 MED ORDER — FAMOTIDINE IN NACL 20-0.9 MG/50ML-% IV SOLN
INTRAVENOUS | Status: AC
  Filled 2014-03-28: qty 50

## 2014-03-28 MED ORDER — SODIUM CHLORIDE 0.9 % IV SOLN
223.0000 mg | Freq: Once | INTRAVENOUS | Status: AC
  Administered 2014-03-28: 220 mg via INTRAVENOUS
  Filled 2014-03-28: qty 22

## 2014-03-28 NOTE — Progress Notes (Signed)
Metabolic sent from lab by tube.  Creatinine 0.8

## 2014-03-28 NOTE — Patient Instructions (Signed)
[facility] Discharge Instructions for Patients Receiving Chemotherapy  Today you received the following chemotherapy agents: taxol, carboplatin  To help prevent nausea and vomiting after your treatment, we encourage you to take your nausea medication.  Take it as often as prescribed.     If you develop nausea and vomiting that is not controlled by your nausea medication, call the clinic. If it is after clinic hours your family physician or the after hours number for the clinic or go to the Emergency Department.   BELOW ARE SYMPTOMS THAT SHOULD BE REPORTED IMMEDIATELY:  *FEVER GREATER THAN 100.5 F  *CHILLS WITH OR WITHOUT FEVER  NAUSEA AND VOMITING THAT IS NOT CONTROLLED WITH YOUR NAUSEA MEDICATION  *UNUSUAL SHORTNESS OF BREATH  *UNUSUAL BRUISING OR BLEEDING  TENDERNESS IN MOUTH AND THROAT WITH OR WITHOUT PRESENCE OF ULCERS  *URINARY PROBLEMS  *BOWEL PROBLEMS  UNUSUAL RASH Items with * indicate a potential emergency and should be followed up as soon as possible.  Feel free to call the clinic you have any questions or concerns. The clinic phone number is [phone].   I have been informed and understand all the instructions given to me. I know to contact the clinic, my physician, or go to the Emergency Department if any problems should occur. I do not have any questions at this time, but understand that I may call the clinic during office hours   should I have any questions or need assistance in obtaining follow up care.    __________________________________________  _____________  __________ Signature of Patient or Authorized Representative            Date                   Time    __________________________________________ Nurse's Signature    

## 2014-03-28 NOTE — Progress Notes (Signed)
Nutrition followup completed with patient during chemotherapy.  Patient reports his appetite comes and goes.  He is trying to snack more often.  He is drinking one to 2 ensure or boost daily.  Weight is stable at 205.2 pounds.  Patient has no nutrition concerns.  Nutrition diagnosis: Unintentional weight loss resolved.  Patient educated to continue oral nutrition supplements once to twice a day daily for weight maintenance.  Patient is tolerating adequate calories and protein for weight maintenance.  No follow up scheduled. Patient has my contact information for nutrition side effect management or weight loss.

## 2014-03-29 ENCOUNTER — Ambulatory Visit: Payer: Medicare Other

## 2014-03-29 ENCOUNTER — Ambulatory Visit
Admission: RE | Admit: 2014-03-29 | Discharge: 2014-03-29 | Disposition: A | Discharge status: Home / Self Care | Payer: Medicare Other | Source: Ambulatory Visit | Attending: Radiation Oncology | Admitting: Radiation Oncology

## 2014-03-29 ENCOUNTER — Encounter: Payer: Self-pay | Admitting: Radiation Oncology

## 2014-03-29 VITALS — BP 131/61 | HR 93 | Temp 98.8°F | Resp 20 | Wt 207.8 lb

## 2014-03-29 DIAGNOSIS — C159 Malignant neoplasm of esophagus, unspecified: Secondary | ICD-10-CM

## 2014-03-29 DIAGNOSIS — Z510 Encounter for antineoplastic radiation therapy: Secondary | ICD-10-CM | POA: Diagnosis not present

## 2014-03-29 NOTE — Progress Notes (Signed)
Patient denies pain, does have some tenderness in chest area with eating but able to tolerate regular diet. Drinks Ensure 1-2 cans daily. Pt denies nausea, acid reflux, states if he feels discomfort when eating/drinking he drinks water and feels relief. Pt is slightly fatigued.

## 2014-03-29 NOTE — Telephone Encounter (Signed)
error 

## 2014-03-29 NOTE — Progress Notes (Signed)
Weekly Management Note Current Dose: 45 Gy  Projected Dose: 50.4Gy   Narrative:  The patient presents for routine under treatment assessment.  CBCT/MVCT images/Port film x-rays were reviewed.  The chart was checked. Doing well. No complaints. Asked about chemo/scheduling/etc. Has PET scan appt for August.  Physical Findings:  Weight stable. Appears well.   Vitals:  Filed Vitals:   03/29/14 0927  BP: 131/61  Pulse: 93  Temp: 98.8 F (37.1 C)  Resp: 20   Weight:  Wt Readings from Last 3 Encounters:  03/29/14 207 lb 12.8 oz (94.257 kg)  03/21/14 205 lb 3.2 oz (93.078 kg)  03/21/14 204 lb 1.6 oz (92.579 kg)   Lab Results  Component Value Date   WBC 3.3* 03/28/2014   HGB 9.2* 03/28/2014   HCT 29.1* 03/28/2014   MCV 80.7 03/28/2014   PLT 130* 03/28/2014   Lab Results  Component Value Date   CREATININE 0.8 03/28/2014   BUN 12.0 03/28/2014   NA 135* 03/28/2014   K 3.8 03/28/2014   CL 104 01/26/2014   CO2 26 03/28/2014     Impression:  The patient is tolerating radiation.  Plan:  Continue treatment as planned.

## 2014-03-30 ENCOUNTER — Ambulatory Visit
Admission: RE | Admit: 2014-03-30 | Discharge: 2014-03-30 | Disposition: A | Discharge status: Home / Self Care | Payer: Medicare Other | Source: Ambulatory Visit | Attending: Radiation Oncology | Admitting: Radiation Oncology

## 2014-03-30 ENCOUNTER — Ambulatory Visit: Payer: Medicare Other

## 2014-03-30 ENCOUNTER — Ambulatory Visit: Payer: Medicare Other | Admitting: Radiation Oncology

## 2014-03-30 DIAGNOSIS — Z510 Encounter for antineoplastic radiation therapy: Secondary | ICD-10-CM | POA: Diagnosis not present

## 2014-03-31 ENCOUNTER — Ambulatory Visit
Admission: RE | Admit: 2014-03-31 | Discharge: 2014-03-31 | Disposition: A | Discharge status: Home / Self Care | Payer: Medicare Other | Source: Ambulatory Visit | Attending: Radiation Oncology | Admitting: Radiation Oncology

## 2014-03-31 ENCOUNTER — Ambulatory Visit: Payer: Medicare Other

## 2014-03-31 DIAGNOSIS — Z510 Encounter for antineoplastic radiation therapy: Secondary | ICD-10-CM | POA: Diagnosis not present

## 2014-04-01 ENCOUNTER — Ambulatory Visit: Payer: Medicare Other

## 2014-04-04 ENCOUNTER — Other Ambulatory Visit (HOSPITAL_BASED_OUTPATIENT_CLINIC_OR_DEPARTMENT_OTHER): Payer: Medicare Other

## 2014-04-04 ENCOUNTER — Ambulatory Visit (INDEPENDENT_AMBULATORY_CARE_PROVIDER_SITE_OTHER): Payer: Medicare Other | Admitting: Internal Medicine

## 2014-04-04 ENCOUNTER — Ambulatory Visit: Payer: Medicare Other

## 2014-04-04 ENCOUNTER — Ambulatory Visit
Admission: RE | Admit: 2014-04-04 | Discharge: 2014-04-04 | Disposition: A | Discharge status: Home / Self Care | Payer: Medicare Other | Source: Ambulatory Visit | Attending: Radiation Oncology | Admitting: Radiation Oncology

## 2014-04-04 ENCOUNTER — Encounter: Payer: Self-pay | Admitting: Internal Medicine

## 2014-04-04 ENCOUNTER — Encounter: Payer: Self-pay | Admitting: Radiation Oncology

## 2014-04-04 VITALS — BP 94/60 | HR 102 | Ht 67.0 in | Wt 199.0 lb

## 2014-04-04 VITALS — BP 94/59 | HR 91 | Temp 98.3°F | Resp 12 | Wt 197.5 lb

## 2014-04-04 DIAGNOSIS — C155 Malignant neoplasm of lower third of esophagus: Secondary | ICD-10-CM

## 2014-04-04 DIAGNOSIS — J8410 Pulmonary fibrosis, unspecified: Secondary | ICD-10-CM

## 2014-04-04 DIAGNOSIS — J849 Interstitial pulmonary disease, unspecified: Secondary | ICD-10-CM

## 2014-04-04 DIAGNOSIS — Z510 Encounter for antineoplastic radiation therapy: Secondary | ICD-10-CM | POA: Diagnosis not present

## 2014-04-04 DIAGNOSIS — C159 Malignant neoplasm of esophagus, unspecified: Secondary | ICD-10-CM

## 2014-04-04 LAB — CBC WITH DIFFERENTIAL/PLATELET
BASO%: 1.1 % (ref 0.0–2.0)
BASOS ABS: 0 10*3/uL (ref 0.0–0.1)
EOS ABS: 0 10*3/uL (ref 0.0–0.5)
EOS%: 1.6 % (ref 0.0–7.0)
HEMATOCRIT: 30.4 % — AB (ref 38.4–49.9)
HEMOGLOBIN: 9.6 g/dL — AB (ref 13.0–17.1)
LYMPH#: 0.1 10*3/uL — AB (ref 0.9–3.3)
LYMPH%: 5.2 % — AB (ref 14.0–49.0)
MCH: 25.7 pg — ABNORMAL LOW (ref 27.2–33.4)
MCHC: 31.7 g/dL — ABNORMAL LOW (ref 32.0–36.0)
MCV: 81 fL (ref 79.3–98.0)
MONO#: 0.2 10*3/uL (ref 0.1–0.9)
MONO%: 13.7 % (ref 0.0–14.0)
NEUT%: 78.4 % — AB (ref 39.0–75.0)
NEUTROS ABS: 1.4 10*3/uL — AB (ref 1.5–6.5)
PLATELETS: 166 10*3/uL (ref 140–400)
RBC: 3.75 10*6/uL — ABNORMAL LOW (ref 4.20–5.82)
RDW: 20.4 % — ABNORMAL HIGH (ref 11.0–14.6)
WBC: 1.8 10*3/uL — AB (ref 4.0–10.3)

## 2014-04-04 LAB — COMPREHENSIVE METABOLIC PANEL (CC13)
ALBUMIN: 3.2 g/dL — AB (ref 3.5–5.0)
ALT: 14 U/L (ref 0–55)
AST: 16 U/L (ref 5–34)
Alkaline Phosphatase: 62 U/L (ref 40–150)
Anion Gap: 9 mEq/L (ref 3–11)
BUN: 14.8 mg/dL (ref 7.0–26.0)
CALCIUM: 9.2 mg/dL (ref 8.4–10.4)
CHLORIDE: 98 meq/L (ref 98–109)
CO2: 27 mEq/L (ref 22–29)
Creatinine: 1 mg/dL (ref 0.7–1.3)
Glucose: 101 mg/dl (ref 70–140)
POTASSIUM: 3.3 meq/L — AB (ref 3.5–5.1)
Sodium: 134 mEq/L — ABNORMAL LOW (ref 136–145)
Total Bilirubin: 0.76 mg/dL (ref 0.20–1.20)
Total Protein: 6.8 g/dL (ref 6.4–8.3)

## 2014-04-04 NOTE — Progress Notes (Signed)
Pt complains of, Poor Appetite. Pt has had dysphagia for solids. Pt has lost approx 10 lbs since last appointment, he states he has a poor appetite and occasionally has indigestion. Oral exam reveals a mod to large amt of white exudate on tongue. Skin hyperpigmentation -  Torso/sternum.

## 2014-04-04 NOTE — Progress Notes (Signed)
Weekly Management Note:  Site: Esophagus  Current Dose:  5040  cGy Projected Dose: 5040  cGy  Narrative: The patient is seen today for routine under treatment assessment. CBCT/MVCT images/port films were reviewed. The chart was reviewed.   He is having less dysphagia. His appetite is poor and as a result, his by mouth intake has been decreased. His weight is down 10 pounds over the past week. He finishes his radiation therapy today. He finishes chemotherapy earlier last week.  Physical Examination:  Filed Vitals:   04/04/14 0912  BP: 94/59  Pulse: 91  Temp: 98.3 F (36.8 C)  Resp: 12  .  Weight: 197 lb 8 oz (89.585 kg). There is slight hyperpigmentation the skin along the chest with no areas of desquamation.  Laboratory data: Lab Results  Component Value Date   WBC 1.8* 04/04/2014   HGB 9.6* 04/04/2014   HCT 30.4* 04/04/2014   MCV 81.0 04/04/2014   PLT 166 04/04/2014     Impression: Radiation therapy completed. I encouraged him to increase his by mouth supplements.  Plan: Followup visit with me in one month.

## 2014-04-04 NOTE — Progress Notes (Signed)
Subjective:    Patient ID: [name] [name], male    DOB: 03/20/1946, 68 y.o.   MRN: [id number]  HPI  PCP [name], MD Onc: Dr Julien Nordmann and Dr Valere Dross GI: Dr Deatra Ina Rheum: DR Gavin Pound   IOV 04/04/2014  Chief Complaint  Patient presents with  . Pulmonary Consult    Referred by Dr. Trudie Reed for ILD    [name] [name] is a 68 y.o. male with a past medical history significant for hypertension, dyslipidemia, GERD as well as benign prostatic hypertrophy. The patient has been complaining of swallowing difficulty for 3-4 weeks prior to dx with increased mucus production and difficulty swallowing solid food. He has a history of acid reflux and has been on treatment with Nexium but he was not taking it on daily basis. He also lost around 50 pounds in the last few weeks prior to diagnosis. He was seen by his primary care physician and referred to Dr. [name] for evaluation. On 01/26/2014 he underwent upper endoscopy under the care of Dr. Deatra Ina and it showed an exophytic friable mass at approximately 35 CM from the incisors. The mass extended to the GE junction where there was a moderate stricture. Multiple biopsies were taken. The findings were concerning for esophageal neoplasm as well as Barrett's esophagus.  CT scan of the chest, abdomen and pelvis were performed on 01/28/2014. It showed marked irregular thickening of the distal esophagus at and immediately above the gastroesophageal junction, corresponds to known esophageal mass. Proximal to this, the esophagus is markedly dilated, with a a large air-fluid level. Prominent low right paratracheal lymph node measures 8 mm in short axis. Subcarinal  lymph node measuring 9 mm in short axis. No definite mediastinal or hilar lymph nodes meet criteria for pathologic enlargement at this  time. There was also enlarged lymph nodes in the gastrohepatic ligament measuring 1.6 CM in short axis concerning for nodal metastasis.   He has a history of  smoking 2 packs per day for around 35 years but quit 15 years ago. He also has a history of alcohol abuse but not recently and no history of drug abuse.   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   Concurrent chemoradiation with weekly chemotherapy in the form of carboplatin for an AUC of 2 and paclitaxel 45 mg per meter squared given weekly concurrent with radiation therapy. Currently the patient is scheduled to complete his last fraction of radiation on 04/01/2014.   DISEASE STAGE:  Esophageal carcinoma  Primary site: Esophagus - Adenocarcinoma  Staging method: AJCC 7th Edition  Clinical: Stage IIIB (T3, N2, M0) Summary: Stage IIIB (T3, N2, M0)  CHEMOTHERAPY INTENT: Control/palliative  CURRENT # OF CHEMOTHERAPY CYCLES: 3 as of 03/21/14 CURRENT ANTIEMETICS: Compazine, dexamethasone  CURRENT SMOKING STATUS: Former smoker quit 09/30/1998    He denies any shortness of breath or cough but he does have clubbing. The CT scan of the chest on 01/28/2014 suggested interstitial lung disease not consistent with IPF. There was concern by the radiologist for scleroderma so he was referred to Dr. Gavin Pound was on extensive autoimmune workup on 03/03/2014 and this was negative. Therefore he is being referred to me pulmonary. Continue to emphasize the patient is asymptomatic other than physical deconditioning and dysphagia from chemo/cancer  FINDINGS:  CT CHEST FINDINGS 01/28/14 - PRECHEMO  Mediastinum: Marked irregular thickening of the distal esophagus at  and  immediately above the gastroesophageal junction, corresponds to  known esophageal mass. Proximal to this, the esophagus is markedly  dilated, with a a large air-fluid level. Prominent low right  paratracheal lymph node measures 8 mm in short axis. Subcarinal  lymph node measuring 9 mm in short axis. No definite mediastinal or   hilar lymph nodes meet criteria for pathologic enlargement at this  time. Heart size is normal. There is no significant pericardial  fluid, thickening or pericardial calcification.  Lungs/Pleura: Throughout the lung bases bilaterally there is  widespread ground-glass attenuation with associated septal  thickening, thickening of the peribronchovascular interstitium and  mild cylindrical bronchiectasis. 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 No acute consolidative  airspace disease. No pleural effusions. Multiple tiny calcified  granulomas throughout the lung bases bilaterally.  Musculoskeletal: There are no aggressive appearing lytic or blastic  lesions noted in the visualized portions of the skeleton.  CT ABDOMEN AND PELVIS FINDINGS  Abdomen/Pelvis: Enlarged lymph node in the gastrohepatic ligament  measuring 16 mm in short axis (image 55 of series 2), concerning for  nodal metastasis. The thickening of the gastroesophageal junction  does not involve the more distal portions of the stomach. The  appearance of the liver, gallbladder, pancreas, spleen and bilateral  adrenal glands is unremarkable. Sub cm low-attenuation lesions in  the kidneys bilaterally are too small to definitively characterize,  but are favored to represent tiny cysts. Normal appendix. No  significant volume of ascites. No pneumoperitoneum. No pathologic  distention of small bowel. Prostate gland and urinary bladder are  unremarkable in appearance.  Musculoskeletal: There are no aggressive appearing lytic or blastic  lesions noted in the visualized portions of the skeleton.  IMPRESSION:  1. Marked irregular masslike thickening at an immediately above the  gastroesophageal junction, compatible with the reported esophageal  neoplasm. 16 mm short axis gastrohepatic ligament lymph node is  concerning for a local nodal metastasis. Note definite signs of  distal metastatic disease  in the chest, abdomen or pelvis on today's  examination.  2. 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 Rheumatologic consultation is suggested.  3. Additional incidental findings, as above.  Electronically Signed  By: Vinnie Langton M.D.  On: 01/28/2014 14:18    Autoimmune labs with Dr. Gavin Pound 02/28/14  Hepatitis virus panel is negative. C-reactive protein is 16 and elevated. HLA-B27 is negative ACE level is negative/normal at 23 Ankle but this, anticentromere B. antibody, anti-Jo 1, Smith antibody, anti-scleroderma 70 antibody, Sjgren antibodies, nose and the DNA, rheumatoid factor, creatinine kinase, uric acid total normal, CCP antibody is normal.  Sedimentation rate is elevated at 54. ANA is positive for trace. RNP antibody is elevated 1.6 mildly positive   Walk test 185 feet x 3 laps on RA: DID NOT DESAT but stopped at 2 laps due to fatigue  Review of Systems  Constitutional: Negative for fever and unexpected weight change.  HENT: Positive for rhinorrhea and trouble swallowing. Negative for congestion, dental problem, ear pain, nosebleeds, postnasal drip, sinus pressure, sneezing and sore throat.   Eyes: Negative for redness and itching.  Respiratory: Negative for cough, chest tightness, shortness of breath and  wheezing.   Cardiovascular: Positive for leg swelling. Negative for palpitations.  Gastrointestinal: Negative for nausea and vomiting.  Genitourinary: Negative for dysuria.  Musculoskeletal: Negative for joint swelling.  Skin: Negative for rash.  Neurological: Negative for headaches.    Hematological: Does not bruise/bleed easily.  Psychiatric/Behavioral: Negative for dysphoric mood. The patient is not nervous/anxious.        Objective:   Physical Exam  Nursing note and vitals reviewed. Constitutional: He is oriented to person, place, and time. He appears well-developed and well-nourished. No distress.  HENT:  Head: Normocephalic and atraumatic.  Right Ear: External ear normal.  Left Ear: External ear normal.  Mouth/Throat: Oropharynx is clear and moist. No oropharyngeal exudate.  Eyes: Conjunctivae and EOM are normal. Pupils are equal, round, and reactive to light. Right eye exhibits no discharge. Left eye exhibits no discharge. No scleral icterus.  Neck: Normal range of motion. Neck supple. No JVD present. No tracheal deviation present. No thyromegaly present.  Cardiovascular: Normal rate, regular rhythm and intact distal pulses.  Exam reveals no gallop and no friction rub.   No murmur heard. Pulmonary/Chest: Effort normal. No respiratory distress. He has no wheezes. He has rales. He exhibits no tenderness.  Mild basal rales  Abdominal: Soft. Bowel sounds are normal. He exhibits no distension and no mass. There is no tenderness. There is no rebound and no guarding.  Musculoskeletal: Normal range of motion. He exhibits no edema and no tenderness.  Clubbing +  Lymphadenopathy:    He has no cervical adenopathy.  Neurological: He is alert and oriented to person, place, and time. He has normal reflexes. No cranial nerve deficit. Coordination normal.  Skin: Skin is warm and dry. No rash noted. He is not diaphoretic. No erythema. No pallor.  Psychiatric: He has a normal mood and affect. His behavior is normal. Judgment and thought content normal.    Filed Vitals:   04/04/14 1407  BP: 94/60  Pulse: 102  Height: 5' 7"  (1.702 m)  Weight: 199 lb (90.266 kg)  SpO2: 98%   \     Assessment & Plan:  #ILD  - you have scarring disease in lungs called ILD - not due to  autoimmune disease - CT not fully diagnostic of IPF - could be dysphagia -> aspiration -> ILD  - - do full PFT  - do walk test on room air - dp ONO test - NExt few weeks I will contact Dr Julien Nordmann to figure out his  thinking on your cancer   #Followup  - 4-6 weeks after full PFT  - At followup will discuss possible need for lung biopsy

## 2014-04-04 NOTE — Progress Notes (Addendum)
[facility] End of Treatment Note  Name:[name] [name]  Date: 04/04/2014 PIR:[id number] DOB:1946/07/02   Status:outpatient    CC: [name], MD  Dr. Curt Bears  REFERRING PHYSICIAN:   Dr. Curt Bears   DIAGNOSIS: Clinical stage III (T3, N2, M0) adenocarcinoma of the distal esophagus   INDICATION FOR TREATMENT: "Curative"   TREATMENT DATES: 02/23/2014 through 04/04/2014                          SITE/DOSE:   Esophagus 5040 cGy in 28 sessions                         BEAMS/ENERGY:  4 field technique with 15 MV photons                 NARRATIVE:  [name] tolerated treatment well with improvement of his dysphagia midway through his course of therapy. However, his appetite remains poor and he lost approximately 10 pounds during his last week of therapy. He did not have any significant odynophagia. He was encouraged to increase his by mouth intake with nutritional supplements. His chemotherapy was well tolerated under the direction [name].                          PLAN: Routine followup in one month. Patient instructed to call if questions or worsening complaints in interim.

## 2014-04-04 NOTE — Patient Instructions (Addendum)
#  ILD  - you have scarring disease in lungs called ILD - not due to autoimmune disease - CT not fully diagnostic of IPF - could be dysphagia -> aspiration -> ILD  - - do full PFT  - do walk test on room air - dp ONO test - NExt few weeks I will contact Dr Julien Nordmann to figure out his  thinking on your cancer   #Followup  - 4-6 weeks after full PFT  - At followup will discuss possible need for lung biopsy

## 2014-04-08 ENCOUNTER — Telehealth: Payer: Self-pay | Admitting: *Deleted

## 2014-04-08 NOTE — Telephone Encounter (Signed)
Office note from Dr Trudie Reed at [facility] dated 04/07/14 given to Dr Vista Mink to review.  SLJ

## 2014-04-09 ENCOUNTER — Telehealth: Payer: Self-pay | Admitting: Internal Medicine

## 2014-04-09 NOTE — Assessment & Plan Note (Signed)
#  ILD  - you have scarring disease in lungs called ILD - not due to autoimmune disease - CT not fully diagnostic of IPF - could be dysphagia -> aspiration -> ILD  - - do full PFT  - do walk test on room air - dp ONO test - NExt few weeks I will contact Dr Julien Nordmann to figure out his  thinking on your cancer   #Followup  - 4-6 weeks after full PFT  - At followup will discuss possible need for lung biopsy

## 2014-04-09 NOTE — Telephone Encounter (Signed)
[name]  This patient [name] [name] you are treateing for cancer: can you give some light on his prognosis and future rx. HE  Might need surgical lung bx  for his ILD   THanks  Dr. Brand Males, M.D., [facility].C.P Pulmonary and Critical Care Medicine Staff Physician [facility] Pager: [phone], If no answer or between  15:00h - 7:00h: call [phone]  04/09/2014 11:00 PM

## 2014-04-14 ENCOUNTER — Ambulatory Visit (HOSPITAL_COMMUNITY)
Admission: RE | Admit: 2014-04-14 | Discharge: 2014-04-14 | Disposition: A | Discharge status: Home / Self Care | Payer: Medicare Other | Source: Ambulatory Visit | Attending: Internal Medicine | Admitting: Internal Medicine

## 2014-04-14 DIAGNOSIS — J849 Interstitial pulmonary disease, unspecified: Secondary | ICD-10-CM

## 2014-04-14 DIAGNOSIS — J8410 Pulmonary fibrosis, unspecified: Secondary | ICD-10-CM | POA: Insufficient documentation

## 2014-04-14 MED ORDER — ALBUTEROL SULFATE (2.5 MG/3ML) 0.083% IN NEBU
2.5000 mg | INHALATION_SOLUTION | Freq: Once | RESPIRATORY_TRACT | Status: AC
  Administered 2014-04-14: 2.5 mg via RESPIRATORY_TRACT

## 2014-04-17 LAB — PULMONARY FUNCTION TEST
DL/VA % PRED: 55 %
DL/VA: 2.46 ml/min/mmHg/L
DLCO COR: 10 ml/min/mmHg
DLCO cor % pred: 35 %
DLCO unc % pred: 29 %
DLCO unc: 8.23 ml/min/mmHg
FEF 25-75 PRE: 1.71 L/s
FEF 25-75 Post: 2.91 L/sec
FEF2575-%CHANGE-POST: 69 %
FEF2575-%PRED-POST: 127 %
FEF2575-%Pred-Pre: 75 %
FEV1-%CHANGE-POST: 11 %
FEV1-%PRED-POST: 91 %
FEV1-%PRED-PRE: 81 %
FEV1-POST: 2.35 L
FEV1-PRE: 2.1 L
FEV1FVC-%CHANGE-POST: 4 %
FEV1FVC-%PRED-PRE: 100 %
FEV6-%Change-Post: 6 %
FEV6-%Pred-Post: 89 %
FEV6-%Pred-Pre: 83 %
FEV6-POST: 2.9 L
FEV6-Pre: 2.73 L
FEV6FVC-%PRED-PRE: 105 %
FEV6FVC-%Pred-Post: 105 %
FVC-%Change-Post: 6 %
FVC-%Pred-Post: 85 %
FVC-%Pred-Pre: 79 %
FVC-Post: 2.92 L
FVC-Pre: 2.73 L
POST FEV1/FVC RATIO: 81 %
PRE FEV6/FVC RATIO: 100 %
Post FEV6/FVC ratio: 100 %
Pre FEV1/FVC ratio: 77 %
RV % pred: 87 %
RV: 1.96 L
TLC % pred: 73 %
TLC: 4.7 L

## 2014-04-28 ENCOUNTER — Encounter: Payer: Self-pay | Admitting: *Deleted

## 2014-05-03 ENCOUNTER — Encounter: Payer: Self-pay | Admitting: Radiation Oncology

## 2014-05-03 ENCOUNTER — Ambulatory Visit
Admission: RE | Admit: 2014-05-03 | Discharge: 2014-05-03 | Disposition: A | Discharge status: Home / Self Care | Payer: Medicare Other | Source: Ambulatory Visit | Attending: Radiation Oncology | Admitting: Radiation Oncology

## 2014-05-03 VITALS — BP 132/74 | HR 77 | Temp 98.1°F | Resp 20 | Wt 200.9 lb

## 2014-05-03 DIAGNOSIS — C159 Malignant neoplasm of esophagus, unspecified: Secondary | ICD-10-CM

## 2014-05-03 NOTE — Progress Notes (Signed)
Patient denies pain, loss of appetite, fatigue. Pt states he occasionally "cuts the grass". He states he occasionally has mid sternal pain when he eats, but he is eating all foods except spicy. He drinks 2-3 nutritional supplements daily in addition to 3 meals a day. Pt has PET scan scheduled on 05/16/14.

## 2014-05-03 NOTE — Progress Notes (Signed)
Followup note:  [name] [name] returns today approximately 1 month following completion of chemoradiation in the management of his T3 N2 adenocarcinoma of the distal esophagus. He feels improved. He occasionally cuts his grass a. He is having less dysphagia/odynophagia. He believes that he is eating better and his appetite is beginning to return. His weight is up 3 pounds over the past month. He drinks 2-3 nutritional supplements a day in addition to eating 3 meals a day. He is scheduled for a staging PET scan later this month and then a followup visit with [name].  Physical examination: Alert and oriented. Filed Vitals:   05/03/14 1129  BP: 132/74  Pulse: 77  Temp: 98.1 F (36.7 C)  Resp: 20   Nodes: There is no palpable cervical or supraclavicular lymphadenopathy. Chest: Lungs clear. Abdomen: There is hyperpigmentation of the skin along the lower/upper abdomen. There is no hepatomegaly.  Laboratory data: Lab Results  Component Value Date   WBC 1.8* 04/04/2014   HGB 9.6* 04/04/2014   HCT 30.4* 04/04/2014   MCV 81.0 04/04/2014   PLT 166 04/04/2014   Impression: Satisfactory progress, and clinically improved.  Plan: He'll be restaged with a PET scan on August 17 and then a followup visit with [name] on August 19. Followup through [name].

## 2014-05-13 ENCOUNTER — Other Ambulatory Visit: Payer: Self-pay | Admitting: *Deleted

## 2014-05-13 DIAGNOSIS — J849 Interstitial pulmonary disease, unspecified: Secondary | ICD-10-CM

## 2014-05-13 DIAGNOSIS — C159 Malignant neoplasm of esophagus, unspecified: Secondary | ICD-10-CM

## 2014-05-13 DIAGNOSIS — C155 Malignant neoplasm of lower third of esophagus: Secondary | ICD-10-CM

## 2014-05-16 ENCOUNTER — Other Ambulatory Visit (HOSPITAL_BASED_OUTPATIENT_CLINIC_OR_DEPARTMENT_OTHER): Payer: Medicare Other

## 2014-05-16 ENCOUNTER — Ambulatory Visit (HOSPITAL_COMMUNITY)
Admission: RE | Admit: 2014-05-16 | Discharge: 2014-05-16 | Disposition: A | Discharge status: Home / Self Care | Payer: Medicare Other | Source: Ambulatory Visit | Attending: Diagnostic Radiology | Admitting: Diagnostic Radiology

## 2014-05-16 DIAGNOSIS — C159 Malignant neoplasm of esophagus, unspecified: Secondary | ICD-10-CM | POA: Insufficient documentation

## 2014-05-16 DIAGNOSIS — C7951 Secondary malignant neoplasm of bone: Secondary | ICD-10-CM | POA: Insufficient documentation

## 2014-05-16 DIAGNOSIS — K7689 Other specified diseases of liver: Secondary | ICD-10-CM | POA: Insufficient documentation

## 2014-05-16 DIAGNOSIS — C7952 Secondary malignant neoplasm of bone marrow: Secondary | ICD-10-CM

## 2014-05-16 DIAGNOSIS — J849 Interstitial pulmonary disease, unspecified: Secondary | ICD-10-CM

## 2014-05-16 DIAGNOSIS — C155 Malignant neoplasm of lower third of esophagus: Secondary | ICD-10-CM

## 2014-05-16 LAB — CBC WITH DIFFERENTIAL/PLATELET
BASO%: 0.8 % (ref 0.0–2.0)
Basophils Absolute: 0 10*3/uL (ref 0.0–0.1)
EOS ABS: 0.2 10*3/uL (ref 0.0–0.5)
EOS%: 3.3 % (ref 0.0–7.0)
HEMATOCRIT: 31.9 % — AB (ref 38.4–49.9)
HGB: 10 g/dL — ABNORMAL LOW (ref 13.0–17.1)
LYMPH#: 1 10*3/uL (ref 0.9–3.3)
LYMPH%: 19.2 % (ref 14.0–49.0)
MCH: 24.4 pg — ABNORMAL LOW (ref 27.2–33.4)
MCHC: 31.2 g/dL — AB (ref 32.0–36.0)
MCV: 78.3 fL — ABNORMAL LOW (ref 79.3–98.0)
MONO#: 0.9 10*3/uL (ref 0.1–0.9)
MONO%: 17.3 % — ABNORMAL HIGH (ref 0.0–14.0)
NEUT%: 59.4 % (ref 39.0–75.0)
NEUTROS ABS: 3.2 10*3/uL (ref 1.5–6.5)
Platelets: 245 10*3/uL (ref 140–400)
RBC: 4.08 10*6/uL — ABNORMAL LOW (ref 4.20–5.82)
RDW: 22.1 % — ABNORMAL HIGH (ref 11.0–14.6)
WBC: 5.4 10*3/uL (ref 4.0–10.3)

## 2014-05-16 LAB — COMPREHENSIVE METABOLIC PANEL (CC13)
ALT: 13 U/L (ref 0–55)
ANION GAP: 7 meq/L (ref 3–11)
AST: 16 U/L (ref 5–34)
Albumin: 3 g/dL — ABNORMAL LOW (ref 3.5–5.0)
Alkaline Phosphatase: 63 U/L (ref 40–150)
BUN: 12.7 mg/dL (ref 7.0–26.0)
CHLORIDE: 105 meq/L (ref 98–109)
CO2: 28 mEq/L (ref 22–29)
Calcium: 9.3 mg/dL (ref 8.4–10.4)
Creatinine: 0.9 mg/dL (ref 0.7–1.3)
Glucose: 101 mg/dl (ref 70–140)
Potassium: 3.8 mEq/L (ref 3.5–5.1)
Sodium: 140 mEq/L (ref 136–145)
TOTAL PROTEIN: 7 g/dL (ref 6.4–8.3)
Total Bilirubin: 0.5 mg/dL (ref 0.20–1.20)

## 2014-05-16 LAB — GLUCOSE, CAPILLARY: Glucose-Capillary: 103 mg/dL — ABNORMAL HIGH (ref 70–99)

## 2014-05-16 MED ORDER — FLUDEOXYGLUCOSE F - 18 (FDG) INJECTION
9.9500 | Freq: Once | INTRAVENOUS | Status: AC | PRN
  Administered 2014-05-16: 9.95 via INTRAVENOUS

## 2014-05-17 ENCOUNTER — Encounter: Payer: Self-pay | Admitting: Internal Medicine

## 2014-05-17 ENCOUNTER — Ambulatory Visit (INDEPENDENT_AMBULATORY_CARE_PROVIDER_SITE_OTHER): Payer: Medicare Other | Admitting: Internal Medicine

## 2014-05-17 VITALS — BP 118/76 | HR 83 | Ht 67.0 in | Wt 202.0 lb

## 2014-05-17 DIAGNOSIS — J8410 Pulmonary fibrosis, unspecified: Secondary | ICD-10-CM

## 2014-05-17 DIAGNOSIS — J849 Interstitial pulmonary disease, unspecified: Secondary | ICD-10-CM

## 2014-05-17 NOTE — Patient Instructions (Addendum)
#  ILD  - you have scarring disease in lungs called ILD; this has not resolved between may 2015 and 05/16/14 despite chemo raditaion - not due to autoimmune disease and not consistent with IPF disease -  could be dysphagia -> aspiration -> ILD or NSIP disease or cancer;  - You might need surgical lung biopsy to sort above out (updated 05/19/14: per Dr Julien Nordmann patient has metastatic disease so no lung bx but instead will Rx symptomatically - I will discuss with Dr Julien Nordmann and surgeons and get back to you within 10 days  -MEanwhile, do ONO Test on Room air   #Followup  - 4will call within 10 days; if not please call [phone] - update 05/19/2014: will review ono. Patient to return to see me in 3 months

## 2014-05-17 NOTE — Progress Notes (Signed)
Subjective:    Patient ID: [name] [name], male    DOB: 05/14/1946, 68 y.o.   MRN: [id number]  HPI   PCP [name] Palau, MD Onc: [name] [name] and [name] [name]: [name] [name]: [name] [name]   IOV 04/04/2014  Chief Complaint  Patient presents with  . Pulmonary Consult    Referred by [name] [name] for ILD    [name] [name] is a 68 y.o. male with a past medical history significant for hypertension, dyslipidemia, GERD as well as benign prostatic hypertrophy. The patient has been complaining of swallowing difficulty for 3-4 weeks prior to dx with increased mucus production and difficulty swallowing solid food. He has a history of acid reflux and has been on treatment with Nexium but he was not taking it on daily basis. He also lost around 50 pounds in the last few weeks prior to diagnosis. He was seen by his primary care physician and referred to [name]. Erskine [name] for evaluation. On 01/26/2014 he underwent upper endoscopy under the care of [name]. Deatra [name] and it showed an exophytic friable mass at approximately 35 CM from the incisors. The mass extended to the GE junction where there was a moderate stricture. Multiple biopsies were taken. The findings were concerning for esophageal neoplasm as well as Barrett's esophagus.   CT scan of the chest, abdomen and pelvis were performed on 01/28/2014. It showed marked irregular thickening of the distal esophagus at and immediately above the gastroesophageal junction, corresponds to known esophageal mass. Proximal to this, the esophagus is markedly dilated, with a a large air-fluid level. Prominent low right paratracheal lymph node measures 8 mm in short axis. Subcarinal  lymph node measuring 9 mm in short axis. No definite mediastinal or hilar lymph nodes meet criteria for pathologic enlargement at this  time. There was also enlarged lymph nodes in the gastrohepatic ligament measuring 1.6 CM in short axis concerning for nodal metastasis.   He has a  history of smoking 2 packs per day for around 35 years but quit 15 years ago. He also has a history of alcohol abuse but not recently and no history of drug abuse.   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  Concurrent chemoradiation with weekly chemotherapy in the form of carboplatin for an AUC of 2 and paclitaxel 45 mg per meter squared given weekly concurrent with radiation therapy. Currently the patient is scheduled to complete his last fraction of radiation on 04/01/2014.      He denies any shortness of breath or cough but he does have clubbing. The CT scan of the chest on 01/28/2014 suggested interstitial lung disease not consistent with IPF. There was concern by the radiologist for scleroderma so he was referred to [name]. Gavin [name] was on extensive autoimmune workup on 03/03/2014 and this was negative. Therefore he is being referred to me pulmonary. Continue to emphasize the patient is asymptomatic other than physical deconditioning and dysphagia from chemo/cancer    DISEASE STAGE:  Esophageal carcinoma  Primary site: Esophagus - Adenocarcinoma  Staging method: AJCC 7th Edition  Clinical: Stage IIIB (T3, N2, M0) Summary: Stage IIIB (T3, N2, M0)  CHEMOTHERAPY INTENT: Control/palliative  CURRENT # OF CHEMOTHERAPY CYCLES: 3 as of 03/21/14 CURRENT ANTIEMETICS: Compazine, dexamethasone  CURRENT SMOKING STATUS: Former smoker quit 09/30/1998   FINDINGS:  CT CHEST FINDINGS 01/28/14 - PRECHEMO  Mediastinum: Marked irregular thickening of the  distal esophagus at  and immediately above the gastroesophageal junction, corresponds to  known esophageal mass. Proximal to this, the esophagus is markedly  dilated, with a a large air-fluid level. Prominent low right  paratracheal lymph node measures 8 mm in short axis. Subcarinal  lymph node measuring 9 mm in short axis. No  definite mediastinal or  hilar lymph nodes meet criteria for pathologic enlargement at this  time. Heart size is normal. There is no significant pericardial  fluid, thickening or pericardial calcification.  Lungs/Pleura: Throughout the lung bases bilaterally there is  widespread ground-glass attenuation with associated septal  thickening, thickening of the peribronchovascular interstitium and  mild cylindrical bronchiectasis. 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 No acute consolidative  airspace disease. No pleural effusions. Multiple tiny calcified  granulomas throughout the lung bases bilaterally.  Musculoskeletal: There are no aggressive appearing lytic or blastic  lesions noted in the visualized portions of the skeleton.  CT ABDOMEN AND PELVIS FINDINGS  Abdomen/Pelvis: Enlarged lymph node in the gastrohepatic ligament  measuring 16 mm in short axis (image 55 of series 2), concerning for  nodal metastasis. The thickening of the gastroesophageal junction  does not involve the more distal portions of the stomach. The  appearance of the liver, gallbladder, pancreas, spleen and bilateral  adrenal glands is unremarkable. Sub cm low-attenuation lesions in  the kidneys bilaterally are too small to definitively characterize,  but are favored to represent tiny cysts. Normal appendix. No  significant volume of ascites. No pneumoperitoneum. No pathologic  distention of small bowel. Prostate gland and urinary bladder are  unremarkable in appearance.  Musculoskeletal: There are no aggressive appearing lytic or blastic  lesions noted in the visualized portions of the skeleton.  IMPRESSION:  1. Marked irregular masslike thickening at an immediately above the  gastroesophageal junction, compatible with the reported esophageal  neoplasm. 16 mm short axis gastrohepatic ligament lymph node is  concerning for a local nodal metastasis. Note definite signs of    distal metastatic disease in the chest, abdomen or pelvis on today's  examination.  2. 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 Rheumatologic consultation is suggested.  3. Additional incidental findings, as above.  Electronically Signed  By: Vinnie Langton M.D.  On: 01/28/2014 14:18    Autoimmune labs with [name]. Gavin [name] 02/28/14  Hepatitis virus panel is negative. C-reactive protein is 16 and elevated. HLA-B27 is negative ACE level is negative/normal at 23 Aanticentromere B. antibody, anti-Jo 1, Smith antibody, anti-scleroderma 70 antibody, Sjgren antibodies.  DNA, rheumatoid factor, creatinine kinase, uric acid total normal, CCP antibody is normal.  Sedimentation rate is elevated at 54. ANA is positive for trace. RNP antibody is elevated 1.6 mildly positive   Walk test 185 feet x 3 laps on RA: DID NOT DESAT but stopped at 2 laps due to fatigue   #ILD  - you have scarring disease in lungs called ILD - not due to autoimmune disease - CT not fully diagnostic of IPF - could be dysphagia -> aspiration -> ILD  - - do full PFT  - do walk test on room air - dp ONO test - NExt few  weeks I will contact [name] [name] to figure out his  thinking on your cancer   #Followup  - 4-6 weeks after full PFT  - At followup will discuss possible need for lung biopsy   OV 05/17/2014  Chief Complaint  Patient presents with  . Follow-up    Pt states his breathing is doing well. Pt c/o intermittent cough secondary to PND. Pt c/o pain in right hip and leg. Pt denies SOB and Cp/tightness.   Here to review workup for  interstitial lung disease in the setting of esophageal cancer. Interstitial lung disease findings noted even prior to radiation therapy  He has now completed chemotherapy and radiation therapy. Yesterday he had a PET scan and this shows persistent office interstitial lung disease without any change compared to May 2015. Of note, he admits that he had dysphagia prior to chemotherapy and radiation therapy but this has significantly resolved with the help of chemotherapy and radiation  Pulmonary function test 04/14/2014 shows FVC 2.9 L/85%. FEV1 2.3 fairly do/91%. Total lung capacity 4.7 L or 72%. He is here 10/35%. Essentially the restrictive pulmonary function test with significantly low diffusion capacity.   He has not had as overnight oxygen study.  He describes being fairly asymptomatic from a respiratory standpoint in terms of dyspnea and cough  D/w [name] [name] subsequently 05/19/2014 again: he has seen the patient and patient now has progressive metastatic disease in liver and bone and so biopsy of lung is not indicated.    Review of Systems  Constitutional: Negative for fever and unexpected weight change.  HENT: Positive for postnasal drip. Negative for congestion, dental problem, ear pain, nosebleeds, rhinorrhea, sinus pressure, sneezing, sore throat and trouble swallowing.   Eyes: Negative for redness and itching.  Respiratory: Positive for cough. Negative for chest tightness, shortness of breath and wheezing.   Cardiovascular: Negative for palpitations and leg swelling.  Gastrointestinal: Negative for nausea and vomiting.  Genitourinary: Negative for dysuria.  Musculoskeletal: Negative for joint swelling.  Skin: Negative for rash.  Neurological: Negative for headaches.  Hematological: Does not bruise/bleed easily.  Psychiatric/Behavioral: Negative for dysphoric mood. The patient is not nervous/anxious.        Objective:   Physical Exam  Filed Vitals:   05/17/14 0938  BP:  118/76  Pulse: 83  Height: 5' 7"  (1.702 m)  Weight: 202 lb (91.627 kg)  SpO2: 98%  ursing note and vitals reviewed. Constitutional: He is oriented to person, place, and time. He appears well-developed and well-nourished. No distress.  HENT:  Head: Normocephalic and atraumatic.  Right Ear: External ear normal.  Left Ear: External ear normal.  Mouth/Throat: Oropharynx is clear and moist. No oropharyngeal exudate.  Eyes: Conjunctivae and EOM are normal. Pupils are equal, round, and reactive to light. Right eye exhibits no discharge. Left eye exhibits no discharge. No scleral icterus.  Neck: Normal range of motion. Neck supple. No JVD present. No tracheal deviation present. No thyromegaly present.  Cardiovascular: Normal rate, regular rhythm and intact distal pulses.  Exam reveals no gallop and no friction rub.   No murmur heard. Pulmonary/Chest: Effort normal. No respiratory distress. He has no wheezes. He has rales. He exhibits no tenderness. Darklk skin and the back consistent with radiation effect  Mild basal rales  Abdominal: Soft. Bowel sounds are normal. He exhibits no distension and no mass. There is no tenderness. There is no rebound and no guarding.  Musculoskeletal: Normal range of motion. He exhibits no edema and no tenderness.  Clubbing + along with tight skin on hand but gives history of frost bite Lymphadenopathy:    He has no cervical adenopathy.  Neurological: He is alert and oriented to person, place, and time. He has normal reflexes. No cranial nerve deficit. Coordination normal.  Skin: Skin is warm and dry. No rash noted. He is not diaphoretic. No erythema. No pallor.  Psychiatric: He has a normal mood and affect. His behavior is normal. Judgment and thought content normal.          Assessment & Plan:  #ILD  - you have scarring disease in lungs called ILD; this has not resolved between may 2015 and 05/16/14 despite chemo raditaion - not due to autoimmune disease and  not consistent with IPF disease -  could be dysphagia -> aspiration -> ILD or NSIP disease or cancer;  - You might need surgical lung biopsy to sort above out - I will discuss with [name] [name] and surgeons and get back to you within 10 days  -MEanwhile, do ONO Test on Room air   #Followup  - 4will call within 10 days; if not please call [phone]  Update: [name] [name] explains 05/19/2014 that patient has metastatic cancer and therefore no need for lung bx. Will see patient in 12 weeks. Will followup on tests of ONO. Will treat symptom based

## 2014-05-18 ENCOUNTER — Telehealth: Payer: Self-pay | Admitting: Internal Medicine

## 2014-05-18 ENCOUNTER — Encounter: Payer: Self-pay | Admitting: Internal Medicine

## 2014-05-18 ENCOUNTER — Ambulatory Visit (HOSPITAL_BASED_OUTPATIENT_CLINIC_OR_DEPARTMENT_OTHER): Payer: Medicare Other | Admitting: Internal Medicine

## 2014-05-18 VITALS — BP 140/73 | HR 85 | Temp 98.4°F | Resp 20 | Ht 67.0 in | Wt 203.5 lb

## 2014-05-18 DIAGNOSIS — C159 Malignant neoplasm of esophagus, unspecified: Secondary | ICD-10-CM

## 2014-05-18 DIAGNOSIS — C155 Malignant neoplasm of lower third of esophagus: Secondary | ICD-10-CM

## 2014-05-18 MED ORDER — DEXAMETHASONE 4 MG PO TABS: ORAL_TABLET | ORAL | Status: DC

## 2014-05-18 MED ORDER — LIDOCAINE-PRILOCAINE 2.5-2.5 % EX CREA: 1.0000 "application " | TOPICAL_CREAM | CUTANEOUS | Status: DC | PRN

## 2014-05-18 NOTE — Telephone Encounter (Signed)
gv adn printed appt sched and av sfo rpt for Aug adn SEpt...sed added tx.[name] KitchenMarland KitchenMarland KitchenMarland Kitchenlvm for IR shw will call me and pt with port appt

## 2014-05-18 NOTE — Progress Notes (Signed)
[facility] [phone]   Fax:([phone]) [place], [place] [address]  DIAGNOSIS: Metastatic esophageal adenocarcinoma initially diagnosed as Locally advanced, stage IIIB (T3, N2, M0) distal esophageal adenocarcinoma diagnosed in May of 2015  PRIOR THERAPY: Concurrent chemoradiation with weekly carboplatin for AUC of 2 and paclitaxel 45 mg/M2 for 5 weeks. Last dose was given on 03/29/1999  CURRENT THERAPY: Systemic chemotherapy with cisplatin 75 mg/M2, docetaxel 75 mg/M2 on day 1 and 5-FU 750 mg/M2 continuous infusion for 5 days. For a cycle on 05/23/2014.  INTERVAL HISTORY: [name] [name] 68 y.o. male returns to the clinic today for followup visit. The patient tolerated the previous course of concurrent chemoradiation with weekly carboplatin and paclitaxel fairly well. He has significant improvement in his dysphagia. He starts gaining some weight. He continues to have shortness of breath especially with exertion and was recently seen by Dr. Chase Caller for evaluation of a questionable pulmonary fibrosis. The patient denied having any significant weight loss or night sweats. He has no nausea or vomiting. He denied having any significant fever or chills. He has no chest pain or hemoptysis. He denied having any significant nausea or vomiting but continues to have mild odynophagia with no change in his bowel movement. He had repeat PET scan performed recently and he is here for evaluation and discussion of his scan results and recommendation regarding treatment of his condition.   MEDICAL HISTORY: Past Medical History  Diagnosis Date  . GERD (gastroesophageal reflux disease)   . Hypertension   . Hyperlipemia   . 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 Barrett's esophagus 12/2013  . History of radiation therapy 02/23/14- 04/04/14    esophagus 5040 cGy 28 sessions    ALLERGIES:  has No  Known Allergies.  MEDICATIONS:  Current Outpatient Prescriptions  Medication Sig Dispense Refill  . Ascorbic Acid (VITAMIN C) 1000 MG tablet Take 1,000 mg by mouth daily.      . cloNIDine (CATAPRES) 0.1 MG tablet Take 0.1 mg by mouth every morning.       . dutasteride (AVODART) 0.5 MG capsule Take 0.5 mg by mouth every evening.       . Garlic 9675 MG CAPS Take 1 capsule by mouth daily.      . magnesium hydroxide (MILK OF MAGNESIA) 400 MG/5ML suspension Take 5 mLs by mouth daily as needed for mild constipation.      Marland Kitchen omega-3 acid ethyl esters (LOVAZA) 1 G capsule Take 1 g by mouth daily.      Marland Kitchen omeprazole (PRILOSEC) 40 MG capsule Take 40 mg by mouth daily.      . prochlorperazine (COMPAZINE) 10 MG tablet Take 1 tablet (10 mg total) by mouth every 6 (six) hours as needed for nausea or vomiting.  30 tablet  1  . simvastatin (ZOCOR) 40 MG tablet Take 40 mg by mouth daily as needed.       . valsartan-hydrochlorothiazide (DIOVAN-HCT) 160-25 MG per tablet Take 1 tablet by mouth every morning.       . vitamin E (VITAMIN E) 1000 UNIT capsule Take 1,000 Units by mouth daily.       No current facility-administered medications for this visit.    SURGICAL HISTORY:  Past Surgical History  Procedure Laterality Date  . Eus N/A 02/03/2014    Procedure: UPPER ENDOSCOPIC ULTRASOUND (EUS) LINEAR;  Surgeon: Milus Banister, MD;  Location:  [facility];  Service: Endoscopy;  Laterality: N/A;    REVIEW OF SYSTEMS:  Constitutional: positive for fatigue Eyes: negative Ears, nose, mouth, throat, and face: negative Respiratory: positive for dyspnea on exertion Cardiovascular: negative Gastrointestinal: positive for odynophagia Genitourinary:negative Integument/breast: negative Hematologic/lymphatic: negative Musculoskeletal:negative Neurological: negative Behavioral/Psych: negative Endocrine: negative Allergic/Immunologic: negative   PHYSICAL EXAMINATION: General appearance: alert, cooperative,  fatigued and no distress Head: Normocephalic, without obvious abnormality, atraumatic Neck: no adenopathy, no JVD, supple, symmetrical, trachea midline and thyroid not enlarged, symmetric, no tenderness/mass/nodules Lymph nodes: Cervical, supraclavicular, and axillary nodes normal. Resp: clear to auscultation bilaterally Back: symmetric, no curvature. ROM normal. No CVA tenderness. Cardio: regular rate and rhythm, S1, S2 normal, no murmur, click, rub or gallop GI: soft, non-tender; bowel sounds normal; no masses,  no organomegaly Extremities: extremities normal, atraumatic, no cyanosis or edema Neurologic: Alert and oriented X 3, normal strength and tone. Normal symmetric reflexes. Normal coordination and gait  ECOG PERFORMANCE STATUS: 1 - Symptomatic but completely ambulatory  Blood pressure 140/73, pulse 85, temperature 98.4 F (36.9 C), temperature source Oral, resp. rate 20, height 5\' 7"  (1.702 m), weight 203 lb 8 oz (92.307 kg), SpO2 100.00%.  LABORATORY DATA: Lab Results  Component Value Date   WBC 5.4 05/16/2014   HGB 10.0* 05/16/2014   HCT 31.9* 05/16/2014   MCV 78.3* 05/16/2014   PLT 245 05/16/2014      Chemistry      Component Value Date/Time   NA 140 05/16/2014 0820   NA 138 01/26/2014 1148   K 3.8 05/16/2014 0820   K 3.9 01/26/2014 1148   CL 104 01/26/2014 1148   CO2 28 05/16/2014 0820   CO2 29 01/26/2014 1148   BUN 12.7 05/16/2014 0820   BUN 11 01/26/2014 1148   CREATININE 0.9 05/16/2014 0820   CREATININE 1.0 01/26/2014 1148      Component Value Date/Time   CALCIUM 9.3 05/16/2014 0820   CALCIUM 8.8 01/26/2014 1148   ALKPHOS 63 05/16/2014 0820   ALKPHOS 62 01/26/2014 1148   AST 16 05/16/2014 0820   AST 12 01/26/2014 1148   ALT 13 05/16/2014 0820   ALT 9 01/26/2014 1148   BILITOT 0.50 05/16/2014 0820   BILITOT 0.6 01/26/2014 1148       RADIOGRAPHIC STUDIES: Nm Pet Image Restag (ps) Skull Base To Thigh  05/16/2014   CLINICAL DATA:  Subsequent treatment strategy for  esophageal cancer.  EXAM: NUCLEAR MEDICINE PET SKULL BASE TO THIGH  TECHNIQUE: 9.95 mCi F-18 FDG was injected intravenously. Full-ring PET imaging was performed from the skull base to thigh after the radiotracer. CT data was obtained and used for attenuation correction and anatomic localization.  FASTING BLOOD GLUCOSE:  Value: 103 mg/dl  COMPARISON:  02/11/2014  FINDINGS: NECK  No hypermetabolic lymph nodes in the neck.  CHEST  Dilated and patulous thoracic esophagus is again identified. Heart size appears normal. There is calcified atherosclerotic disease involving the thoracic aorta. Calcified prevascular lymph nodes are again noted. Persistent FDG uptake associated with the sub- carinal lymph node has an SUV max equal to 3.9. Previously 3.75. Interval resolution of hypermetabolic distal esophageal mass.  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 No hypermetabolic pulmonary nodule or mass identified on today's exam. There are small bilateral pleural effusions noted bilateral. New from previous exam.  ABDOMEN/PELVIS  Within the lateral segment of left hepatic lobe there is a low attenuation structure measuring  2.9 cm. This has an SUV max equal to 4.47. There has been resolution of previous hypermetabolic upper abdominal lymph nodes. Mild increased uptake associated with peripancreatic lymph node has an SUV max equal to 3.79. Previously 4.40.  SKELETON  Abnormal increased radiotracer uptake associated with the right fourth rib is again identified. On today's exam the SUV max is equal to 5.72. Previously 2.8 cm. On the corresponding CT images there is abnormal asymmetric sclerosis within this rib compatible with metastasis.  IMPRESSION: 1. Mixed response to therapy. There has been resolution of abnormal hyper metabolism associated with the distal esophageal mass. 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 2. 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 3. Suspicious focus of increased uptake within the lateral segment of left hepatic lobe with associated the low-attenuation lesion lung corresponding CT images. This is worrisome for liver metastasis. Contrast enhanced MRI of the liver may provide further characterization of this lesion. 4. Stable, increased radiotracer uptake associated with sub carinal lymph node which in the setting of interstitial lung disease is nonspecific.   Electronically Signed   By: Kerby Moors M.D.   On: 05/16/2014 11:55   ASSESSMENT AND PLAN: This is a very pleasant 68 years old [place] American male with metastatic esophageal adenocarcinoma initially diagnosed as locally advanced disease status post a course of concurrent chemoradiation with weekly carboplatin and paclitaxel. His recent PET scan showed evidence for disease progression with new liver lesion in addition to metastatic bone lesions but there was significant improvement in the distal esophageal mass with resolution of the dysphagia. I have a lengthy discussion with the patient today about his current disease status and treatment options. I gave the patient the option of palliative care and hospice referral versus consideration of systemic chemotherapy with cisplatin, docetaxel and continuous infusion 5-FU every 3 weeks. I also discussed with the patient other treatment options including treatment with chemotherapy options like EOX or EOF if he is intolerable to the treatment with cisplatin and docetaxel and 5-FU. The patient agreed to proceed with the chemotherapy. I discussed with him the adverse effect including but not limited to alopecia, myelosuppression, nausea and vomiting, peripheral neuropathy, liver or renal dysfunction. I would his pharmacy was  prescription for Decadron 8 mg by mouth twice a day the day before, day of and day after the chemotherapy every 3 weeks. He'll also have prescription for Emla Cream. I will also arrange for the patient to have a Port-A-Cath placed by interventional radiology before starting the first cycle of his chemotherapy. He would come back for followup visit in 2 weeks for reevaluation and management any adverse effect of his treatment. The patient voices understanding of current disease status and treatment options and is in agreement with the current care plan.  All questions were answered. The patient knows to call the clinic with any problems, questions or concerns. We can certainly see the patient much sooner if necessary.  I spent 20 minutes counseling the patient face to face. The total time spent in the appointment was 30 minutes.  Disclaimer: This note was dictated with voice recognition software. Similar sounding words can inadvertently be transcribed and may not be corrected upon review.

## 2014-05-19 ENCOUNTER — Encounter (HOSPITAL_COMMUNITY): Payer: Self-pay | Admitting: Pharmacy Technician

## 2014-05-19 ENCOUNTER — Telehealth: Payer: Self-pay | Admitting: Internal Medicine

## 2014-05-19 ENCOUNTER — Other Ambulatory Visit: Payer: Self-pay | Admitting: Radiology

## 2014-05-19 DIAGNOSIS — J849 Interstitial pulmonary disease, unspecified: Secondary | ICD-10-CM

## 2014-05-19 NOTE — Telephone Encounter (Signed)
[name] [name]  Let patient know [name] know that I spoke to Dr Julien Nordmann and due to progressive disease and need for more chemo no need for lung bx. He is to do his ONO and I will review results and tell him if he needs o2 at night.   Otherwise give fu in 3 months  Dr. Brand Males, M.D., [facility].C.P Pulmonary and Critical Care Medicine Staff Physician [facility] Pager: [phone], If no answer or between  15:00h - 7:00h: call [phone]  05/19/2014 6:57 AM

## 2014-05-19 NOTE — Telephone Encounter (Signed)
Called and spoke to pt. Informed pt of the recs per MR. 3 month recall placed. Pt verbalized understanding and denied any further questions or concerns at this time.

## 2014-05-20 ENCOUNTER — Ambulatory Visit (HOSPITAL_COMMUNITY)
Admission: RE | Admit: 2014-05-20 | Discharge: 2014-05-20 | Disposition: A | Discharge status: Home / Self Care | Payer: Medicare Other | Source: Ambulatory Visit | Attending: Internal Medicine | Admitting: Internal Medicine

## 2014-05-20 ENCOUNTER — Other Ambulatory Visit: Payer: Self-pay | Admitting: Internal Medicine

## 2014-05-20 ENCOUNTER — Encounter (HOSPITAL_COMMUNITY): Payer: Self-pay

## 2014-05-20 DIAGNOSIS — Z87891 Personal history of nicotine dependence: Secondary | ICD-10-CM | POA: Insufficient documentation

## 2014-05-20 DIAGNOSIS — C159 Malignant neoplasm of esophagus, unspecified: Secondary | ICD-10-CM | POA: Diagnosis not present

## 2014-05-20 DIAGNOSIS — Z8614 Personal history of Methicillin resistant Staphylococcus aureus infection: Secondary | ICD-10-CM | POA: Diagnosis not present

## 2014-05-20 DIAGNOSIS — E785 Hyperlipidemia, unspecified: Secondary | ICD-10-CM | POA: Insufficient documentation

## 2014-05-20 DIAGNOSIS — K219 Gastro-esophageal reflux disease without esophagitis: Secondary | ICD-10-CM | POA: Insufficient documentation

## 2014-05-20 DIAGNOSIS — I10 Essential (primary) hypertension: Secondary | ICD-10-CM | POA: Insufficient documentation

## 2014-05-20 DIAGNOSIS — K2270 Barrett's esophagus without dysplasia: Secondary | ICD-10-CM | POA: Diagnosis not present

## 2014-05-20 DIAGNOSIS — Z923 Personal history of irradiation: Secondary | ICD-10-CM | POA: Insufficient documentation

## 2014-05-20 DIAGNOSIS — Z452 Encounter for adjustment and management of vascular access device: Secondary | ICD-10-CM | POA: Diagnosis present

## 2014-05-20 LAB — CBC WITH DIFFERENTIAL/PLATELET
BASOS ABS: 0 10*3/uL (ref 0.0–0.1)
Basophils Relative: 0 % (ref 0–1)
EOS ABS: 0.1 10*3/uL (ref 0.0–0.7)
EOS PCT: 2 % (ref 0–5)
HCT: 31.3 % — ABNORMAL LOW (ref 39.0–52.0)
Hemoglobin: 9.8 g/dL — ABNORMAL LOW (ref 13.0–17.0)
Lymphocytes Relative: 24 % (ref 12–46)
Lymphs Abs: 1.2 10*3/uL (ref 0.7–4.0)
MCH: 24.5 pg — AB (ref 26.0–34.0)
MCHC: 31.3 g/dL (ref 30.0–36.0)
MCV: 78.3 fL (ref 78.0–100.0)
Monocytes Absolute: 0.8 10*3/uL (ref 0.1–1.0)
Monocytes Relative: 17 % — ABNORMAL HIGH (ref 3–12)
Neutro Abs: 2.7 10*3/uL (ref 1.7–7.7)
Neutrophils Relative %: 57 % (ref 43–77)
Platelets: 194 10*3/uL (ref 150–400)
RBC: 4 MIL/uL — ABNORMAL LOW (ref 4.22–5.81)
RDW: 19.8 % — AB (ref 11.5–15.5)
WBC: 4.7 10*3/uL (ref 4.0–10.5)

## 2014-05-20 LAB — PROTIME-INR
INR: 0.99 (ref 0.00–1.49)
Prothrombin Time: 13.1 seconds (ref 11.6–15.2)

## 2014-05-20 MED ORDER — CEFAZOLIN SODIUM-DEXTROSE 2-3 GM-% IV SOLR
2.0000 g | Freq: Once | INTRAVENOUS | Status: AC
  Administered 2014-05-20: 2 g via INTRAVENOUS

## 2014-05-20 MED ORDER — FENTANYL CITRATE 0.05 MG/ML IJ SOLN
INTRAMUSCULAR | Status: AC | PRN
  Administered 2014-05-20 (×2): 50 ug via INTRAVENOUS

## 2014-05-20 MED ORDER — HEPARIN SOD (PORK) LOCK FLUSH 100 UNIT/ML IV SOLN
INTRAVENOUS | Status: AC
  Filled 2014-05-20: qty 5

## 2014-05-20 MED ORDER — LIDOCAINE HCL 1 % IJ SOLN
INTRAMUSCULAR | Status: AC
  Filled 2014-05-20: qty 20

## 2014-05-20 MED ORDER — MIDAZOLAM HCL 2 MG/2ML IJ SOLN
INTRAMUSCULAR | Status: AC | PRN
  Administered 2014-05-20 (×2): 1 mg via INTRAVENOUS

## 2014-05-20 MED ORDER — SODIUM CHLORIDE 0.9 % IV SOLN
INTRAVENOUS | Status: DC
  Administered 2014-05-20: 13:00:00 via INTRAVENOUS

## 2014-05-20 MED ORDER — FENTANYL CITRATE 0.05 MG/ML IJ SOLN
INTRAMUSCULAR | Status: AC
  Filled 2014-05-20: qty 4

## 2014-05-20 MED ORDER — MIDAZOLAM HCL 2 MG/2ML IJ SOLN
INTRAMUSCULAR | Status: AC
  Filled 2014-05-20: qty 4

## 2014-05-20 MED ORDER — HEPARIN SOD (PORK) LOCK FLUSH 100 UNIT/ML IV SOLN
500.0000 [IU] | Freq: Once | INTRAVENOUS | Status: AC
  Administered 2014-05-20: 500 [IU] via INTRAVENOUS

## 2014-05-20 MED ORDER — CEFAZOLIN SODIUM-DEXTROSE 2-3 GM-% IV SOLR
INTRAVENOUS | Status: AC
  Filled 2014-05-20: qty 50

## 2014-05-20 NOTE — H&P (Signed)
Agree.  For port placement today. 

## 2014-05-20 NOTE — Procedures (Signed)
Procedure:  Port placement Access:  Right IJ vein SL Power Port placed.  Tip at cavoatrial junction.  OK to use.

## 2014-05-20 NOTE — H&P (Signed)
Chief Complaint: "I am here for a port." Referring Physician: [name] HPI: [name] [name] is an 68 y.o. male with metastatic esophageal adenocarcinoma s/p chemoradiation and now with progression of disease. He is scheduled for chemotherapy 05/23/14 and here today to have a port a catheter placed. He denies any chest pain, shortness of breath or palpitations. He denies any active signs of bleeding or excessive bruising. He denies any recent fever or chills. The patient denies any history of sleep apnea or chronic oxygen use. He has previously tolerated sedation without complications.   Past Medical History:  Past Medical History  Diagnosis Date  . GERD (gastroesophageal reflux disease)   . Hypertension   . Hyperlipemia   . 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 Barrett's esophagus 12/2013  . History of radiation therapy 02/23/14- 04/04/14    esophagus 5040 cGy 28 sessions    Past Surgical History:  Past Surgical History  Procedure Laterality Date  . Eus N/A 02/03/2014    Procedure: UPPER ENDOSCOPIC ULTRASOUND (EUS) LINEAR;  Surgeon: Milus Banister, MD;  Location: [facility];  Service: Endoscopy;  Laterality: N/A;    Family History:  Family History  Problem Relation Age of Onset  . Breast cancer Sister     Social History:  reports that he quit smoking about 25 years ago. His smoking use included Cigarettes. He has a 70 pack-year smoking history. He has never used smokeless tobacco. He reports that he drinks alcohol. He reports that he does not use illicit drugs.  Allergies: No Known Allergies  Medications:   Medication List    ASK your doctor about these medications       cloNIDine 0.1 MG tablet  Commonly known as:  CATAPRES  Take 0.1 mg by mouth daily as needed (will take with diovan hct if blood pressure is greater than 130/80).     dexamethasone 4 MG tablet  Commonly known as:  DECADRON  2 tab po bid the day before, day of and day after chemo every 3 weeks.     dutasteride 0.5 MG capsule  Commonly known as:  AVODART  Take 0.5 mg by mouth every evening.     Fish Oil 1000 MG Caps  Take 1,000 mg by mouth daily.     Garlic 4503 MG Caps  Take 1 capsule by mouth daily.     lidocaine-prilocaine cream  Commonly known as:  EMLA  Apply 1 application topically as needed.     magnesium hydroxide 400 MG/5ML suspension  Commonly known as:  MILK OF MAGNESIA  Take 5 mLs by mouth daily as needed for mild constipation.     omeprazole 40 MG capsule  Commonly known as:  PRILOSEC  Take 40 mg by mouth daily.     prochlorperazine 10 MG tablet  Commonly known as:  COMPAZINE  Take 1 tablet (10 mg total) by mouth every 6 (six) hours as needed for nausea or vomiting.     simvastatin 40 MG tablet  Commonly known as:  ZOCOR  Take 40 mg by mouth daily.     valsartan-hydrochlorothiazide 160-25 MG per tablet  Commonly known as:  DIOVAN-HCT  Take 1 tablet by mouth daily as needed (If blood pressure is greater than 130/80).     vitamin E 1000 UNIT capsule  Generic drug:  vitamin E  Take 1,000 Units by mouth daily.       Please HPI for pertinent positives, otherwise complete 10 system ROS negative.  Physical Exam:  BP 137/84  Pulse 84  Temp(Src) 98.1 F (36.7 C) (Oral)  Resp 18  SpO2 100% There is no weight on file to calculate BMI.  General Appearance:  Alert, cooperative, no distress  Head:  Normocephalic, without obvious abnormality, atraumatic  Neck: Supple, symmetrical, trachea midline  Lungs:   Clear to auscultation bilaterally, no w/r/r, respirations unlabored without use of accessory muscles.  Chest Wall:  No tenderness or deformity  Heart:  Regular rate and rhythm, S1, S2 normal, no murmur, rub or gallop.  Abdomen:   Soft, non-tender, non distended, (+) BS  Extremities: Extremities normal, atraumatic, no cyanosis or edema  Neurologic: Normal affect, no gross deficits.   Results for orders placed during the hospital encounter of 05/20/14  (from the past 48 hour(s))  CBC WITH DIFFERENTIAL     Status: Abnormal   Collection Time    05/20/14  1:00 PM      Result Value Ref Range   WBC 4.7  4.0 - 10.5 K/uL   RBC 4.00 (*) 4.22 - 5.81 MIL/uL   Hemoglobin 9.8 (*) 13.0 - 17.0 g/dL   HCT 31.3 (*) 39.0 - 52.0 %   MCV 78.3  78.0 - 100.0 fL   MCH 24.5 (*) 26.0 - 34.0 pg   MCHC 31.3  30.0 - 36.0 g/dL   RDW 19.8 (*) 11.5 - 15.5 %   Platelets 194  150 - 400 K/uL   Neutrophils Relative % 57  43 - 77 %   Neutro Abs 2.7  1.7 - 7.7 K/uL   Lymphocytes Relative 24  12 - 46 %   Lymphs Abs 1.2  0.7 - 4.0 K/uL   Monocytes Relative 17 (*) 3 - 12 %   Monocytes Absolute 0.8  0.1 - 1.0 K/uL   Eosinophils Relative 2  0 - 5 %   Eosinophils Absolute 0.1  0.0 - 0.7 K/uL   Basophils Relative 0  0 - 1 %   Basophils Absolute 0.0  0.0 - 0.1 K/uL   No results found.  Assessment/Plan Metastatic esophageal adenocarcinoma.  Scheduled today for port a catheter placement , chemotherapy to start 05/23/14 Patient has been NPO, no blood thinners taken, afebrile, labs reviewed, ancef ordered. Risks and Benefits discussed with the patient. All of the patient's questions were answered, patient is agreeable to proceed. Consent signed and in chart.   Tsosie Billing D PA-C 05/20/2014, 1:18 PM

## 2014-05-20 NOTE — Discharge Instructions (Signed)
Implanted Port Home Guide °An implanted port is a type of central line that is placed under the skin. Central lines are used to provide IV access when treatment or nutrition needs to be given through a person's veins. Implanted ports are used for long-term IV access. An implanted port may be placed because:  °· You need IV medicine that would be irritating to the small veins in your hands or arms.   °· You need long-term IV medicines, such as antibiotics.   °· You need IV nutrition for a long period.   °· You need frequent blood draws for lab tests.   °· You need dialysis.   °Implanted ports are usually placed in the chest area, but they can also be placed in the upper arm, the abdomen, or the leg. An implanted port has two main parts:  °· Reservoir. The reservoir is round and will appear as a small, raised area under your skin. The reservoir is the part where a needle is inserted to give medicines or draw blood.   °· Catheter. The catheter is a thin, flexible tube that extends from the reservoir. The catheter is placed into a large vein. Medicine that is inserted into the reservoir goes into the catheter and then into the vein.   °HOW WILL I CARE FOR MY INCISION SITE? °Do not get the incision site wet. Bathe or shower as directed by your health care provider.  °HOW IS MY PORT ACCESSED? °Special steps must be taken to access the port:  °· Before the port is accessed, a numbing cream can be placed on the skin. This helps numb the skin over the port site.   °· Your health care provider uses a sterile technique to access the port. °· Your health care provider must put on a mask and sterile gloves. °· The skin over your port is cleaned carefully with an antiseptic and allowed to dry. °· The port is gently pinched between sterile gloves, and a needle is inserted into the port. °· Only "non-coring" port needles should be used to access the port. Once the port is accessed, a blood return should be checked. This helps  ensure that the port is in the vein and is not clogged.   °· If your port needs to remain accessed for a constant infusion, a clear (transparent) bandage will be placed over the needle site. The bandage and needle will need to be changed every week, or as directed by your health care provider.   °· Keep the bandage covering the needle clean and dry. Do not get it wet. Follow your health care provider's instructions on how to take a shower or bath while the port is accessed.   °· If your port does not need to stay accessed, no bandage is needed over the port.   °WHAT IS FLUSHING? °Flushing helps keep the port from getting clogged. Follow your health care provider's instructions on how and when to flush the port. Ports are usually flushed with saline solution or a medicine called heparin. The need for flushing will depend on how the port is used.  °· If the port is used for intermittent medicines or blood draws, the port will need to be flushed:   °· After medicines have been given.   °· After blood has been drawn.   °· As part of routine maintenance.   °· If a constant infusion is running, the port may not need to be flushed.   °HOW LONG WILL MY PORT STAY IMPLANTED? °The port can stay in for as long as your health care   provider thinks it is needed. When it is time for the port to come out, surgery will be done to remove it. The procedure is similar to the one performed when the port was put in.  °WHEN SHOULD I SEEK IMMEDIATE MEDICAL CARE? °When you have an implanted port, you should seek immediate medical care if:  °· You notice a bad smell coming from the incision site.   °· You have swelling, redness, or drainage at the incision site.   °· You have more swelling or pain at the port site or the surrounding area.   °· You have a fever that is not controlled with medicine. °Document Released: 09/16/2005 Document Revised: 07/07/2013 Document Reviewed: 05/24/2013 °Exit[employer]® Patient Information ©2015 [employer]. This  information is not intended to replace advice given to you by your health care provider. Make sure you discuss any questions you have with your health care provider. ° °Implanted Port Insertion, Care After °Refer to this sheet in the next few weeks. These instructions provide you with information on caring for yourself after your procedure. Your health care provider may also give you more specific instructions. Your treatment has been planned according to current medical practices, but problems sometimes occur. Call your health care provider if you have any problems or questions after your procedure. °WHAT TO EXPECT AFTER THE PROCEDURE °After your procedure, it is typical to have the following:  °· Discomfort at the port insertion site. Ice packs to the area will help. °· Bruising on the skin over the port. This will subside in 3-4 days. °HOME CARE INSTRUCTIONS °· After your port is placed, you will get a manufacturer's information card. The card has information about your port. Keep this card with you at all times.   °· Know what kind of port you have. There are many types of ports available.   °· Wear a medical alert bracelet in case of an emergency. This can help alert health care workers that you have a port.   °· The port can stay in for as long as your health care provider believes it is necessary.   °· A home health care nurse may give medicines and take care of the port.   °· You or a family member can get special training and directions for giving medicine and taking care of the port at home.   °SEEK MEDICAL CARE IF:  °· Your port does not flush or you are unable to get a blood return.   °· You have a fever or chills. °SEEK IMMEDIATE MEDICAL CARE IF: °· You have new fluid or pus coming from your incision.   °· You notice a bad smell coming from your incision site.   °· You have swelling, pain, or more redness at the incision or port site.   °· You have chest pain or shortness of breath. °Document Released:  07/07/2013 Document Revised: 09/21/2013 Document Reviewed: 07/07/2013 °Exit[employer]® Patient Information ©2015 [employer]. This information is not intended to replace advice given to you by your health care provider. Make sure you discuss any questions you have with your health care provider. ° °Conscious Sedation, Adult, Care After °Refer to this sheet in the next few weeks. These instructions provide you with information on caring for yourself after your procedure. Your health care provider may also give you more specific instructions. Your treatment has been planned according to current medical practices, but problems sometimes occur. Call your health care provider if you have any problems or questions after your procedure. °WHAT TO EXPECT AFTER THE PROCEDURE  °After   your procedure: °· You may feel sleepy, clumsy, and have poor balance for several hours. °· Vomiting may occur if you eat too soon after the procedure. °HOME CARE INSTRUCTIONS °· Do not participate in any activities where you could become injured for at least 24 hours. Do not: °¨ Drive. °¨ Swim. °¨ Ride a bicycle. °¨ Operate heavy machinery. °¨ Cook. °¨ Use power tools. °¨ Climb ladders. °¨ Work from a high place. °· Do not make important decisions or sign legal documents until you are improved. °· If you vomit, drink water, juice, or soup when you can drink without vomiting. Make sure you have little or no nausea before eating solid foods. °· Only take over-the-counter or prescription medicines for pain, discomfort, or fever as directed by your health care provider. °· Make sure you and your family fully understand everything about the medicines given to you, including what side effects may occur. °· You should not drink alcohol, take sleeping pills, or take medicines that cause drowsiness for at least 24 hours. °· If you smoke, do not smoke without supervision. °· If you are feeling better, you may resume normal activities 24 hours after you were  sedated. °· Keep all appointments with your health care provider. °SEEK MEDICAL CARE IF: °· Your skin is pale or bluish in color. °· You continue to feel nauseous or vomit. °· Your pain is getting worse and is not helped by medicine. °· You have bleeding or swelling. °· You are still sleepy or feeling clumsy after 24 hours. °SEEK IMMEDIATE MEDICAL CARE IF: °· You develop a rash. °· You have difficulty breathing. °· You develop any type of allergic problem. °· You have a fever. °MAKE SURE YOU: °· Understand these instructions. °· Will watch your condition. °· Will get help right away if you are not doing well or get worse. °Document Released: 07/07/2013 Document Reviewed: 07/07/2013 °Exit[employer]® Patient Information ©2015 [employer]. This information is not intended to replace advice given to you by your health care provider. Make sure you discuss any questions you have with your health care provider. ° ° ° ° °

## 2014-05-22 NOTE — Assessment & Plan Note (Signed)
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 Will see patient in 12 weeks. Will followup on tests of ONO. Will treat symptom based  > 50% of this > 25 min visit spent in face to face counseling (15 min visit converted to 25 min)

## 2014-05-23 ENCOUNTER — Other Ambulatory Visit (HOSPITAL_BASED_OUTPATIENT_CLINIC_OR_DEPARTMENT_OTHER): Payer: Medicare Other

## 2014-05-23 ENCOUNTER — Ambulatory Visit (HOSPITAL_BASED_OUTPATIENT_CLINIC_OR_DEPARTMENT_OTHER): Payer: Medicare Other

## 2014-05-23 VITALS — BP 140/83 | HR 72 | Temp 98.4°F | Resp 18

## 2014-05-23 DIAGNOSIS — C159 Malignant neoplasm of esophagus, unspecified: Secondary | ICD-10-CM

## 2014-05-23 DIAGNOSIS — C155 Malignant neoplasm of lower third of esophagus: Secondary | ICD-10-CM

## 2014-05-23 DIAGNOSIS — Z5111 Encounter for antineoplastic chemotherapy: Secondary | ICD-10-CM

## 2014-05-23 LAB — CBC WITH DIFFERENTIAL/PLATELET
BASO%: 0.8 % (ref 0.0–2.0)
Basophils Absolute: 0 10*3/uL (ref 0.0–0.1)
EOS ABS: 0.1 10*3/uL (ref 0.0–0.5)
EOS%: 2.4 % (ref 0.0–7.0)
HCT: 32.6 % — ABNORMAL LOW (ref 38.4–49.9)
HGB: 10 g/dL — ABNORMAL LOW (ref 13.0–17.1)
LYMPH#: 0.9 10*3/uL (ref 0.9–3.3)
LYMPH%: 16.3 % (ref 14.0–49.0)
MCH: 24.2 pg — ABNORMAL LOW (ref 27.2–33.4)
MCHC: 30.6 g/dL — ABNORMAL LOW (ref 32.0–36.0)
MCV: 79 fL — AB (ref 79.3–98.0)
MONO#: 0.8 10*3/uL (ref 0.1–0.9)
MONO%: 14.5 % — AB (ref 0.0–14.0)
NEUT%: 66 % (ref 39.0–75.0)
NEUTROS ABS: 3.5 10*3/uL (ref 1.5–6.5)
Platelets: 210 10*3/uL (ref 140–400)
RBC: 4.13 10*6/uL — AB (ref 4.20–5.82)
RDW: 21.9 % — AB (ref 11.0–14.6)
WBC: 5.3 10*3/uL (ref 4.0–10.3)

## 2014-05-23 LAB — COMPREHENSIVE METABOLIC PANEL (CC13)
ALBUMIN: 3 g/dL — AB (ref 3.5–5.0)
ALK PHOS: 65 U/L (ref 40–150)
ALT: 10 U/L (ref 0–55)
AST: 15 U/L (ref 5–34)
Anion Gap: 8 mEq/L (ref 3–11)
BUN: 13.9 mg/dL (ref 7.0–26.0)
CHLORIDE: 106 meq/L (ref 98–109)
CO2: 27 mEq/L (ref 22–29)
Calcium: 9 mg/dL (ref 8.4–10.4)
Creatinine: 0.9 mg/dL (ref 0.7–1.3)
Glucose: 123 mg/dl (ref 70–140)
Potassium: 3.8 mEq/L (ref 3.5–5.1)
SODIUM: 140 meq/L (ref 136–145)
Total Bilirubin: 0.64 mg/dL (ref 0.20–1.20)
Total Protein: 7 g/dL (ref 6.4–8.3)

## 2014-05-23 LAB — MAGNESIUM (CC13): Magnesium: 1.9 mg/dl (ref 1.5–2.5)

## 2014-05-23 MED ORDER — SODIUM CHLORIDE 0.9 % IV SOLN
Freq: Once | INTRAVENOUS | Status: AC
  Administered 2014-05-23: 09:00:00 via INTRAVENOUS

## 2014-05-23 MED ORDER — DEXTROSE 5 % IV SOLN
75.0000 mg/m2 | Freq: Once | INTRAVENOUS | Status: AC
  Administered 2014-05-23: 160 mg via INTRAVENOUS
  Filled 2014-05-23: qty 16

## 2014-05-23 MED ORDER — SODIUM CHLORIDE 0.9 % IV SOLN
750.0000 mg/m2/d | INTRAVENOUS | Status: DC
  Administered 2014-05-23: 7850 mg via INTRAVENOUS
  Filled 2014-05-23: qty 157

## 2014-05-23 MED ORDER — POTASSIUM CHLORIDE 2 MEQ/ML IV SOLN
Freq: Once | INTRAVENOUS | Status: AC
  Administered 2014-05-23: 09:00:00 via INTRAVENOUS
  Filled 2014-05-23: qty 10

## 2014-05-23 MED ORDER — PALONOSETRON HCL INJECTION 0.25 MG/5ML
INTRAVENOUS | Status: AC
  Filled 2014-05-23: qty 5

## 2014-05-23 MED ORDER — PALONOSETRON HCL INJECTION 0.25 MG/5ML
0.2500 mg | Freq: Once | INTRAVENOUS | Status: AC
  Administered 2014-05-23: 0.25 mg via INTRAVENOUS

## 2014-05-23 MED ORDER — SODIUM CHLORIDE 0.9 % IV SOLN
150.0000 mg | Freq: Once | INTRAVENOUS | Status: AC
  Administered 2014-05-23: 150 mg via INTRAVENOUS
  Filled 2014-05-23: qty 5

## 2014-05-23 MED ORDER — DEXAMETHASONE SODIUM PHOSPHATE 20 MG/5ML IJ SOLN
INTRAMUSCULAR | Status: AC
  Filled 2014-05-23: qty 5

## 2014-05-23 MED ORDER — SODIUM CHLORIDE 0.9 % IV SOLN
75.0000 mg/m2 | Freq: Once | INTRAVENOUS | Status: AC
  Administered 2014-05-23: 157 mg via INTRAVENOUS
  Filled 2014-05-23: qty 157

## 2014-05-23 MED ORDER — DEXAMETHASONE SODIUM PHOSPHATE 20 MG/5ML IJ SOLN
12.0000 mg | Freq: Once | INTRAMUSCULAR | Status: AC
  Administered 2014-05-23: 12 mg via INTRAVENOUS

## 2014-05-23 NOTE — Patient Instructions (Signed)
[facility] Discharge Instructions for Patients Receiving Chemotherapy  Today you received the following chemotherapy agents Cisplatin, Taxotere and 5FU.  To help prevent nausea and vomiting after your treatment, we encourage you to take your nausea medication.  If you develop nausea and vomiting that is not controlled by your nausea medication, call the clinic.   BELOW ARE SYMPTOMS THAT SHOULD BE REPORTED IMMEDIATELY:  *FEVER GREATER THAN 100.5 F  *CHILLS WITH OR WITHOUT FEVER  NAUSEA AND VOMITING THAT IS NOT CONTROLLED WITH YOUR NAUSEA MEDICATION  *UNUSUAL SHORTNESS OF BREATH  *UNUSUAL BRUISING OR BLEEDING  TENDERNESS IN MOUTH AND THROAT WITH OR WITHOUT PRESENCE OF ULCERS  *URINARY PROBLEMS  *BOWEL PROBLEMS  UNUSUAL RASH Items with * indicate a potential emergency and should be followed up as soon as possible.  Feel free to call the clinic you have any questions or concerns. The clinic phone number is [phone].

## 2014-05-28 ENCOUNTER — Ambulatory Visit (HOSPITAL_BASED_OUTPATIENT_CLINIC_OR_DEPARTMENT_OTHER): Payer: Medicare Other

## 2014-05-28 VITALS — BP 133/69 | Temp 98.0°F | Resp 18

## 2014-05-28 DIAGNOSIS — C159 Malignant neoplasm of esophagus, unspecified: Secondary | ICD-10-CM

## 2014-05-28 DIAGNOSIS — C155 Malignant neoplasm of lower third of esophagus: Secondary | ICD-10-CM

## 2014-05-28 MED ORDER — HEPARIN SOD (PORK) LOCK FLUSH 100 UNIT/ML IV SOLN
500.0000 [IU] | Freq: Once | INTRAVENOUS | Status: AC | PRN
  Administered 2014-05-28: 500 [IU]
  Filled 2014-05-28: qty 5

## 2014-05-28 MED ORDER — SODIUM CHLORIDE 0.9 % IJ SOLN
10.0000 mL | INTRAMUSCULAR | Status: DC | PRN
  Administered 2014-05-28: 10 mL
  Filled 2014-05-28: qty 10

## 2014-05-30 ENCOUNTER — Other Ambulatory Visit (HOSPITAL_BASED_OUTPATIENT_CLINIC_OR_DEPARTMENT_OTHER): Payer: Medicare Other

## 2014-05-30 ENCOUNTER — Encounter: Payer: Self-pay | Admitting: Physician Assistant

## 2014-05-30 ENCOUNTER — Ambulatory Visit (HOSPITAL_BASED_OUTPATIENT_CLINIC_OR_DEPARTMENT_OTHER): Payer: Medicare Other | Admitting: Physician Assistant

## 2014-05-30 VITALS — BP 131/76 | HR 83 | Temp 98.3°F | Resp 20 | Ht 67.0 in | Wt 200.6 lb

## 2014-05-30 DIAGNOSIS — C7951 Secondary malignant neoplasm of bone: Secondary | ICD-10-CM

## 2014-05-30 DIAGNOSIS — Z5189 Encounter for other specified aftercare: Secondary | ICD-10-CM

## 2014-05-30 DIAGNOSIS — D709 Neutropenia, unspecified: Secondary | ICD-10-CM

## 2014-05-30 DIAGNOSIS — C7952 Secondary malignant neoplasm of bone marrow: Secondary | ICD-10-CM

## 2014-05-30 DIAGNOSIS — D61818 Other pancytopenia: Secondary | ICD-10-CM

## 2014-05-30 DIAGNOSIS — C159 Malignant neoplasm of esophagus, unspecified: Secondary | ICD-10-CM

## 2014-05-30 DIAGNOSIS — C787 Secondary malignant neoplasm of liver and intrahepatic bile duct: Secondary | ICD-10-CM

## 2014-05-30 DIAGNOSIS — C155 Malignant neoplasm of lower third of esophagus: Secondary | ICD-10-CM

## 2014-05-30 LAB — COMPREHENSIVE METABOLIC PANEL (CC13)
ALK PHOS: 56 U/L (ref 40–150)
ALT: 16 U/L (ref 0–55)
AST: 19 U/L (ref 5–34)
Albumin: 3 g/dL — ABNORMAL LOW (ref 3.5–5.0)
Anion Gap: 10 mEq/L (ref 3–11)
BUN: 16.7 mg/dL (ref 7.0–26.0)
CO2: 27 mEq/L (ref 22–29)
CREATININE: 0.9 mg/dL (ref 0.7–1.3)
Calcium: 8.7 mg/dL (ref 8.4–10.4)
Chloride: 102 mEq/L (ref 98–109)
Glucose: 120 mg/dl (ref 70–140)
Potassium: 3.7 mEq/L (ref 3.5–5.1)
Sodium: 139 mEq/L (ref 136–145)
Total Bilirubin: 0.72 mg/dL (ref 0.20–1.20)
Total Protein: 6.5 g/dL (ref 6.4–8.3)

## 2014-05-30 LAB — CBC WITH DIFFERENTIAL/PLATELET
BASO%: 2.1 % — AB (ref 0.0–2.0)
Basophils Absolute: 0 10*3/uL (ref 0.0–0.1)
EOS%: 1.1 % (ref 0.0–7.0)
Eosinophils Absolute: 0 10*3/uL (ref 0.0–0.5)
HEMATOCRIT: 30 % — AB (ref 38.4–49.9)
HGB: 9.4 g/dL — ABNORMAL LOW (ref 13.0–17.1)
LYMPH%: 53.2 % — AB (ref 14.0–49.0)
MCH: 24.3 pg — ABNORMAL LOW (ref 27.2–33.4)
MCHC: 31.3 g/dL — AB (ref 32.0–36.0)
MCV: 77.5 fL — ABNORMAL LOW (ref 79.3–98.0)
MONO#: 0.1 10*3/uL (ref 0.1–0.9)
MONO%: 5.3 % (ref 0.0–14.0)
NEUT#: 0.4 10*3/uL — CL (ref 1.5–6.5)
NEUT%: 38.3 % — AB (ref 39.0–75.0)
PLATELETS: 144 10*3/uL (ref 140–400)
RBC: 3.87 10*6/uL — ABNORMAL LOW (ref 4.20–5.82)
RDW: 19.6 % — ABNORMAL HIGH (ref 11.0–14.6)
WBC: 0.9 10*3/uL — CL (ref 4.0–10.3)
lymph#: 0.5 10*3/uL — ABNORMAL LOW (ref 0.9–3.3)
nRBC: 0 % (ref 0–0)

## 2014-05-30 LAB — MAGNESIUM (CC13): Magnesium: 1.8 mg/dl (ref 1.5–2.5)

## 2014-05-30 MED ORDER — PEGFILGRASTIM INJECTION 6 MG/0.6ML
6.0000 mg | Freq: Once | SUBCUTANEOUS | Status: AC
  Administered 2014-05-30: 6 mg via SUBCUTANEOUS
  Filled 2014-05-30: qty 0.6

## 2014-05-30 NOTE — Patient Instructions (Addendum)
Pegfilgrastim injection What is this medicine? PEGFILGRASTIM (peg fil GRA stim) is a long-acting granulocyte colony-stimulating factor that stimulates the growth of neutrophils, a type of white blood cell important in the body's fight against infection. It is used to reduce the incidence of fever and infection in patients with certain types of cancer who are receiving chemotherapy that affects the bone marrow. This medicine may be used for other purposes; ask your health care provider or pharmacist if you have questions. COMMON BRAND NAME(S): Neulasta What should I tell my health care provider before I take this medicine? They need to know if you have any of these conditions: -latex allergy -ongoing radiation therapy -sickle cell disease -skin reactions to acrylic adhesives (On-Body Injector only) -an unusual or allergic reaction to pegfilgrastim, filgrastim, other medicines, foods, dyes, or preservatives -pregnant or trying to get pregnant -breast-feeding How should I use this medicine? This medicine is for injection under the skin. If you get this medicine at home, you will be taught how to prepare and give the pre-filled syringe or how to use the On-body Injector. Refer to the patient Instructions for Use for detailed instructions. Use exactly as directed. Take your medicine at regular intervals. Do not take your medicine more often than directed. It is important that you put your used needles and syringes in a special sharps container. Do not put them in a trash can. If you do not have a sharps container, call your pharmacist or healthcare provider to get one. Talk to your pediatrician regarding the use of this medicine in children. Special care may be needed. Overdosage: If you think you have taken too much of this medicine contact a poison control center or emergency room at once. NOTE: This medicine is only for you. Do not share this medicine with others. What if I miss a dose? It is  important not to miss your dose. Call your doctor or health care professional if you miss your dose. If you miss a dose due to an On-body Injector failure or leakage, a new dose should be administered as soon as possible using a single prefilled syringe for manual use. What may interact with this medicine? Interactions have not been studied. Give your health care provider a list of all the medicines, herbs, non-prescription drugs, or dietary supplements you use. Also tell them if you smoke, drink alcohol, or use illegal drugs. Some items may interact with your medicine. This list may not describe all possible interactions. Give your health care provider a list of all the medicines, herbs, non-prescription drugs, or dietary supplements you use. Also tell them if you smoke, drink alcohol, or use illegal drugs. Some items may interact with your medicine. What should I watch for while using this medicine? You may need blood work done while you are taking this medicine. If you are going to need a MRI, CT scan, or other procedure, tell your doctor that you are using this medicine (On-Body Injector only). What side effects may I notice from receiving this medicine? Side effects that you should report to your doctor or health care professional as soon as possible: -allergic reactions like skin rash, itching or hives, swelling of the face, lips, or tongue -dizziness -fever -pain, redness, or irritation at site where injected -pinpoint red spots on the skin -shortness of breath or breathing problems -stomach or side pain, or pain at the shoulder -swelling -tiredness -trouble passing urine Side effects that usually do not require medical attention (report to your doctor   or health care professional if they continue or are bothersome): -bone pain -muscle pain This list may not describe all possible side effects. Call your doctor for medical advice about side effects. You may report side effects to FDA at  1-800-FDA-1088. Where should I keep my medicine? Keep out of the reach of children. Store pre-filled syringes in a refrigerator between 2 and 8 degrees C (36 and 46 degrees F). Do not freeze. Keep in carton to protect from light. Throw away this medicine if it is left out of the refrigerator for more than 48 hours. Throw away any unused medicine after the expiration date. NOTE: This sheet is a summary. It may not cover all possible information. If you have questions about this medicine, talk to your doctor, pharmacist, or health care provider.  2015, [employer]. (2013-12-16 16:14:05)  Notify us if you develop fever of 100.5 or greater. Continue weekly labs as scheduled Follow up in 2 weeks, prior to the start of your next cycle of chemotherapy

## 2014-05-30 NOTE — Progress Notes (Signed)
[place] Telephone:([phone]   Fax:([phone]) [place], MD [place] [address]  DIAGNOSIS: Metastatic esophageal adenocarcinoma initially diagnosed as Locally advanced, stage IIIB (T3, N2, M0) distal esophageal adenocarcinoma diagnosed in May of 2015  PRIOR THERAPY: Concurrent chemoradiation with weekly carboplatin for AUC of 2 and paclitaxel 45 mg/M2 for 5 weeks. Last dose was given on 03/29/1999  CURRENT THERAPY: Systemic chemotherapy with cisplatin 75 mg/M2, docetaxel 75 mg/M2 on day 1 and 5-FU 750 mg/M2 continuous infusion for 5 days. First cycle on 05/23/2014.  INTERVAL HISTORY: Jaz Mallick 68 y.o. male returns to the clinic today for a symptom management visit after his first cycle of chemotherapy with cisplatin, docetaxel and continuous infusion 5-FU. He had his pump discontinued on 05/28/2014. He did not receive Neulasta on 05/28/2014 to He had some generalized malaise related to his chemotherapy but otherwise tolerated the first cycle relatively well.  The patient denied having any significant weight loss or night sweats. He has no nausea or vomiting. He denied having any significant fever or chills. He has no chest pain or hemoptysis. He denied having any significant nausea or vomiting but continues to have mild odynophagia with no change in his bowel movement.   MEDICAL HISTORY: Past Medical History  Diagnosis Date  . GERD (gastroesophageal reflux disease)   . Hypertension   . Hyperlipemia   . 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 Barrett's esophagus 12/2013  . History of radiation therapy 02/23/14- 04/04/14    esophagus 5040 cGy 28 sessions    ALLERGIES:  has No Known Allergies.  MEDICATIONS:  Current Outpatient Prescriptions  Medication Sig Dispense Refill  . cloNIDine (CATAPRES) 0.1 MG tablet Take 0.1 mg by mouth daily as needed (will take with diovan hct if blood  pressure is greater than 130/80).       Marland Kitchen dexamethasone (DECADRON) 4 MG tablet 2 tab po bid the day before, day of and day after chemo every 3 weeks.  40 tablet  1  . dutasteride (AVODART) 0.5 MG capsule Take 0.5 mg by mouth every evening.       . Garlic 8676 MG CAPS Take 1 capsule by mouth daily.      Marland Kitchen lidocaine-prilocaine (EMLA) cream Apply 1 application topically as needed.  30 g  0  . magnesium hydroxide (MILK OF MAGNESIA) 400 MG/5ML suspension Take 5 mLs by mouth daily as needed for mild constipation.      . Omega-3 Fatty Acids (FISH OIL) 1000 MG CAPS Take 1,000 mg by mouth daily.      Marland Kitchen omeprazole (PRILOSEC) 40 MG capsule Take 40 mg by mouth daily.      . prochlorperazine (COMPAZINE) 10 MG tablet Take 1 tablet (10 mg total) by mouth every 6 (six) hours as needed for nausea or vomiting.  30 tablet  1  . simvastatin (ZOCOR) 40 MG tablet Take 40 mg by mouth daily.       . valsartan-hydrochlorothiazide (DIOVAN-HCT) 160-25 MG per tablet Take 1 tablet by mouth daily as needed (If blood pressure is greater than 130/80).       . vitamin E (VITAMIN E) 1000 UNIT capsule Take 1,000 Units by mouth daily.       No current facility-administered medications for this visit.   Facility-Administered Medications Ordered in Other Visits  Medication Dose Route Frequency Provider Last Rate Last Dose  . sodium chloride  0.9 % injection 10 mL  10 mL Intracatheter PRN Curt Bears, MD   10 mL at 05/28/14 1235    SURGICAL HISTORY:  Past Surgical History  Procedure Laterality Date  . Eus N/A 02/03/2014    Procedure: UPPER ENDOSCOPIC ULTRASOUND (EUS) LINEAR;  Surgeon: Milus Banister, MD;  Location: [facility];  Service: Endoscopy;  Laterality: N/A;    REVIEW OF SYSTEMS:  Constitutional: positive for fatigue Eyes: negative Ears, nose, mouth, throat, and face: negative Respiratory: positive for dyspnea on exertion Cardiovascular: negative Gastrointestinal: positive for  odynophagia Genitourinary:negative Integument/breast: negative Hematologic/lymphatic: negative Musculoskeletal:negative Neurological: negative Behavioral/Psych: negative Endocrine: negative Allergic/Immunologic: negative   PHYSICAL EXAMINATION: General appearance: alert, cooperative, fatigued and no distress Head: Normocephalic, without obvious abnormality, atraumatic Neck: no adenopathy, no JVD, supple, symmetrical, trachea midline and thyroid not enlarged, symmetric, no tenderness/mass/nodules Lymph nodes: Cervical, supraclavicular, and axillary nodes normal. Resp: clear to auscultation bilaterally Back: symmetric, no curvature. ROM normal. No CVA tenderness. Cardio: regular rate and rhythm, S1, S2 normal, no murmur, click, rub or gallop GI: soft, non-tender; bowel sounds normal; no masses,  no organomegaly Extremities: extremities normal, atraumatic, no cyanosis or edema and clubbing of the fingernails is noted bilaterally Neurologic: Alert and oriented X 3, normal strength and tone. Normal symmetric reflexes. Normal coordination and gait  ECOG PERFORMANCE STATUS: 1 - Symptomatic but completely ambulatory  Blood pressure 131/76, pulse 83, temperature 98.3 F (36.8 C), temperature source Oral, resp. rate 20, height 5\' 7"  (1.702 m), weight 200 lb 9.6 oz (90.992 kg).  LABORATORY DATA: Lab Results  Component Value Date   WBC 0.9* 05/30/2014   HGB 9.4* 05/30/2014   HCT 30.0* 05/30/2014   MCV 77.5* 05/30/2014   PLT 144 05/30/2014      Chemistry      Component Value Date/Time   NA 139 05/30/2014 0939   NA 138 01/26/2014 1148   K 3.7 05/30/2014 0939   K 3.9 01/26/2014 1148   CL 104 01/26/2014 1148   CO2 27 05/30/2014 0939   CO2 29 01/26/2014 1148   BUN 16.7 05/30/2014 0939   BUN 11 01/26/2014 1148   CREATININE 0.9 05/30/2014 0939   CREATININE 1.0 01/26/2014 1148      Component Value Date/Time   CALCIUM 8.7 05/30/2014 0939   CALCIUM 8.8 01/26/2014 1148   ALKPHOS 56 05/30/2014 0939    ALKPHOS 62 01/26/2014 1148   AST 19 05/30/2014 0939   AST 12 01/26/2014 1148   ALT 16 05/30/2014 0939   ALT 9 01/26/2014 1148   BILITOT 0.72 05/30/2014 0939   BILITOT 0.6 01/26/2014 1148       RADIOGRAPHIC STUDIES: Nm Pet Image Restag (ps) Skull Base To Thigh  05/16/2014   CLINICAL DATA:  Subsequent treatment strategy for esophageal cancer.  EXAM: NUCLEAR MEDICINE PET SKULL BASE TO THIGH  TECHNIQUE: 9.95 mCi F-18 FDG was injected intravenously. Full-ring PET imaging was performed from the skull base to thigh after the radiotracer. CT data was obtained and used for attenuation correction and anatomic localization.  FASTING BLOOD GLUCOSE:  Value: 103 mg/dl  COMPARISON:  02/11/2014  FINDINGS: NECK  No hypermetabolic lymph nodes in the neck.  CHEST  Dilated and patulous thoracic esophagus is again identified. Heart size appears normal. There is calcified atherosclerotic disease involving the thoracic aorta. Calcified prevascular lymph nodes are again noted. Persistent FDG uptake associated with the sub- carinal lymph node has an SUV max equal to 3.9. Previously 3.75. Interval resolution of hypermetabolic distal esophageal mass.  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 No hypermetabolic pulmonary nodule or mass identified on today's exam. There are small bilateral pleural effusions noted bilateral. New from previous exam.  ABDOMEN/PELVIS  Within the lateral segment of left hepatic lobe there is a low attenuation structure measuring 2.9 cm. This has an SUV max equal to 4.47. There has been resolution of previous hypermetabolic upper abdominal lymph nodes. Mild increased uptake associated with peripancreatic lymph node has an SUV max equal to 3.79. Previously 4.40.  SKELETON  Abnormal increased radiotracer uptake associated with the right fourth rib is again identified. On today's exam the SUV max is  equal to 5.72. Previously 2.8 cm. On the corresponding CT images there is abnormal asymmetric sclerosis within this rib compatible with metastasis.  IMPRESSION: 1. Mixed response to therapy. There has been resolution of abnormal hyper metabolism associated with the distal esophageal mass. 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 2. 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 3. Suspicious focus of increased uptake within the lateral segment of left hepatic lobe with associated the low-attenuation lesion lung corresponding CT images. This is worrisome for liver metastasis. Contrast enhanced MRI of the liver may provide further characterization of this lesion. 4. Stable, increased radiotracer uptake associated with sub carinal lymph node which in the setting of interstitial lung disease is nonspecific.   Electronically Signed   By: Kerby Moors M.D.   On: 05/16/2014 11:55   ASSESSMENT AND PLAN: This is a very pleasant 68 years old [place] American male with metastatic esophageal adenocarcinoma initially diagnosed as locally advanced disease status post a course of concurrent chemoradiation with weekly carboplatin and paclitaxel. His recent PET scan showed evidence for disease progression with new liver lesion in addition to metastatic bone lesions but there was significant improvement in the distal esophageal mass with resolution of the dysphagia. He is currently being treated with systemic chemotherapy with cisplatin, docetaxel and continuous infusion 5-FU every 3 weeks. He is status post 1 cycle. The patient was discussed with and also seen by [name]. He has been neutropenic from his recent cycle of chemotherapy with a total white count of 0.9 ANC of 0.4. He is asymptomatic and afebrile. We will give the patient a Neulasta injection today and we'll  add this to his treatment plan on day 6 of subsequent cycles. He will continue with weekly labs as scheduled. He'll return in 2 weeks prior to the start of cycle #2.  The patient voices understanding of current disease status and treatment options and is in agreement with the current care plan.  All questions were answered. The patient knows to call the clinic with any problems, questions or concerns. We can certainly see the patient much sooner if necessary.  Carlton Adam PA-C  ADDENDUM: Hematology/Oncology Attending:  I had a face to face encounter with the patient today. I recommended his care plan. This is a very pleasant 67 years old African American male with metastatic esophageal adenocarcinoma status post course of concurrent chemoradiation with weekly carboplatin and paclitaxel and he is currently undergoing systemic chemotherapy with cisplatin, docetaxel and continuous infusion 5-FU status post one cycle started last week. He tolerated the first cycle of his treatment fairly well except for increasing fatigue. He denied having any  fever or chills. His CBC today showed pancytopenia and neutropenia. Will arrange for the patient to receive Neulasta injection today. He is currently afebrile. He would come back for followup visit in 2 weeks with the start of cycle #2. He was advised to call immediately if he has any concerning symptoms in the interval.  Disclaimer: This note was dictated with voice recognition software. Similar sounding words can inadvertently be transcribed and may not be corrected upon review. Eilleen Kempf., MD 05/31/2014

## 2014-06-07 ENCOUNTER — Other Ambulatory Visit (HOSPITAL_BASED_OUTPATIENT_CLINIC_OR_DEPARTMENT_OTHER): Payer: Medicare Other

## 2014-06-07 DIAGNOSIS — C159 Malignant neoplasm of esophagus, unspecified: Secondary | ICD-10-CM

## 2014-06-07 DIAGNOSIS — C155 Malignant neoplasm of lower third of esophagus: Secondary | ICD-10-CM

## 2014-06-07 DIAGNOSIS — C787 Secondary malignant neoplasm of liver and intrahepatic bile duct: Secondary | ICD-10-CM

## 2014-06-07 LAB — CBC WITH DIFFERENTIAL/PLATELET
BASO%: 0.3 % (ref 0.0–2.0)
Basophils Absolute: 0 10*3/uL (ref 0.0–0.1)
EOS ABS: 0 10*3/uL (ref 0.0–0.5)
EOS%: 0.2 % (ref 0.0–7.0)
HEMATOCRIT: 30.8 % — AB (ref 38.4–49.9)
HGB: 9.4 g/dL — ABNORMAL LOW (ref 13.0–17.1)
LYMPH%: 7.9 % — AB (ref 14.0–49.0)
MCH: 24 pg — ABNORMAL LOW (ref 27.2–33.4)
MCHC: 30.5 g/dL — ABNORMAL LOW (ref 32.0–36.0)
MCV: 78.4 fL — ABNORMAL LOW (ref 79.3–98.0)
MONO#: 1.4 10*3/uL — ABNORMAL HIGH (ref 0.1–0.9)
MONO%: 9.5 % (ref 0.0–14.0)
NEUT#: 11.8 10*3/uL — ABNORMAL HIGH (ref 1.5–6.5)
NEUT%: 82.1 % — AB (ref 39.0–75.0)
PLATELETS: 122 10*3/uL — AB (ref 140–400)
RBC: 3.92 10*6/uL — AB (ref 4.20–5.82)
RDW: 23.1 % — ABNORMAL HIGH (ref 11.0–14.6)
WBC: 14.3 10*3/uL — AB (ref 4.0–10.3)
lymph#: 1.1 10*3/uL (ref 0.9–3.3)

## 2014-06-07 LAB — COMPREHENSIVE METABOLIC PANEL (CC13)
ALT: 7 U/L (ref 0–55)
AST: 16 U/L (ref 5–34)
Albumin: 2.9 g/dL — ABNORMAL LOW (ref 3.5–5.0)
Alkaline Phosphatase: 98 U/L (ref 40–150)
Anion Gap: 6 mEq/L (ref 3–11)
BILIRUBIN TOTAL: 0.42 mg/dL (ref 0.20–1.20)
BUN: 12.9 mg/dL (ref 7.0–26.0)
CO2: 27 mEq/L (ref 22–29)
Calcium: 8.4 mg/dL (ref 8.4–10.4)
Chloride: 107 mEq/L (ref 98–109)
Creatinine: 0.9 mg/dL (ref 0.7–1.3)
Glucose: 94 mg/dl (ref 70–140)
Potassium: 3.5 mEq/L (ref 3.5–5.1)
SODIUM: 140 meq/L (ref 136–145)
Total Protein: 6.1 g/dL — ABNORMAL LOW (ref 6.4–8.3)

## 2014-06-07 LAB — MAGNESIUM (CC13): MAGNESIUM: 1.6 mg/dL (ref 1.5–2.5)

## 2014-06-12 ENCOUNTER — Telehealth: Payer: Self-pay | Admitting: Internal Medicine

## 2014-06-12 DIAGNOSIS — R0600 Dyspnea, unspecified: Secondary | ICD-10-CM

## 2014-06-12 NOTE — Telephone Encounter (Signed)
ONO for [name] [name] [address] [place] [address] on 05/30/14 shows piulse ox < 88% at 1h 44 min. So, start 2L [place] at night  Thanks  Dr. Brand Males, M.D., [facility].C.P Pulmonary and Critical Care Medicine Staff Physician [place] [facility] Pager: [phone], If no answer or between  15:00h - 7:00h: call [phone]  06/12/2014 9:18 PM

## 2014-06-13 ENCOUNTER — Ambulatory Visit (HOSPITAL_BASED_OUTPATIENT_CLINIC_OR_DEPARTMENT_OTHER): Payer: Medicare Other | Admitting: Nurse Practitioner

## 2014-06-13 ENCOUNTER — Encounter: Payer: Self-pay | Admitting: Nurse Practitioner

## 2014-06-13 ENCOUNTER — Encounter (HOSPITAL_BASED_OUTPATIENT_CLINIC_OR_DEPARTMENT_OTHER): Payer: Medicare Other

## 2014-06-13 ENCOUNTER — Ambulatory Visit: Payer: Medicare Other

## 2014-06-13 ENCOUNTER — Other Ambulatory Visit: Payer: Self-pay | Admitting: Internal Medicine

## 2014-06-13 ENCOUNTER — Other Ambulatory Visit: Payer: Medicare Other

## 2014-06-13 VITALS — BP 153/83 | HR 94 | Temp 98.8°F | Resp 18

## 2014-06-13 DIAGNOSIS — Z5111 Encounter for antineoplastic chemotherapy: Secondary | ICD-10-CM

## 2014-06-13 DIAGNOSIS — K1239 Other oral mucositis (ulcerative): Secondary | ICD-10-CM

## 2014-06-13 DIAGNOSIS — R530 Neoplastic (malignant) related fatigue: Secondary | ICD-10-CM | POA: Insufficient documentation

## 2014-06-13 DIAGNOSIS — C787 Secondary malignant neoplasm of liver and intrahepatic bile duct: Secondary | ICD-10-CM

## 2014-06-13 DIAGNOSIS — C159 Malignant neoplasm of esophagus, unspecified: Secondary | ICD-10-CM

## 2014-06-13 DIAGNOSIS — C155 Malignant neoplasm of lower third of esophagus: Secondary | ICD-10-CM

## 2014-06-13 DIAGNOSIS — K1231 Oral mucositis (ulcerative) due to antineoplastic therapy: Secondary | ICD-10-CM | POA: Insufficient documentation

## 2014-06-13 DIAGNOSIS — K121 Other forms of stomatitis: Secondary | ICD-10-CM

## 2014-06-13 DIAGNOSIS — G893 Neoplasm related pain (acute) (chronic): Secondary | ICD-10-CM

## 2014-06-13 DIAGNOSIS — R5383 Other fatigue: Secondary | ICD-10-CM

## 2014-06-13 DIAGNOSIS — R5381 Other malaise: Secondary | ICD-10-CM

## 2014-06-13 DIAGNOSIS — E8809 Other disorders of plasma-protein metabolism, not elsewhere classified: Secondary | ICD-10-CM

## 2014-06-13 LAB — CBC WITH DIFFERENTIAL/PLATELET
BASO%: 0.2 % (ref 0.0–2.0)
Basophils Absolute: 0 10*3/uL (ref 0.0–0.1)
EOS%: 0 % (ref 0.0–7.0)
Eosinophils Absolute: 0 10*3/uL (ref 0.0–0.5)
HCT: 31.9 % — ABNORMAL LOW (ref 38.4–49.9)
HEMOGLOBIN: 10 g/dL — AB (ref 13.0–17.1)
LYMPH#: 0.8 10*3/uL — AB (ref 0.9–3.3)
LYMPH%: 5.4 % — ABNORMAL LOW (ref 14.0–49.0)
MCH: 25.1 pg — ABNORMAL LOW (ref 27.2–33.4)
MCHC: 31.4 g/dL — AB (ref 32.0–36.0)
MCV: 79.8 fL (ref 79.3–98.0)
MONO#: 0.7 10*3/uL (ref 0.1–0.9)
MONO%: 4.9 % (ref 0.0–14.0)
NEUT#: 12.4 10*3/uL — ABNORMAL HIGH (ref 1.5–6.5)
NEUT%: 89.5 % — ABNORMAL HIGH (ref 39.0–75.0)
Platelets: 291 10*3/uL (ref 140–400)
RBC: 4 10*6/uL — AB (ref 4.20–5.82)
RDW: 24.8 % — ABNORMAL HIGH (ref 11.0–14.6)
WBC: 13.9 10*3/uL — ABNORMAL HIGH (ref 4.0–10.3)

## 2014-06-13 LAB — COMPREHENSIVE METABOLIC PANEL (CC13)
ALBUMIN: 3 g/dL — AB (ref 3.5–5.0)
ALK PHOS: 95 U/L (ref 40–150)
ALT: 9 U/L (ref 0–55)
AST: 12 U/L (ref 5–34)
Anion Gap: 11 mEq/L (ref 3–11)
BUN: 14.5 mg/dL (ref 7.0–26.0)
CHLORIDE: 107 meq/L (ref 98–109)
CO2: 24 mEq/L (ref 22–29)
Calcium: 9.1 mg/dL (ref 8.4–10.4)
Creatinine: 0.8 mg/dL (ref 0.7–1.3)
Glucose: 174 mg/dl — ABNORMAL HIGH (ref 70–140)
POTASSIUM: 3.9 meq/L (ref 3.5–5.1)
SODIUM: 141 meq/L (ref 136–145)
Total Bilirubin: 0.53 mg/dL (ref 0.20–1.20)
Total Protein: 7.1 g/dL (ref 6.4–8.3)

## 2014-06-13 LAB — MAGNESIUM (CC13): Magnesium: 1.9 mg/dl (ref 1.5–2.5)

## 2014-06-13 MED ORDER — CISPLATIN CHEMO INJECTION 100MG/100ML
75.0000 mg/m2 | Freq: Once | INTRAVENOUS | Status: AC
  Administered 2014-06-13: 157 mg via INTRAVENOUS
  Filled 2014-06-13: qty 157

## 2014-06-13 MED ORDER — SODIUM CHLORIDE 0.9 % IV SOLN
150.0000 mg | Freq: Once | INTRAVENOUS | Status: AC
  Administered 2014-06-13: 150 mg via INTRAVENOUS
  Filled 2014-06-13: qty 5

## 2014-06-13 MED ORDER — PALONOSETRON HCL INJECTION 0.25 MG/5ML
INTRAVENOUS | Status: AC
  Filled 2014-06-13: qty 5

## 2014-06-13 MED ORDER — SODIUM CHLORIDE 0.9 % IV SOLN
Freq: Once | INTRAVENOUS | Status: AC
  Administered 2014-06-13: 09:00:00 via INTRAVENOUS

## 2014-06-13 MED ORDER — DEXAMETHASONE 4 MG PO TABS: ORAL_TABLET | ORAL | Status: DC

## 2014-06-13 MED ORDER — SODIUM CHLORIDE 0.9 % IJ SOLN
10.0000 mL | INTRAMUSCULAR | Status: DC | PRN
  Filled 2014-06-13: qty 10

## 2014-06-13 MED ORDER — DOCETAXEL CHEMO INJECTION 160 MG/16ML
75.0000 mg/m2 | Freq: Once | INTRAVENOUS | Status: AC
  Administered 2014-06-13: 160 mg via INTRAVENOUS
  Filled 2014-06-13: qty 16

## 2014-06-13 MED ORDER — PALONOSETRON HCL INJECTION 0.25 MG/5ML
0.2500 mg | Freq: Once | INTRAVENOUS | Status: AC
  Administered 2014-06-13: 0.25 mg via INTRAVENOUS

## 2014-06-13 MED ORDER — DEXAMETHASONE SODIUM PHOSPHATE 20 MG/5ML IJ SOLN
INTRAMUSCULAR | Status: AC
  Filled 2014-06-13: qty 5

## 2014-06-13 MED ORDER — POTASSIUM CHLORIDE 2 MEQ/ML IV SOLN
Freq: Once | INTRAVENOUS | Status: AC
  Administered 2014-06-13: 11:00:00 via INTRAVENOUS
  Filled 2014-06-13: qty 10

## 2014-06-13 MED ORDER — DEXAMETHASONE SODIUM PHOSPHATE 20 MG/5ML IJ SOLN
12.0000 mg | Freq: Once | INTRAMUSCULAR | Status: AC
  Administered 2014-06-13: 12 mg via INTRAVENOUS

## 2014-06-13 MED ORDER — HEPARIN SOD (PORK) LOCK FLUSH 100 UNIT/ML IV SOLN
500.0000 [IU] | Freq: Once | INTRAVENOUS | Status: DC | PRN
  Filled 2014-06-13: qty 5

## 2014-06-13 MED ORDER — SODIUM CHLORIDE 0.9 % IV SOLN
750.0000 mg/m2/d | INTRAVENOUS | Status: DC
  Administered 2014-06-13: 7850 mg via INTRAVENOUS
  Filled 2014-06-13: qty 157

## 2014-06-13 NOTE — Progress Notes (Signed)
[place]   Chief Complaint  Patient presents with  . Follow-up    HPI: [name] [name] 68 y.o. male diagnosed with esophageal cancer.  Currently undergoing cisplatin/docetaxel/5-FU continuous infusion chemotherapy regimen.  Presents today for cycle 2 of the same regimen.  Patient stated he is tolerating his chemotherapy fairly well.  He is complaining of some mild, chronic fatigue; but feels it is stable at present.  He reports that his appetite is actually improved recently.  He also reports that his previous mucositis is slowly improving as well.  He continues to use biotin mouth rinse on a regular basis.  Patient denies any recent fevers or chills.  Patient is also been suffering with chronic low back and right hip discomfort for quite some time.  He uses a cane for ambulation.  Patient states he has been to an orthopedist in the past; and prefers to hold any further orthopedic treatment for his chronic back pain until his chemotherapy is completed.  Patient is also requesting a refill of his dexamethasone today.   HPI  CURRENT THERAPY: Upcoming Treatment Dates - GE JUNCTION Docetaxel / Cisplatin / 5FU IVCI q21d Days with orders from any treatment category:  06/18/2014      SCHEDULING COMMUNICATION      pegfilgrastim (NEULASTA) injection 6 mg      sodium chloride 0.9 % injection 10 mL      heparin lock flush 100 unit/mL      heparin lock flush 100 unit/mL      alteplase (CATHFLO ACTIVASE) injection 2 mg      sodium chloride 0.9 % injection 3 mL      Cold Pack 1 packet 07/04/2014      SCHEDULING COMMUNICATION      palonosetron (ALOXI) injection 0.25 mg      Dexamethasone Sodium Phosphate (DECADRON) injection 12 mg      fosaprepitant (EMEND) 150 mg in sodium chloride 0.9 % 145 mL IVPB      DOCEtaxel (TAXOTERE) 160 mg in dextrose 5 % 250 mL chemo infusion      CISplatin (PLATINOL) 157 mg in sodium chloride 0.9 % 500 mL chemo infusion      fluorouracil (ADRUCIL)  7,850 mg in sodium chloride 0.9 % 150 mL chemo infusion      sodium chloride 0.9 % injection 10 mL      heparin lock flush 100 unit/mL      heparin lock flush 100 unit/mL      alteplase (CATHFLO ACTIVASE) injection 2 mg      sodium chloride 0.9 % injection 3 mL      Cold Pack 1 packet      diphenhydrAMINE (BENADRYL) injection 25 mg      famotidine (PEPCID) IVPB 20 mg      0.9 %  sodium chloride infusion      methylPREDNISolone sodium succinate (SOLU-MEDROL) 125 mg/2 mL injection 125 mg      EPINEPHrine (ADRENALIN) 0.1 MG/ML injection 0.25 mg      EPINEPHrine (ADRENALIN) 0.1 MG/ML injection 0.25 mg      EPINEPHrine (ADRENALIN) injection 0.5 mg      EPINEPHrine (ADRENALIN) injection 0.5 mg      diphenhydrAMINE (BENADRYL) injection 50 mg      albuterol (PROVENTIL) (2.5 MG/3ML) 0.083% nebulizer solution 2.5 mg      0.9 %  sodium chloride infusion      dextrose 5 % and 0.45% NaCl 1,000 mL with potassium chloride 20 mEq, magnesium  sulfate 12 mEq, mannitol 12.5 g infusion      TREATMENT CONDITIONS 07/09/2014      pegfilgrastim (NEULASTA) injection 6 mg      SCHEDULING COMMUNICATION      sodium chloride 0.9 % injection 10 mL      heparin lock flush 100 unit/mL      heparin lock flush 100 unit/mL      alteplase (CATHFLO ACTIVASE) injection 2 mg      sodium chloride 0.9 % injection 3 mL      Cold Pack 1 packet    ROS  Past Medical History  Diagnosis Date  . GERD (gastroesophageal reflux disease)   . Hypertension   . Hyperlipemia   . 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 Barrett's esophagus 12/2013  . History of radiation therapy 02/23/14- 04/04/14    esophagus 5040 cGy 28 sessions    Past Surgical History  Procedure Laterality Date  . Eus N/A 02/03/2014    Procedure: UPPER ENDOSCOPIC ULTRASOUND (EUS) LINEAR;  Surgeon: Milus Banister, MD;  Location: [facility];  Service: Endoscopy;  Laterality: N/A;    has Dysphagia, unspecified(787.20); Essential hypertension, benign;  Esophageal carcinoma; Malignant neoplasm of lower third of esophagus; ILD (interstitial lung disease); Esophageal cancer; Neoplastic malignant related fatigue; Mucositis (ulcerative) due to antineoplastic therapy; Neoplasm related pain (acute) (chronic); and Hypoalbuminemia on his problem list.     has No Known Allergies.    Medication List       This list is accurate as of: 06/13/14  2:42 PM.  Always use your most recent med list.               cloNIDine 0.1 MG tablet  Commonly known as:  CATAPRES  Take 0.1 mg by mouth daily as needed (will take with diovan hct if blood pressure is greater than 130/80).     dexamethasone 4 MG tablet  Commonly known as:  DECADRON  2 tab po bid the day before, day of and day after chemo every 3 weeks.     dutasteride 0.5 MG capsule  Commonly known as:  AVODART  Take 0.5 mg by mouth every evening.     Fish Oil 1000 MG Caps  Take 1,000 mg by mouth daily.     Garlic 5784 MG Caps  Take 1 capsule by mouth daily.     lidocaine-prilocaine cream  Commonly known as:  EMLA  Apply 1 application topically as needed.     magnesium hydroxide 400 MG/5ML suspension  Commonly known as:  MILK OF MAGNESIA  Take 5 mLs by mouth daily as needed for mild constipation.     omeprazole 40 MG capsule  Commonly known as:  PRILOSEC  Take 40 mg by mouth daily.     prochlorperazine 10 MG tablet  Commonly known as:  COMPAZINE  Take 1 tablet (10 mg total) by mouth every 6 (six) hours as needed for nausea or vomiting.     simvastatin 40 MG tablet  Commonly known as:  ZOCOR  Take 40 mg by mouth daily.     valsartan-hydrochlorothiazide 160-25 MG per tablet  Commonly known as:  DIOVAN-HCT  Take 1 tablet by mouth daily as needed (If blood pressure is greater than 130/80).     vitamin E 1000 UNIT capsule  Generic drug:  vitamin E  Take 1,000 Units by mouth daily.         PHYSICAL EXAMINATION  Vitals :  BP 153/83, HR 94, temp 98.8  Physical Exam  Nursing  note and vitals reviewed. Constitutional: He is oriented to person, place, and time and well-developed, well-nourished, and in no distress. No distress.  HENT:  Head: Normocephalic and atraumatic.  Mouth/Throat: No oropharyngeal exudate.  Mild mucositis noted; with healing lesions to the left side of his tongue.  Eyes: Conjunctivae are normal. Pupils are equal, round, and reactive to light. No scleral icterus.  Neck: Normal range of motion. Neck supple. No thyromegaly present.  Cardiovascular: Normal rate, regular rhythm, normal heart sounds and intact distal pulses.  Exam reveals no friction rub.   No murmur heard. Pulmonary/Chest: Effort normal and breath sounds normal. No respiratory distress.  Abdominal: Soft. Bowel sounds are normal. He exhibits no distension. There is no tenderness. There is no rebound.  Musculoskeletal: Normal range of motion. He exhibits no edema and no tenderness.  Lymphadenopathy:    He has no cervical adenopathy.  Neurological: He is alert and oriented to person, place, and time.  Patient uses a cane for ambulation as his baseline.  Skin: Skin is warm and dry. No rash noted. No erythema.  Psychiatric: Affect normal.    LABORATORY DATA:. CBC  Lab Results  Component Value Date   WBC 13.9* 06/13/2014   RBC 4.00* 06/13/2014   HGB 10.0* 06/13/2014   HCT 31.9* 06/13/2014   PLT 291 06/13/2014   MCV 79.8 06/13/2014   MCH 25.1* 06/13/2014   MCHC 31.4* 06/13/2014   RDW 24.8* 06/13/2014   LYMPHSABS 0.8* 06/13/2014   MONOABS 0.7 06/13/2014   EOSABS 0.0 06/13/2014   BASOSABS 0.0 06/13/2014     CMET  Lab Results  Component Value Date   NA 141 06/13/2014   K 3.9 06/13/2014   CL 104 01/26/2014   CO2 24 06/13/2014   GLUCOSE 174* 06/13/2014   BUN 14.5 06/13/2014   CREATININE 0.8 06/13/2014   CALCIUM 9.1 06/13/2014   PROT 7.1 06/13/2014   ALBUMIN 3.0* 06/13/2014   AST 12 06/13/2014   ALT 9 06/13/2014   ALKPHOS 95 06/13/2014   BILITOT 0.53 06/13/2014    ASSESSMENT/PLAN:     Esophageal cancer  Assessment & Plan Patient appears to be tolerating chemotherapy fairly well.  All previous chemotherapy-induced pancytopenia has essentially resolved.  Patient will proceed today with cycle 2 of his cisplatin/docetaxel/5-few continuous infusion chemotherapy as previously directed.  He will receive Neulasta injection on day 6 of chemotherapy regimen as previously directed for growth factor support.  Patient will continue to return weekly for labs.  Patient will need labs, office visit, and  cycle 3 of his chemotherapy in approximately 3 weeks from today.     Neoplastic malignant related fatigue  Assessment & Plan Chronic fatigue is most likely secondary to the cancer diagnosis and chemotherapy.  Patient was encouraged to remain as active as possible.   Mucositis (ulcerative) due to antineoplastic therapy  Assessment & Plan Chemotherapy-induced mucositis does appear to be slowly resolving.  The patient has been using biotin mouth rinse.  He was also encouraged to try the baking soda/salt swish and spit as well.   Neoplasm related pain (acute) (chronic)  Assessment & Plan Patient has a chronic low back and right hip pain; and uses a cane to assist with ambulation.  Patient states he has been referred to an orthopedist in the past; but all treatment for back pain is currently on hold due to to his chemotherapy regimen.  May want to consider a physical therapy referral in the future to increase strength.  Hypoalbuminemia  Assessment & Plan The patient's protein levels remain low.  Patient was encouraged to push protein in his diet.  He states that he has been trying to drink boost at least one or twice per day.   Patient stated understanding of all instructions; and was in agreement with this plan of care. The patient knows to call the clinic with any problems, questions or concerns.   Review/collaboration with [name] regarding all aspects of patient's visit  today.   Total time spent with patient was 25 minutes;  with greater than 75 percent of that time spent in face to face counseling regarding his symptoms, review of his lab results, and coordination of care and follow up.  Disclaimer: This note was dictated with voice recognition software. Similar sounding words can inadvertently be transcribed and may not be corrected upon review.   Drue Second, NP 06/13/2014

## 2014-06-13 NOTE — Assessment & Plan Note (Signed)
Chemotherapy-induced mucositis does appear to be slowly resolving.  The patient has been using biotin mouth rinse.  He was also encouraged to try the baking soda/salt swish and spit as well.

## 2014-06-13 NOTE — Assessment & Plan Note (Signed)
Patient appears to be tolerating chemotherapy fairly well.  All previous chemotherapy-induced pancytopenia has essentially resolved.  Patient will proceed today with cycle 2 of his cisplatin/docetaxel/5-few continuous infusion chemotherapy as previously directed.  He will receive Neulasta injection on day 6 of chemotherapy regimen as previously directed for growth factor support.  Patient will continue to return weekly for labs.  Patient will need labs, office visit, and  cycle 3 of his chemotherapy in approximately 3 weeks from today.

## 2014-06-13 NOTE — Patient Instructions (Signed)
[place] Discharge Instructions for Patients Receiving Chemotherapy  Today you received the following chemotherapy agents Taxotere, Cisplatin, Flurourocil  To help prevent nausea and vomiting after your treatment, we encourage you to take your nausea medication Zofran by mouth as directed   If you develop nausea and vomiting that is not controlled by your nausea medication, call the clinic.   BELOW ARE SYMPTOMS THAT SHOULD BE REPORTED IMMEDIATELY:  *FEVER GREATER THAN 100.5 F  *CHILLS WITH OR WITHOUT FEVER  NAUSEA AND VOMITING THAT IS NOT CONTROLLED WITH YOUR NAUSEA MEDICATION  *UNUSUAL SHORTNESS OF BREATH  *UNUSUAL BRUISING OR BLEEDING  TENDERNESS IN MOUTH AND THROAT WITH OR WITHOUT PRESENCE OF ULCERS  *URINARY PROBLEMS  *BOWEL PROBLEMS  UNUSUAL RASH Items with * indicate a potential emergency and should be followed up as soon as possible.  Feel free to call the clinic you have any questions or concerns. The clinic phone number is [phone].

## 2014-06-13 NOTE — Assessment & Plan Note (Signed)
The patient's protein levels remain low.  Patient was encouraged to push protein in his diet.  He states that he has been trying to drink boost at least one or twice per day.

## 2014-06-13 NOTE — Assessment & Plan Note (Signed)
Patient has a chronic low back and right hip pain; and uses a cane to assist with ambulation.  Patient states he has been referred to an orthopedist in the past; but all treatment for back pain is currently on hold due to to his chemotherapy regimen.  May want to consider a physical therapy referral in the future to increase strength.

## 2014-06-13 NOTE — Assessment & Plan Note (Signed)
Chronic fatigue is most likely secondary to the cancer diagnosis and chemotherapy.  Patient was encouraged to remain as active as possible.

## 2014-06-14 NOTE — Progress Notes (Signed)
This encounter was created in error - please disregard.

## 2014-06-14 NOTE — Telephone Encounter (Signed)
lmtcb

## 2014-06-15 ENCOUNTER — Telehealth: Payer: Self-pay | Admitting: Dietician

## 2014-06-15 NOTE — Telephone Encounter (Signed)
Brief Outpatient Oncology Nutrition Note  Patient has been identified to be at risk on malnutrition screen.  Wt Readings from Last 10 Encounters:  05/30/14 200 lb 9.6 oz (90.992 kg)  05/18/14 203 lb 8 oz (92.307 kg)  05/17/14 202 lb (91.627 kg)  05/03/14 200 lb 14.4 oz (91.128 kg)  04/04/14 199 lb (90.266 kg)  04/04/14 197 lb 8 oz (89.585 kg)  03/29/14 207 lb 12.8 oz (94.257 kg)  03/21/14 205 lb 3.2 oz (93.078 kg)  03/21/14 204 lb 1.6 oz (92.579 kg)  03/14/14 205 lb (92.987 kg)     DIAGNOSIS: Metastatic esophageal adenocarcinoma initially diagnosed as Locally advanced, stage IIIB (T3, N2, M0) distal esophageal adenocarcinoma diagnosed in May of 2015   Called patient due to weight loss.  Patient has been followed by the [facility].  Patient is not available at this time with no answering service.  Antonieta Iba, RD, LDN

## 2014-06-17 ENCOUNTER — Other Ambulatory Visit: Payer: Self-pay | Admitting: Internal Medicine

## 2014-06-18 ENCOUNTER — Ambulatory Visit (HOSPITAL_BASED_OUTPATIENT_CLINIC_OR_DEPARTMENT_OTHER): Payer: Medicare Other

## 2014-06-18 VITALS — BP 118/72 | HR 102 | Temp 98.4°F | Resp 20

## 2014-06-18 DIAGNOSIS — Z5189 Encounter for other specified aftercare: Secondary | ICD-10-CM

## 2014-06-18 DIAGNOSIS — C155 Malignant neoplasm of lower third of esophagus: Secondary | ICD-10-CM

## 2014-06-18 MED ORDER — PEGFILGRASTIM INJECTION 6 MG/0.6ML
6.0000 mg | Freq: Once | SUBCUTANEOUS | Status: AC
  Administered 2014-06-18: 6 mg via SUBCUTANEOUS

## 2014-06-18 MED ORDER — SODIUM CHLORIDE 0.9 % IJ SOLN
10.0000 mL | INTRAMUSCULAR | Status: DC | PRN
  Administered 2014-06-18: 10 mL
  Filled 2014-06-18: qty 10

## 2014-06-18 MED ORDER — HEPARIN SOD (PORK) LOCK FLUSH 100 UNIT/ML IV SOLN
500.0000 [IU] | Freq: Once | INTRAVENOUS | Status: AC | PRN
  Administered 2014-06-18: 500 [IU]
  Filled 2014-06-18: qty 5

## 2014-06-18 NOTE — Progress Notes (Signed)
Pt in for pump d/c.  6.8 ml remained before infusion complete.  RN primed the remainder of the volume in.  Pump then d/c'd.  Pt stable at discharge.

## 2014-06-18 NOTE — Patient Instructions (Signed)
Pegfilgrastim injection What is this medicine? PEGFILGRASTIM (peg fil GRA stim) is a long-acting granulocyte colony-stimulating factor that stimulates the growth of neutrophils, a type of white blood cell important in the body's fight against infection. It is used to reduce the incidence of fever and infection in patients with certain types of cancer who are receiving chemotherapy that affects the bone marrow. This medicine may be used for other purposes; ask your health care provider or pharmacist if you have questions. COMMON BRAND NAME(S): Neulasta What should I tell my health care provider before I take this medicine? They need to know if you have any of these conditions: -latex allergy -ongoing radiation therapy -sickle cell disease -skin reactions to acrylic adhesives (On-Body Injector only) -an unusual or allergic reaction to pegfilgrastim, filgrastim, other medicines, foods, dyes, or preservatives -pregnant or trying to get pregnant -breast-feeding How should I use this medicine? This medicine is for injection under the skin. If you get this medicine at home, you will be taught how to prepare and give the pre-filled syringe or how to use the On-body Injector. Refer to the patient Instructions for Use for detailed instructions. Use exactly as directed. Take your medicine at regular intervals. Do not take your medicine more often than directed. It is important that you put your used needles and syringes in a special sharps container. Do not put them in a trash can. If you do not have a sharps container, call your pharmacist or healthcare provider to get one. Talk to your pediatrician regarding the use of this medicine in children. Special care may be needed. Overdosage: If you think you have taken too much of this medicine contact a poison control center or emergency room at once. NOTE: This medicine is only for you. Do not share this medicine with others. What if I miss a dose? It is  important not to miss your dose. Call your doctor or health care professional if you miss your dose. If you miss a dose due to an On-body Injector failure or leakage, a new dose should be administered as soon as possible using a single prefilled syringe for manual use. What may interact with this medicine? Interactions have not been studied. Give your health care provider a list of all the medicines, herbs, non-prescription drugs, or dietary supplements you use. Also tell them if you smoke, drink alcohol, or use illegal drugs. Some items may interact with your medicine. This list may not describe all possible interactions. Give your health care provider a list of all the medicines, herbs, non-prescription drugs, or dietary supplements you use. Also tell them if you smoke, drink alcohol, or use illegal drugs. Some items may interact with your medicine. What should I watch for while using this medicine? You may need blood work done while you are taking this medicine. If you are going to need a MRI, CT scan, or other procedure, tell your doctor that you are using this medicine (On-Body Injector only). What side effects may I notice from receiving this medicine? Side effects that you should report to your doctor or health care professional as soon as possible: -allergic reactions like skin rash, itching or hives, swelling of the face, lips, or tongue -dizziness -fever -pain, redness, or irritation at site where injected -pinpoint red spots on the skin -shortness of breath or breathing problems -stomach or side pain, or pain at the shoulder -swelling -tiredness -trouble passing urine Side effects that usually do not require medical attention (report to your doctor   or health care professional if they continue or are bothersome): -bone pain -muscle pain This list may not describe all possible side effects. Call your doctor for medical advice about side effects. You may report side effects to FDA at  1-800-FDA-1088. Where should I keep my medicine? Keep out of the reach of children. Store pre-filled syringes in a refrigerator between 2 and 8 degrees C (36 and 46 degrees F). Do not freeze. Keep in carton to protect from light. Throw away this medicine if it is left out of the refrigerator for more than 48 hours. Throw away any unused medicine after the expiration date. NOTE: This sheet is a summary. It may not cover all possible information. If you have questions about this medicine, talk to your doctor, pharmacist, or health care provider.  2015, Elsevier/Gold Standard. (2013-12-16 16:14:05)  

## 2014-06-20 ENCOUNTER — Other Ambulatory Visit (HOSPITAL_BASED_OUTPATIENT_CLINIC_OR_DEPARTMENT_OTHER): Payer: Medicare Other

## 2014-06-20 ENCOUNTER — Telehealth: Payer: Self-pay | Admitting: *Deleted

## 2014-06-20 ENCOUNTER — Telehealth: Payer: Self-pay | Admitting: Internal Medicine

## 2014-06-20 DIAGNOSIS — C787 Secondary malignant neoplasm of liver and intrahepatic bile duct: Secondary | ICD-10-CM

## 2014-06-20 DIAGNOSIS — C155 Malignant neoplasm of lower third of esophagus: Secondary | ICD-10-CM

## 2014-06-20 DIAGNOSIS — C159 Malignant neoplasm of esophagus, unspecified: Secondary | ICD-10-CM

## 2014-06-20 LAB — COMPREHENSIVE METABOLIC PANEL (CC13)
ALBUMIN: 2.8 g/dL — AB (ref 3.5–5.0)
ALT: 11 U/L (ref 0–55)
AST: 13 U/L (ref 5–34)
Alkaline Phosphatase: 66 U/L (ref 40–150)
Anion Gap: 11 mEq/L (ref 3–11)
BUN: 17.8 mg/dL (ref 7.0–26.0)
CO2: 26 mEq/L (ref 22–29)
Calcium: 8.6 mg/dL (ref 8.4–10.4)
Chloride: 103 mEq/L (ref 98–109)
Creatinine: 0.8 mg/dL (ref 0.7–1.3)
GLUCOSE: 147 mg/dL — AB (ref 70–140)
POTASSIUM: 3.7 meq/L (ref 3.5–5.1)
Sodium: 140 mEq/L (ref 136–145)
Total Bilirubin: 0.8 mg/dL (ref 0.20–1.20)
Total Protein: 6.1 g/dL — ABNORMAL LOW (ref 6.4–8.3)

## 2014-06-20 LAB — CBC WITH DIFFERENTIAL/PLATELET
BASO%: 1.9 % (ref 0.0–2.0)
Basophils Absolute: 0 10*3/uL (ref 0.0–0.1)
EOS ABS: 0 10*3/uL (ref 0.0–0.5)
EOS%: 2.9 % (ref 0.0–7.0)
HEMATOCRIT: 30 % — AB (ref 38.4–49.9)
HGB: 9.2 g/dL — ABNORMAL LOW (ref 13.0–17.1)
LYMPH%: 38.5 % (ref 14.0–49.0)
MCH: 24.7 pg — ABNORMAL LOW (ref 27.2–33.4)
MCHC: 30.7 g/dL — ABNORMAL LOW (ref 32.0–36.0)
MCV: 80.4 fL (ref 79.3–98.0)
MONO#: 0.1 10*3/uL (ref 0.1–0.9)
MONO%: 7.7 % (ref 0.0–14.0)
NEUT%: 49 % (ref 39.0–75.0)
NEUTROS ABS: 0.5 10*3/uL — AB (ref 1.5–6.5)
PLATELETS: 129 10*3/uL — AB (ref 140–400)
RBC: 3.73 10*6/uL — ABNORMAL LOW (ref 4.20–5.82)
RDW: 21.7 % — AB (ref 11.0–14.6)
WBC: 1 10*3/uL — ABNORMAL LOW (ref 4.0–10.3)
lymph#: 0.4 10*3/uL — ABNORMAL LOW (ref 0.9–3.3)

## 2014-06-20 LAB — MAGNESIUM (CC13): MAGNESIUM: 1.6 mg/dL (ref 1.5–2.5)

## 2014-06-20 NOTE — Telephone Encounter (Signed)
LVM FOR PT REGARDING ADDED APPT....ADVISED TO PICK UP NEW SCHED TODAY

## 2014-06-20 NOTE — Telephone Encounter (Signed)
Per staff message and POF I have scheduled appts. Advised scheduler of appts. JMW  

## 2014-06-20 NOTE — Telephone Encounter (Addendum)
ANC 0.5, WBC 1.0.  Pt received neulasta on 9/19.  Per Dr Vista Mink, neutropenic pxns.  No answer on home#, left msg with instructions.

## 2014-06-21 NOTE — Telephone Encounter (Signed)
Called and spoke to pt. Informed pt of the results and recs per MR. Pt verbalized understanding and denied any further questions or concerns at this time. Order placed for nocturnal O2.

## 2014-06-27 ENCOUNTER — Other Ambulatory Visit (HOSPITAL_BASED_OUTPATIENT_CLINIC_OR_DEPARTMENT_OTHER): Payer: Medicare Other

## 2014-06-27 DIAGNOSIS — C155 Malignant neoplasm of lower third of esophagus: Secondary | ICD-10-CM

## 2014-06-27 DIAGNOSIS — C159 Malignant neoplasm of esophagus, unspecified: Secondary | ICD-10-CM

## 2014-06-27 LAB — CBC WITH DIFFERENTIAL/PLATELET
BASO%: 0.3 % (ref 0.0–2.0)
Basophils Absolute: 0 10e3/uL (ref 0.0–0.1)
EOS%: 0 % (ref 0.0–7.0)
Eosinophils Absolute: 0 10e3/uL (ref 0.0–0.5)
HCT: 30 % — ABNORMAL LOW (ref 38.4–49.9)
HGB: 9.3 g/dL — ABNORMAL LOW (ref 13.0–17.1)
LYMPH%: 5.3 % — ABNORMAL LOW (ref 14.0–49.0)
MCH: 25.2 pg — ABNORMAL LOW (ref 27.2–33.4)
MCHC: 30.8 g/dL — ABNORMAL LOW (ref 32.0–36.0)
MCV: 81.7 fL (ref 79.3–98.0)
MONO#: 0.9 10e3/uL (ref 0.1–0.9)
MONO%: 5.8 % (ref 0.0–14.0)
NEUT#: 14 10e3/uL — ABNORMAL HIGH (ref 1.5–6.5)
NEUT%: 88.6 % — ABNORMAL HIGH (ref 39.0–75.0)
Platelets: 125 10e3/uL — ABNORMAL LOW (ref 140–400)
RBC: 3.67 10e6/uL — ABNORMAL LOW (ref 4.20–5.82)
RDW: 24.9 % — ABNORMAL HIGH (ref 11.0–14.6)
WBC: 15.8 10e3/uL — ABNORMAL HIGH (ref 4.0–10.3)
lymph#: 0.8 10e3/uL — ABNORMAL LOW (ref 0.9–3.3)

## 2014-06-27 LAB — COMPREHENSIVE METABOLIC PANEL (CC13)
ALT: 7 U/L (ref 0–55)
AST: 16 U/L (ref 5–34)
Albumin: 2.9 g/dL — ABNORMAL LOW (ref 3.5–5.0)
Alkaline Phosphatase: 112 U/L (ref 40–150)
Anion Gap: 7 meq/L (ref 3–11)
BUN: 12.6 mg/dL (ref 7.0–26.0)
CO2: 25 meq/L (ref 22–29)
Calcium: 8.7 mg/dL (ref 8.4–10.4)
Chloride: 109 meq/L (ref 98–109)
Creatinine: 0.9 mg/dL (ref 0.7–1.3)
Glucose: 117 mg/dL (ref 70–140)
Potassium: 3.7 meq/L (ref 3.5–5.1)
Sodium: 141 meq/L (ref 136–145)
Total Bilirubin: 0.41 mg/dL (ref 0.20–1.20)
Total Protein: 6.1 g/dL — ABNORMAL LOW (ref 6.4–8.3)

## 2014-06-27 LAB — MAGNESIUM (CC13): Magnesium: 1.6 mg/dL (ref 1.5–2.5)

## 2014-07-04 ENCOUNTER — Telehealth: Payer: Self-pay | Admitting: *Deleted

## 2014-07-04 ENCOUNTER — Other Ambulatory Visit: Payer: Medicare Other

## 2014-07-04 ENCOUNTER — Other Ambulatory Visit (HOSPITAL_BASED_OUTPATIENT_CLINIC_OR_DEPARTMENT_OTHER): Payer: Medicare Other

## 2014-07-04 ENCOUNTER — Ambulatory Visit (HOSPITAL_BASED_OUTPATIENT_CLINIC_OR_DEPARTMENT_OTHER): Payer: Medicare Other | Admitting: Physician Assistant

## 2014-07-04 ENCOUNTER — Ambulatory Visit (HOSPITAL_BASED_OUTPATIENT_CLINIC_OR_DEPARTMENT_OTHER): Payer: Medicare Other

## 2014-07-04 VITALS — BP 134/67 | HR 87 | Temp 98.5°F | Resp 20

## 2014-07-04 DIAGNOSIS — C155 Malignant neoplasm of lower third of esophagus: Secondary | ICD-10-CM

## 2014-07-04 DIAGNOSIS — C159 Malignant neoplasm of esophagus, unspecified: Secondary | ICD-10-CM

## 2014-07-04 DIAGNOSIS — C7951 Secondary malignant neoplasm of bone: Secondary | ICD-10-CM

## 2014-07-04 DIAGNOSIS — C787 Secondary malignant neoplasm of liver and intrahepatic bile duct: Secondary | ICD-10-CM

## 2014-07-04 DIAGNOSIS — Z5111 Encounter for antineoplastic chemotherapy: Secondary | ICD-10-CM

## 2014-07-04 LAB — MAGNESIUM (CC13): MAGNESIUM: 1.9 mg/dL (ref 1.5–2.5)

## 2014-07-04 LAB — CBC WITH DIFFERENTIAL/PLATELET
BASO%: 0.1 % (ref 0.0–2.0)
BASOS ABS: 0 10*3/uL (ref 0.0–0.1)
EOS ABS: 0 10*3/uL (ref 0.0–0.5)
EOS%: 0.1 % (ref 0.0–7.0)
HCT: 32.9 % — ABNORMAL LOW (ref 38.4–49.9)
HEMOGLOBIN: 10.2 g/dL — AB (ref 13.0–17.1)
LYMPH%: 8.6 % — ABNORMAL LOW (ref 14.0–49.0)
MCH: 25.9 pg — ABNORMAL LOW (ref 27.2–33.4)
MCHC: 31 g/dL — ABNORMAL LOW (ref 32.0–36.0)
MCV: 83.5 fL (ref 79.3–98.0)
MONO#: 1.2 10*3/uL — AB (ref 0.1–0.9)
MONO%: 9.1 % (ref 0.0–14.0)
NEUT%: 82.1 % — ABNORMAL HIGH (ref 39.0–75.0)
NEUTROS ABS: 10.7 10*3/uL — AB (ref 1.5–6.5)
Platelets: 250 10*3/uL (ref 140–400)
RBC: 3.94 10*6/uL — AB (ref 4.20–5.82)
RDW: 23.9 % — ABNORMAL HIGH (ref 11.0–14.6)
WBC: 13.1 10*3/uL — AB (ref 4.0–10.3)
lymph#: 1.1 10*3/uL (ref 0.9–3.3)

## 2014-07-04 LAB — COMPREHENSIVE METABOLIC PANEL (CC13)
ALBUMIN: 3.1 g/dL — AB (ref 3.5–5.0)
ALT: 13 U/L (ref 0–55)
AST: 14 U/L (ref 5–34)
Alkaline Phosphatase: 93 U/L (ref 40–150)
Anion Gap: 8 mEq/L (ref 3–11)
BUN: 15.9 mg/dL (ref 7.0–26.0)
CO2: 24 meq/L (ref 22–29)
Calcium: 9.8 mg/dL (ref 8.4–10.4)
Chloride: 106 mEq/L (ref 98–109)
Creatinine: 0.9 mg/dL (ref 0.7–1.3)
GLUCOSE: 125 mg/dL (ref 70–140)
POTASSIUM: 4 meq/L (ref 3.5–5.1)
SODIUM: 137 meq/L (ref 136–145)
TOTAL PROTEIN: 6.6 g/dL (ref 6.4–8.3)
Total Bilirubin: 0.52 mg/dL (ref 0.20–1.20)

## 2014-07-04 MED ORDER — SODIUM CHLORIDE 0.9 % IV SOLN
Freq: Once | INTRAVENOUS | Status: AC
  Administered 2014-07-04: 09:00:00 via INTRAVENOUS

## 2014-07-04 MED ORDER — SODIUM CHLORIDE 0.9 % IV SOLN
750.0000 mg/m2/d | INTRAVENOUS | Status: DC
  Administered 2014-07-04: 7850 mg via INTRAVENOUS
  Filled 2014-07-04: qty 157

## 2014-07-04 MED ORDER — DEXAMETHASONE SODIUM PHOSPHATE 20 MG/5ML IJ SOLN
INTRAMUSCULAR | Status: AC
  Filled 2014-07-04: qty 5

## 2014-07-04 MED ORDER — PALONOSETRON HCL INJECTION 0.25 MG/5ML
0.2500 mg | Freq: Once | INTRAVENOUS | Status: AC
  Administered 2014-07-04: 0.25 mg via INTRAVENOUS

## 2014-07-04 MED ORDER — POTASSIUM CHLORIDE 2 MEQ/ML IV SOLN
Freq: Once | INTRAVENOUS | Status: AC
  Administered 2014-07-04: 10:00:00 via INTRAVENOUS
  Filled 2014-07-04: qty 10

## 2014-07-04 MED ORDER — FOSAPREPITANT DIMEGLUMINE INJECTION 150 MG
150.0000 mg | Freq: Once | INTRAVENOUS | Status: AC
  Administered 2014-07-04: 150 mg via INTRAVENOUS
  Filled 2014-07-04: qty 5

## 2014-07-04 MED ORDER — DEXAMETHASONE SODIUM PHOSPHATE 20 MG/5ML IJ SOLN
12.0000 mg | Freq: Once | INTRAMUSCULAR | Status: AC
  Administered 2014-07-04: 10:00:00 via INTRAVENOUS

## 2014-07-04 MED ORDER — DOCETAXEL CHEMO INJECTION 160 MG/16ML
75.0000 mg/m2 | Freq: Once | INTRAVENOUS | Status: AC
  Administered 2014-07-04: 160 mg via INTRAVENOUS
  Filled 2014-07-04: qty 16

## 2014-07-04 MED ORDER — SODIUM CHLORIDE 0.9 % IV SOLN
75.0000 mg/m2 | Freq: Once | INTRAVENOUS | Status: AC
  Administered 2014-07-04: 157 mg via INTRAVENOUS
  Filled 2014-07-04: qty 157

## 2014-07-04 MED ORDER — PALONOSETRON HCL INJECTION 0.25 MG/5ML
INTRAVENOUS | Status: AC
  Filled 2014-07-04: qty 5

## 2014-07-04 NOTE — Telephone Encounter (Signed)
Pr note on desk I have moved appts from 10/7 to 10/10

## 2014-07-04 NOTE — Patient Instructions (Signed)
[facility] Discharge Instructions for Patients Receiving Chemotherapy  Today you received the following chemotherapy agents Taxotere, Cisplatin, Flurourocil  To help prevent nausea and vomiting after your treatment, we encourage you to take your nausea medication Zofran  As directed   If you develop nausea and vomiting that is not controlled by your nausea medication, call the clinic.   BELOW ARE SYMPTOMS THAT SHOULD BE REPORTED IMMEDIATELY:  *FEVER GREATER THAN 100.5 F  *CHILLS WITH OR WITHOUT FEVER  NAUSEA AND VOMITING THAT IS NOT CONTROLLED WITH YOUR NAUSEA MEDICATION  *UNUSUAL SHORTNESS OF BREATH  *UNUSUAL BRUISING OR BLEEDING  TENDERNESS IN MOUTH AND THROAT WITH OR WITHOUT PRESENCE OF ULCERS  *URINARY PROBLEMS  *BOWEL PROBLEMS  UNUSUAL RASH Items with * indicate a potential emergency and should be followed up as soon as possible.  Feel free to call the clinic you have any questions or concerns. The clinic phone number is [phone].

## 2014-07-06 ENCOUNTER — Ambulatory Visit: Payer: Medicare Other

## 2014-07-07 NOTE — Progress Notes (Addendum)
[facility] [phone]   Fax:([phone]) [place], [address] [place] [address]  DIAGNOSIS: Metastatic esophageal adenocarcinoma initially diagnosed as Locally advanced, stage IIIB (T3, N2, M0) distal esophageal adenocarcinoma diagnosed in May of 2015  PRIOR THERAPY: Concurrent chemoradiation with weekly carboplatin for AUC of 2 and paclitaxel 45 mg/M2 for 5 weeks. Last dose was given on 03/29/1999  CURRENT THERAPY: Systemic chemotherapy with cisplatin 75 mg/M2, docetaxel 75 mg/M2 on day 1 and 5-FU 750 mg/M2 continuous infusion for 5 days. First cycle on 05/23/2014. Status post 2 cycles.  INTERVAL HISTORY: [name] [name] 68 y.o. male returns to the clinic today for a follow up visit. He is now status post 2 cycles of chemotherapy with cisplatin, docetaxel and continuous infusion 5-FU. Overall he is tolerating his chemotherapy relatively well. He does report occasional episodes of constipation which are well managed with over-the-counter stool softeners. The patient denied having any significant weight loss or night sweats. He has no nausea or vomiting. He denied having any significant fever or chills. He has no chest pain or hemoptysis. He denied having any significant nausea or vomiting but continues to have mild odynophagia.   MEDICAL HISTORY: Past Medical History  Diagnosis Date  . GERD (gastroesophageal reflux disease)   . Hypertension   . Hyperlipemia   . 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 Barrett's esophagus 12/2013  . History of radiation therapy 02/23/14- 04/04/14    esophagus 5040 cGy 28 sessions    ALLERGIES:  has No Known Allergies.  MEDICATIONS:  Current Outpatient Prescriptions  Medication Sig Dispense Refill  . cloNIDine (CATAPRES) 0.1 MG tablet Take 0.1 mg by mouth daily as needed (will take with diovan hct if blood pressure is greater than 130/80).       Marland Kitchen  dexamethasone (DECADRON) 4 MG tablet 2 tab po bid the day before, day of and day after chemo every 3 weeks.  40 tablet  1  . dutasteride (AVODART) 0.5 MG capsule Take 0.5 mg by mouth every evening.       . Garlic 2725 MG CAPS Take 1 capsule by mouth daily.      Marland Kitchen lidocaine-prilocaine (EMLA) cream Apply 1 application topically as needed.  30 g  0  . magnesium hydroxide (MILK OF MAGNESIA) 400 MG/5ML suspension Take 5 mLs by mouth daily as needed for mild constipation.      . Omega-3 Fatty Acids (FISH OIL) 1000 MG CAPS Take 1,000 mg by mouth daily.      Marland Kitchen omeprazole (PRILOSEC) 40 MG capsule Take 40 mg by mouth daily.      . prochlorperazine (COMPAZINE) 10 MG tablet Take 1 tablet (10 mg total) by mouth every 6 (six) hours as needed for nausea or vomiting.  30 tablet  1  . simvastatin (ZOCOR) 40 MG tablet Take 40 mg by mouth daily.       . valsartan-hydrochlorothiazide (DIOVAN-HCT) 160-25 MG per tablet Take 1 tablet by mouth daily as needed (If blood pressure is greater than 130/80).       . vitamin E (VITAMIN E) 1000 UNIT capsule Take 1,000 Units by mouth daily.       No current facility-administered medications for this visit.   Facility-Administered Medications Ordered in Other Visits  Medication Dose Route Frequency Provider Last Rate Last Dose  . sodium chloride 0.9 % injection 10 mL  10 mL Intracatheter  PRN Curt Bears, MD   10 mL at 05/28/14 1235    SURGICAL HISTORY:  Past Surgical History  Procedure Laterality Date  . Eus N/A 02/03/2014    Procedure: UPPER ENDOSCOPIC ULTRASOUND (EUS) LINEAR;  Surgeon: Milus Banister, MD;  Location: [facility];  Service: Endoscopy;  Laterality: N/A;    REVIEW OF SYSTEMS:  Constitutional: positive for fatigue Eyes: negative Ears, nose, mouth, throat, and face: negative Respiratory: positive for dyspnea on exertion Cardiovascular: negative Gastrointestinal: positive for odynophagia Genitourinary:negative Integument/breast:  negative Hematologic/lymphatic: negative Musculoskeletal:negative Neurological: negative Behavioral/Psych: negative Endocrine: negative Allergic/Immunologic: negative   PHYSICAL EXAMINATION: General appearance: alert, cooperative, fatigued and no distress Head: Normocephalic, without obvious abnormality, atraumatic Neck: no adenopathy, no JVD, supple, symmetrical, trachea midline and thyroid not enlarged, symmetric, no tenderness/mass/nodules Lymph nodes: Cervical, supraclavicular, and axillary nodes normal. Resp: clear to auscultation bilaterally Back: symmetric, no curvature. ROM normal. No CVA tenderness. Cardio: regular rate and rhythm, S1, S2 normal, no murmur, click, rub or gallop GI: soft, non-tender; bowel sounds normal; no masses,  no organomegaly Extremities: extremities normal, atraumatic, no cyanosis or edema and clubbing of the fingernails is noted bilaterally Neurologic: Alert and oriented X 3, normal strength and tone. Normal symmetric reflexes. Normal coordination and gait  ECOG PERFORMANCE STATUS: 1 - Symptomatic but completely ambulatory  There were no vitals taken for this visit.  LABORATORY DATA: Lab Results  Component Value Date   WBC 13.1* 07/04/2014   HGB 10.2* 07/04/2014   HCT 32.9* 07/04/2014   MCV 83.5 07/04/2014   PLT 250 07/04/2014      Chemistry      Component Value Date/Time   NA 137 07/04/2014 0831   NA 138 01/26/2014 1148   K 4.0 07/04/2014 0831   K 3.9 01/26/2014 1148   CL 104 01/26/2014 1148   CO2 24 07/04/2014 0831   CO2 29 01/26/2014 1148   BUN 15.9 07/04/2014 0831   BUN 11 01/26/2014 1148   CREATININE 0.9 07/04/2014 0831   CREATININE 1.0 01/26/2014 1148      Component Value Date/Time   CALCIUM 9.8 07/04/2014 0831   CALCIUM 8.8 01/26/2014 1148   ALKPHOS 93 07/04/2014 0831   ALKPHOS 62 01/26/2014 1148   AST 14 07/04/2014 0831   AST 12 01/26/2014 1148   ALT 13 07/04/2014 0831   ALT 9 01/26/2014 1148   BILITOT 0.52 07/04/2014 0831   BILITOT 0.6  01/26/2014 1148       RADIOGRAPHIC STUDIES: Nm Pet Image Restag (ps) Skull Base To Thigh  05/16/2014   CLINICAL DATA:  Subsequent treatment strategy for esophageal cancer.  EXAM: NUCLEAR MEDICINE PET SKULL BASE TO THIGH  TECHNIQUE: 9.95 mCi F-18 FDG was injected intravenously. Full-ring PET imaging was performed from the skull base to thigh after the radiotracer. CT data was obtained and used for attenuation correction and anatomic localization.  FASTING BLOOD GLUCOSE:  Value: 103 mg/dl  COMPARISON:  02/11/2014  FINDINGS: NECK  No hypermetabolic lymph nodes in the neck.  CHEST  Dilated and patulous thoracic esophagus is again identified. Heart size appears normal. There is calcified atherosclerotic disease involving the thoracic aorta. Calcified prevascular lymph nodes are again noted. Persistent FDG uptake associated with the sub- carinal lymph node has an SUV max equal to 3.9. Previously 3.75. Interval resolution of hypermetabolic distal esophageal mass.  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 No hypermetabolic pulmonary nodule or mass identified on  today's exam. There are small bilateral pleural effusions noted bilateral. New from previous exam.  ABDOMEN/PELVIS  Within the lateral segment of left hepatic lobe there is a low attenuation structure measuring 2.9 cm. This has an SUV max equal to 4.47. There has been resolution of previous hypermetabolic upper abdominal lymph nodes. Mild increased uptake associated with peripancreatic lymph node has an SUV max equal to 3.79. Previously 4.40.  SKELETON  Abnormal increased radiotracer uptake associated with the right fourth rib is again identified. On today's exam the SUV max is equal to 5.72. Previously 2.8 cm. On the corresponding CT images there is abnormal asymmetric sclerosis within this rib compatible with metastasis.  IMPRESSION: 1. Mixed  response to therapy. There has been resolution of abnormal hyper metabolism associated with the distal esophageal mass. 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 2. 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 3. Suspicious focus of increased uptake within the lateral segment of left hepatic lobe with associated the low-attenuation lesion lung corresponding CT images. This is worrisome for liver metastasis. Contrast enhanced MRI of the liver may provide further characterization of this lesion. 4. Stable, increased radiotracer uptake associated with sub carinal lymph node which in the setting of interstitial lung disease is nonspecific.   Electronically Signed   By: Kerby Moors M.D.   On: 05/16/2014 11:55   ASSESSMENT AND PLAN: This is a very pleasant 68 years old [place] American male with metastatic esophageal adenocarcinoma initially diagnosed as locally advanced disease status post a course of concurrent chemoradiation with weekly carboplatin and paclitaxel. His recent PET scan showed evidence for disease progression with new liver lesion in addition to metastatic bone lesions but there was significant improvement in the distal esophageal mass with resolution of the dysphagia. He is currently being treated with systemic chemotherapy with cisplatin, docetaxel and continuous infusion 5-FU every 3 weeks. He is status post 2 cycles. The patient was discussed with and also seen by [name]. He will proceed with cycle #3 today as scheduled He will follow up in 3 weeks with a restaging CT scan of the chest and abdomen to re-evaluate his disease.   The patient voices understanding of current disease status and treatment options and is in agreement with the current care plan.  All questions were answered. The patient knows to call the clinic with any  problems, questions or concerns. We can certainly see the patient much sooner if necessary.  Carlton Adam PA-C  ADDENDUM: Hematology/Oncology Attending: I had a face to face encounter with the patient. I recommended his care plan. This is a very pleasant 68 years old African American male with metastatic esophageal adenocarcinoma currently undergoing systemic chemotherapy with cisplatin, docetaxel and continuous infusion 5-FU status post 2 cycles. He is rating his treatment fairly well except for mild fatigue and few episodes of constipation. I recommended for the patient to proceed with cycle #3 today as scheduled. He would come back for followup visit in 3 weeks for evaluation after repeating CT scan of the chest, abdomen and pelvis for staging of his disease. He was advised to call immediately if he has any concerning symptoms in the interval.  Disclaimer: This note was dictated with voice recognition software. Similar sounding words can inadvertently be transcribed and may be missed upon review. Eilleen Kempf., MD 07/08/2014

## 2014-07-07 NOTE — Patient Instructions (Signed)
Continue labs and chemotherapy as scheduled Follow-up in 3 weeks with a restaging CT scan of your chest and abdomen to reevaluate your disease 

## 2014-07-09 ENCOUNTER — Ambulatory Visit: Payer: Medicare Other

## 2014-07-09 ENCOUNTER — Ambulatory Visit (HOSPITAL_BASED_OUTPATIENT_CLINIC_OR_DEPARTMENT_OTHER): Payer: Medicare Other

## 2014-07-09 VITALS — BP 135/52 | HR 95 | Temp 98.6°F | Resp 18

## 2014-07-09 DIAGNOSIS — C155 Malignant neoplasm of lower third of esophagus: Secondary | ICD-10-CM

## 2014-07-09 DIAGNOSIS — C159 Malignant neoplasm of esophagus, unspecified: Secondary | ICD-10-CM

## 2014-07-09 DIAGNOSIS — Z5189 Encounter for other specified aftercare: Secondary | ICD-10-CM

## 2014-07-09 DIAGNOSIS — C7951 Secondary malignant neoplasm of bone: Secondary | ICD-10-CM

## 2014-07-09 DIAGNOSIS — C787 Secondary malignant neoplasm of liver and intrahepatic bile duct: Secondary | ICD-10-CM

## 2014-07-09 MED ORDER — SODIUM CHLORIDE 0.9 % IJ SOLN
10.0000 mL | INTRAMUSCULAR | Status: DC | PRN
  Administered 2014-07-09: 10 mL
  Filled 2014-07-09: qty 10

## 2014-07-09 MED ORDER — PEGFILGRASTIM INJECTION 6 MG/0.6ML
6.0000 mg | Freq: Once | SUBCUTANEOUS | Status: AC
  Administered 2014-07-09: 6 mg via SUBCUTANEOUS

## 2014-07-09 MED ORDER — HEPARIN SOD (PORK) LOCK FLUSH 100 UNIT/ML IV SOLN
500.0000 [IU] | Freq: Once | INTRAVENOUS | Status: AC | PRN
  Administered 2014-07-09: 500 [IU]
  Filled 2014-07-09: qty 5

## 2014-07-09 NOTE — Patient Instructions (Signed)
Pegfilgrastim injection What is this medicine? PEGFILGRASTIM (peg fil GRA stim) is a long-acting granulocyte colony-stimulating factor that stimulates the growth of neutrophils, a type of white blood cell important in the body's fight against infection. It is used to reduce the incidence of fever and infection in patients with certain types of cancer who are receiving chemotherapy that affects the bone marrow. This medicine may be used for other purposes; ask your health care provider or pharmacist if you have questions. COMMON BRAND NAME(S): Neulasta What should I tell my health care provider before I take this medicine? They need to know if you have any of these conditions: -latex allergy -ongoing radiation therapy -sickle cell disease -skin reactions to acrylic adhesives (On-Body Injector only) -an unusual or allergic reaction to pegfilgrastim, filgrastim, other medicines, foods, dyes, or preservatives -pregnant or trying to get pregnant -breast-feeding How should I use this medicine? This medicine is for injection under the skin. If you get this medicine at home, you will be taught how to prepare and give the pre-filled syringe or how to use the On-body Injector. Refer to the patient Instructions for Use for detailed instructions. Use exactly as directed. Take your medicine at regular intervals. Do not take your medicine more often than directed. It is important that you put your used needles and syringes in a special sharps container. Do not put them in a trash can. If you do not have a sharps container, call your pharmacist or healthcare provider to get one. Talk to your pediatrician regarding the use of this medicine in children. Special care may be needed. Overdosage: If you think you have taken too much of this medicine contact a poison control center or emergency room at once. NOTE: This medicine is only for you. Do not share this medicine with others. What if I miss a dose? It is  important not to miss your dose. Call your doctor or health care professional if you miss your dose. If you miss a dose due to an On-body Injector failure or leakage, a new dose should be administered as soon as possible using a single prefilled syringe for manual use. What may interact with this medicine? Interactions have not been studied. Give your health care provider a list of all the medicines, herbs, non-prescription drugs, or dietary supplements you use. Also tell them if you smoke, drink alcohol, or use illegal drugs. Some items may interact with your medicine. This list may not describe all possible interactions. Give your health care provider a list of all the medicines, herbs, non-prescription drugs, or dietary supplements you use. Also tell them if you smoke, drink alcohol, or use illegal drugs. Some items may interact with your medicine. What should I watch for while using this medicine? You may need blood work done while you are taking this medicine. If you are going to need a MRI, CT scan, or other procedure, tell your doctor that you are using this medicine (On-Body Injector only). What side effects may I notice from receiving this medicine? Side effects that you should report to your doctor or health care professional as soon as possible: -allergic reactions like skin rash, itching or hives, swelling of the face, lips, or tongue -dizziness -fever -pain, redness, or irritation at site where injected -pinpoint red spots on the skin -shortness of breath or breathing problems -stomach or side pain, or pain at the shoulder -swelling -tiredness -trouble passing urine Side effects that usually do not require medical attention (report to your doctor   or health care professional if they continue or are bothersome): -bone pain -muscle pain This list may not describe all possible side effects. Call your doctor for medical advice about side effects. You may report side effects to FDA at  1-800-FDA-1088. Where should I keep my medicine? Keep out of the reach of children. Store pre-filled syringes in a refrigerator between 2 and 8 degrees C (36 and 46 degrees F). Do not freeze. Keep in carton to protect from light. Throw away this medicine if it is left out of the refrigerator for more than 48 hours. Throw away any unused medicine after the expiration date. NOTE: This sheet is a summary. It may not cover all possible information. If you have questions about this medicine, talk to your doctor, pharmacist, or health care provider.  2015, Elsevier/Gold Standard. (2013-12-16 16:14:05)  

## 2014-07-11 ENCOUNTER — Telehealth: Payer: Self-pay | Admitting: *Deleted

## 2014-07-11 ENCOUNTER — Other Ambulatory Visit: Payer: Medicare Other

## 2014-07-11 ENCOUNTER — Ambulatory Visit (HOSPITAL_BASED_OUTPATIENT_CLINIC_OR_DEPARTMENT_OTHER): Payer: Medicare Other

## 2014-07-11 DIAGNOSIS — C159 Malignant neoplasm of esophagus, unspecified: Secondary | ICD-10-CM

## 2014-07-11 DIAGNOSIS — C155 Malignant neoplasm of lower third of esophagus: Secondary | ICD-10-CM

## 2014-07-11 LAB — CBC WITH DIFFERENTIAL/PLATELET
BASO%: 4 % — ABNORMAL HIGH (ref 0.0–2.0)
Basophils Absolute: 0 10*3/uL (ref 0.0–0.1)
EOS ABS: 0 10*3/uL (ref 0.0–0.5)
EOS%: 2 % (ref 0.0–7.0)
HCT: 31.9 % — ABNORMAL LOW (ref 38.4–49.9)
HEMOGLOBIN: 10.1 g/dL — AB (ref 13.0–17.1)
LYMPH#: 0.4 10*3/uL — AB (ref 0.9–3.3)
LYMPH%: 44.4 % (ref 14.0–49.0)
MCH: 26.1 pg — ABNORMAL LOW (ref 27.2–33.4)
MCHC: 31.7 g/dL — ABNORMAL LOW (ref 32.0–36.0)
MCV: 82.4 fL (ref 79.3–98.0)
MONO#: 0.1 10*3/uL (ref 0.1–0.9)
MONO%: 8.1 % (ref 0.0–14.0)
NEUT#: 0.4 10*3/uL — CL (ref 1.5–6.5)
NEUT%: 41.5 % (ref 39.0–75.0)
NRBC: 0 % (ref 0–0)
Platelets: 101 10*3/uL — ABNORMAL LOW (ref 140–400)
RBC: 3.87 10*6/uL — ABNORMAL LOW (ref 4.20–5.82)
RDW: 23.9 % — AB (ref 11.0–14.6)
WBC: 1 10*3/uL — AB (ref 4.0–10.3)

## 2014-07-11 LAB — COMPREHENSIVE METABOLIC PANEL (CC13)
ALBUMIN: 3.1 g/dL — AB (ref 3.5–5.0)
ALT: 13 U/L (ref 0–55)
ANION GAP: 10 meq/L (ref 3–11)
AST: 14 U/L (ref 5–34)
Alkaline Phosphatase: 71 U/L (ref 40–150)
BUN: 15.6 mg/dL (ref 7.0–26.0)
CO2: 25 meq/L (ref 22–29)
Calcium: 9.1 mg/dL (ref 8.4–10.4)
Chloride: 104 mEq/L (ref 98–109)
Creatinine: 0.9 mg/dL (ref 0.7–1.3)
Glucose: 142 mg/dl — ABNORMAL HIGH (ref 70–140)
POTASSIUM: 3.9 meq/L (ref 3.5–5.1)
SODIUM: 139 meq/L (ref 136–145)
TOTAL PROTEIN: 6.5 g/dL (ref 6.4–8.3)
Total Bilirubin: 1.17 mg/dL (ref 0.20–1.20)

## 2014-07-11 LAB — MAGNESIUM (CC13): MAGNESIUM: 1.5 mg/dL (ref 1.5–2.5)

## 2014-07-11 NOTE — Telephone Encounter (Signed)
ANC 0.4, WBC 1.0.  Per Dr Vista Mink, neutropenic pxns, pt received neulasta 10/10.  Called and spoke to pt with pt regarding neutropenic pxns.  Pt verbalized understanding.

## 2014-07-11 NOTE — Telephone Encounter (Signed)
Message copied by Genia Plants on Mon Jul 11, 2014  4:47 PM ------      Message from: Curt Bears      Created: Mon Jul 11, 2014  3:42 PM       Call patient with the result and give neutropenic precautions. ------

## 2014-07-11 NOTE — Telephone Encounter (Signed)
Already completed

## 2014-07-11 NOTE — Progress Notes (Signed)
Quick Note:  Call patient with the result and give neutropenic precautions. ______

## 2014-07-12 ENCOUNTER — Telehealth: Payer: Self-pay | Admitting: Internal Medicine

## 2014-07-12 NOTE — Telephone Encounter (Signed)
lvm for pt regarding to OCT and NOV appt....mailed pt avs adn letter

## 2014-07-13 ENCOUNTER — Telehealth: Payer: Self-pay | Admitting: Medical Oncology

## 2014-07-13 NOTE — Telephone Encounter (Signed)
Message copied by Ardeen Garland on Wed Jul 13, 2014  9:27 AM ------      Message from: [name] [name]      Created: Mon Jul 11, 2014  3:42 PM       Call patient with the result and give neutropenic precautions. ------

## 2014-07-13 NOTE — Telephone Encounter (Signed)
Pt called back and i reviewed neutrapenic precautions.

## 2014-07-13 NOTE — Telephone Encounter (Signed)
I left message to pt  about neutropenic precautions and when to call . I asked him to

## 2014-07-18 ENCOUNTER — Other Ambulatory Visit (HOSPITAL_BASED_OUTPATIENT_CLINIC_OR_DEPARTMENT_OTHER): Payer: Medicare Other

## 2014-07-18 ENCOUNTER — Encounter (HOSPITAL_COMMUNITY): Payer: Self-pay

## 2014-07-18 ENCOUNTER — Ambulatory Visit (HOSPITAL_COMMUNITY)
Admission: RE | Admit: 2014-07-18 | Discharge: 2014-07-18 | Disposition: A | Discharge status: Home / Self Care | Payer: Medicare Other | Source: Ambulatory Visit | Attending: Physician Assistant | Admitting: Physician Assistant

## 2014-07-18 DIAGNOSIS — K2270 Barrett's esophagus without dysplasia: Secondary | ICD-10-CM | POA: Insufficient documentation

## 2014-07-18 DIAGNOSIS — C155 Malignant neoplasm of lower third of esophagus: Secondary | ICD-10-CM | POA: Diagnosis present

## 2014-07-18 DIAGNOSIS — C159 Malignant neoplasm of esophagus, unspecified: Secondary | ICD-10-CM

## 2014-07-18 LAB — CBC WITH DIFFERENTIAL/PLATELET
BASO%: 0.2 % (ref 0.0–2.0)
Basophils Absolute: 0 10*3/uL (ref 0.0–0.1)
EOS ABS: 0 10*3/uL (ref 0.0–0.5)
EOS%: 0 % (ref 0.0–7.0)
HCT: 32.2 % — ABNORMAL LOW (ref 38.4–49.9)
HGB: 9.7 g/dL — ABNORMAL LOW (ref 13.0–17.1)
LYMPH#: 0.9 10*3/uL (ref 0.9–3.3)
LYMPH%: 5.2 % — ABNORMAL LOW (ref 14.0–49.0)
MCH: 25.2 pg — ABNORMAL LOW (ref 27.2–33.4)
MCHC: 30.1 g/dL — AB (ref 32.0–36.0)
MCV: 83.8 fL (ref 79.3–98.0)
MONO#: 1.1 10*3/uL — AB (ref 0.1–0.9)
MONO%: 6.5 % (ref 0.0–14.0)
NEUT%: 88.1 % — AB (ref 39.0–75.0)
NEUTROS ABS: 15.4 10*3/uL — AB (ref 1.5–6.5)
PLATELETS: 109 10*3/uL — AB (ref 140–400)
RBC: 3.84 10*6/uL — ABNORMAL LOW (ref 4.20–5.82)
RDW: 27.4 % — ABNORMAL HIGH (ref 11.0–14.6)
WBC: 17.5 10*3/uL — AB (ref 4.0–10.3)

## 2014-07-18 LAB — COMPREHENSIVE METABOLIC PANEL (CC13)
ALT: 13 U/L (ref 0–55)
ANION GAP: 9 meq/L (ref 3–11)
AST: 14 U/L (ref 5–34)
Albumin: 3 g/dL — ABNORMAL LOW (ref 3.5–5.0)
Alkaline Phosphatase: 112 U/L (ref 40–150)
BUN: 14.1 mg/dL (ref 7.0–26.0)
CALCIUM: 8.8 mg/dL (ref 8.4–10.4)
CHLORIDE: 107 meq/L (ref 98–109)
CO2: 24 meq/L (ref 22–29)
Creatinine: 1 mg/dL (ref 0.7–1.3)
GLUCOSE: 92 mg/dL (ref 70–140)
Potassium: 3.4 mEq/L — ABNORMAL LOW (ref 3.5–5.1)
Sodium: 140 mEq/L (ref 136–145)
TOTAL PROTEIN: 5.8 g/dL — AB (ref 6.4–8.3)
Total Bilirubin: 0.49 mg/dL (ref 0.20–1.20)

## 2014-07-18 LAB — MAGNESIUM (CC13): MAGNESIUM: 1.6 mg/dL (ref 1.5–2.5)

## 2014-07-18 MED ORDER — IOHEXOL 300 MG/ML  SOLN
100.0000 mL | Freq: Once | INTRAMUSCULAR | Status: AC | PRN
  Administered 2014-07-18: 100 mL via INTRAVENOUS

## 2014-07-18 MED ORDER — IOHEXOL 300 MG/ML  SOLN
50.0000 mL | Freq: Once | INTRAMUSCULAR | Status: AC | PRN
  Administered 2014-07-18: 50 mL via ORAL

## 2014-07-22 ENCOUNTER — Ambulatory Visit (HOSPITAL_BASED_OUTPATIENT_CLINIC_OR_DEPARTMENT_OTHER): Payer: Medicare Other | Admitting: Physician Assistant

## 2014-07-22 ENCOUNTER — Encounter: Payer: Self-pay | Admitting: Physician Assistant

## 2014-07-22 ENCOUNTER — Telehealth: Payer: Self-pay | Admitting: Internal Medicine

## 2014-07-22 ENCOUNTER — Other Ambulatory Visit (HOSPITAL_BASED_OUTPATIENT_CLINIC_OR_DEPARTMENT_OTHER): Payer: Medicare Other

## 2014-07-22 VITALS — BP 130/72 | HR 83 | Temp 97.8°F | Resp 18 | Ht 67.0 in | Wt 202.8 lb

## 2014-07-22 DIAGNOSIS — C159 Malignant neoplasm of esophagus, unspecified: Secondary | ICD-10-CM

## 2014-07-22 DIAGNOSIS — C155 Malignant neoplasm of lower third of esophagus: Secondary | ICD-10-CM

## 2014-07-22 DIAGNOSIS — I10 Essential (primary) hypertension: Secondary | ICD-10-CM

## 2014-07-22 LAB — CBC WITH DIFFERENTIAL/PLATELET
BASO%: 0.1 % (ref 0.0–2.0)
Basophils Absolute: 0 10*3/uL (ref 0.0–0.1)
EOS%: 0 % (ref 0.0–7.0)
Eosinophils Absolute: 0 10*3/uL (ref 0.0–0.5)
HCT: 31.8 % — ABNORMAL LOW (ref 38.4–49.9)
HGB: 9.9 g/dL — ABNORMAL LOW (ref 13.0–17.1)
LYMPH%: 8.8 % — ABNORMAL LOW (ref 14.0–49.0)
MCH: 26.6 pg — AB (ref 27.2–33.4)
MCHC: 31.1 g/dL — AB (ref 32.0–36.0)
MCV: 85.5 fL (ref 79.3–98.0)
MONO#: 0.9 10*3/uL (ref 0.1–0.9)
MONO%: 8.7 % (ref 0.0–14.0)
NEUT#: 8 10*3/uL — ABNORMAL HIGH (ref 1.5–6.5)
NEUT%: 82.4 % — ABNORMAL HIGH (ref 39.0–75.0)
Platelets: 149 10*3/uL (ref 140–400)
RBC: 3.72 10*6/uL — AB (ref 4.20–5.82)
RDW: 25.6 % — AB (ref 11.0–14.6)
WBC: 9.7 10*3/uL (ref 4.0–10.3)
lymph#: 0.9 10*3/uL (ref 0.9–3.3)

## 2014-07-22 LAB — COMPREHENSIVE METABOLIC PANEL (CC13)
ALBUMIN: 2.9 g/dL — AB (ref 3.5–5.0)
ALK PHOS: 104 U/L (ref 40–150)
ALT: 10 U/L (ref 0–55)
AST: 13 U/L (ref 5–34)
Anion Gap: 7 mEq/L (ref 3–11)
BUN: 10.8 mg/dL (ref 7.0–26.0)
CO2: 27 mEq/L (ref 22–29)
Calcium: 8.8 mg/dL (ref 8.4–10.4)
Chloride: 107 mEq/L (ref 98–109)
Creatinine: 0.9 mg/dL (ref 0.7–1.3)
Glucose: 111 mg/dl (ref 70–140)
Potassium: 3.7 mEq/L (ref 3.5–5.1)
SODIUM: 142 meq/L (ref 136–145)
Total Bilirubin: 0.42 mg/dL (ref 0.20–1.20)
Total Protein: 5.7 g/dL — ABNORMAL LOW (ref 6.4–8.3)

## 2014-07-22 LAB — MAGNESIUM (CC13): Magnesium: 1.6 mg/dl (ref 1.5–2.5)

## 2014-07-22 NOTE — Telephone Encounter (Signed)
gv and printed appt sched and avs for pt for OCT thru Dec....sed added tx.

## 2014-07-22 NOTE — Progress Notes (Signed)
[place] [phone]   [phone]  OFFFICE PROGRESS NOTE  [name], MD [place] [address]  DIAGNOSIS: Metastatic esophageal adenocarcinoma initially diagnosed as Locally advanced, stage IIIB (T3, N2, M0) distal esophageal adenocarcinoma diagnosed in May of 2015  PRIOR THERAPY: Concurrent chemoradiation with weekly carboplatin for AUC of 2 and paclitaxel 45 mg/M2 for 5 weeks. Last dose was given on 03/29/1999  CURRENT THERAPY: Systemic chemotherapy with cisplatin 75 mg/M2, docetaxel 75 mg/M2 on day 1 and 5-FU 750 mg/M2 continuous infusion for 5 days. First cycle on 05/23/2014. Status post 3 cycles.  INTERVAL HISTORY: [name] [name] 68 y.o. male returns to the clinic today for a follow up visit. He is now status post 3 cycles of chemotherapy with cisplatin, docetaxel and continuous infusion 5-FU. Overall he is tolerating his chemotherapy relatively well. He does report occasional episodes of constipation which are well managed with over-the-counter stool softeners. The patient denied having any significant weight loss or night sweats. He has no nausea or vomiting. He denied having any significant fever or chills. He has no chest pain or hemoptysis. He denied having any significant nausea or vomiting but continues to have mild odynophagia. Recently and he had restaging CT scan of the chest and abdomen with contrast to reevaluate his disease and is here to discuss the results.  MEDICAL HISTORY: Past Medical History  Diagnosis Date  . GERD (gastroesophageal reflux disease)   . Hyperlipemia   . Barrett's esophagus 12/2013  . History of radiation therapy 02/23/14- 04/04/14    esophagus 5040 cGy 28 sessions  . 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 Hypertension     ALLERGIES:  has No Known Allergies.  MEDICATIONS:  Current Outpatient Prescriptions  Medication Sig Dispense Refill  . cloNIDine  (CATAPRES) 0.1 MG tablet Take 0.1 mg by mouth daily as needed (will take with diovan hct if blood pressure is greater than 130/80).       Marland Kitchen dexamethasone (DECADRON) 4 MG tablet 2 tab po bid the day before, day of and day after chemo every 3 weeks.  40 tablet  1  . dutasteride (AVODART) 0.5 MG capsule Take 0.5 mg by mouth every evening.       . Garlic 4174 MG CAPS Take 1 capsule by mouth daily.      Marland Kitchen lidocaine-prilocaine (EMLA) cream Apply 1 application topically as needed.  30 g  0  . magnesium hydroxide (MILK OF MAGNESIA) 400 MG/5ML suspension Take 5 mLs by mouth daily as needed for mild constipation.      . Omega-3 Fatty Acids (FISH OIL) 1000 MG CAPS Take 1,000 mg by mouth daily.      Marland Kitchen omeprazole (PRILOSEC) 40 MG capsule Take 40 mg by mouth daily.      . prochlorperazine (COMPAZINE) 10 MG tablet Take 1 tablet (10 mg total) by mouth every 6 (six) hours as needed for nausea or vomiting.  30 tablet  1  . simvastatin (ZOCOR) 40 MG tablet Take 40 mg by mouth daily.       . valsartan-hydrochlorothiazide (DIOVAN-HCT) 160-25 MG per tablet Take 1 tablet by mouth daily as needed (If blood pressure is greater than 130/80).       . vitamin E (VITAMIN E) 1000 UNIT capsule Take 1,000 Units by mouth daily.       No current facility-administered medications for this visit.   Facility-Administered Medications Ordered in Other Visits  Medication Dose Route Frequency Provider Last Rate Last Dose  . sodium chloride 0.9 % injection 10 mL  10 mL Intracatheter PRN Curt Bears, MD   10 mL at 05/28/14 1235    SURGICAL HISTORY:  Past Surgical History  Procedure Laterality Date  . Eus N/A 02/03/2014    Procedure: UPPER ENDOSCOPIC ULTRASOUND (EUS) LINEAR;  Surgeon: Milus Banister, MD;  Location: [facility];  Service: Endoscopy;  Laterality: N/A;    REVIEW OF SYSTEMS:  Constitutional: positive for fatigue Eyes: negative Ears, nose, mouth, throat, and face: negative Respiratory: positive for dyspnea on  exertion Cardiovascular: negative Gastrointestinal: positive for odynophagia Genitourinary:negative Integument/breast: negative Hematologic/lymphatic: negative Musculoskeletal:negative Neurological: negative Behavioral/Psych: negative Endocrine: negative Allergic/Immunologic: negative   PHYSICAL EXAMINATION: General appearance: alert, cooperative, fatigued and no distress Head: Normocephalic, without obvious abnormality, atraumatic Neck: no adenopathy, no JVD, supple, symmetrical, trachea midline and thyroid not enlarged, symmetric, no tenderness/mass/nodules Lymph nodes: Cervical, supraclavicular, and axillary nodes normal. Resp: clear to auscultation bilaterally Back: symmetric, no curvature. ROM normal. No CVA tenderness. Cardio: regular rate and rhythm, S1, S2 normal, no murmur, click, rub or gallop GI: soft, non-tender; bowel sounds normal; no masses,  no organomegaly Extremities: extremities normal, atraumatic, no cyanosis or edema and clubbing of the fingernails is noted bilaterally Neurologic: Alert and oriented X 3, normal strength and tone. Normal symmetric reflexes. Normal coordination and gait  ECOG PERFORMANCE STATUS: 1 - Symptomatic but completely ambulatory  Blood pressure 130/72, pulse 83, temperature 97.8 F (36.6 C), temperature source Oral, resp. rate 18, height 5\' 7"  (1.702 m), weight 202 lb 12.8 oz (91.989 kg).  LABORATORY DATA: Lab Results  Component Value Date   WBC 9.7 07/22/2014   HGB 9.9* 07/22/2014   HCT 31.8* 07/22/2014   MCV 85.5 07/22/2014   PLT 149 07/22/2014      Chemistry      Component Value Date/Time   NA 142 07/22/2014 1118   NA 138 01/26/2014 1148   K 3.7 07/22/2014 1118   K 3.9 01/26/2014 1148   CL 104 01/26/2014 1148   CO2 27 07/22/2014 1118   CO2 29 01/26/2014 1148   BUN 10.8 07/22/2014 1118   BUN 11 01/26/2014 1148   CREATININE 0.9 07/22/2014 1118   CREATININE 1.0 01/26/2014 1148      Component Value Date/Time   CALCIUM 8.8  07/22/2014 1118   CALCIUM 8.8 01/26/2014 1148   ALKPHOS 104 07/22/2014 1118   ALKPHOS 62 01/26/2014 1148   AST 13 07/22/2014 1118   AST 12 01/26/2014 1148   ALT 10 07/22/2014 1118   ALT 9 01/26/2014 1148   BILITOT 0.42 07/22/2014 1118   BILITOT 0.6 01/26/2014 1148       RADIOGRAPHIC STUDIES: Ct Chest W Contrast  07/18/2014   CLINICAL DATA:  Malignant neoplasm of the lower third of the esophagus, restaging. Barrett's esophagus.  EXAM: CT CHEST, ABDOMEN WITH CONTRAST  TECHNIQUE: Multidetector CT imaging of the chest, abdomen was performed following the standard protocol during bolus administration of intravenous contrast.  CONTRAST:  36mL OMNIPAQUE IOHEXOL 300 MG/ML SOLN, 153mL OMNIPAQUE IOHEXOL 300 MG/ML SOLN  COMPARISON:  Multiple exams, including 05/16/2014 and 01/28/2014  FINDINGS: CT CHEST FINDINGS  Dilated esophagus with air-fluid level. Wall thickening in the distal esophagus circumferentially, possibly with anterior ulceration. This is similar to prior PET-CT, but the degree of esophageal wall thickening is markedly improved compared to 01/28/2014. Subcarinal lymph node 1.0 cm in short axis on image 32 series 2.  Trace bilateral pleural  effusions with mild passive atelectasis.  Centrilobular emphysema. Old granulomatous disease versus broad collapse especially in the lower lobes with bibasilar bronchiectasis and fibrosis.  Sclerotic osseous metastatic disease including the right first and fourth ribs new. The amount of right fourth rib sclerosis is increased.  CT ABDOMEN FINDINGS  Hepatobiliary: Prior area of concern in the lateral segment left hepatic lobe demonstrates only equivocal hypodensity on today's exam, along the posterior inferior margin of segment 3.  Pancreas: Punctate calcifications superiorly along the pancreatic body. Stable.  Spleen: Unremarkable  Adrenals/Urinary Tract: Stable tiny hypodense lesions in both kidneys technically too small to characterize although statistically  likely to be cysts, essentially stable from 01/28/2014.  Stomach/Bowel: Unremarkable  Vascular/Lymphatic: A prior 1.6 cm gastrohepatic ligament lymph node is no longer well seen. Small retrocaval lymph node on image 63 of series 2 measures 0.8 cm in short axis, image 63 of series 2, stable. 9 mm lymph node adjacent to the left adrenal gland on that same image. Aortoiliac atherosclerotic vascular disease.  Musculoskeletal: Unremarkable  IMPRESSION: 1. Mild wall thickening and possible anterior ulceration in the distal esophageal wall, although still greatly improved compared to 01/28/14. 2. The prior area of concern for malignancy in the lateral segment left hepatic lobe is only faintly hypodense on today's exam, without a well-defined focal mass. Continued attention to this region would be suggested given the findings on PET-CT. 3. Resolution of prior 1.6 cm gastrohepatic ligament lymph node. 4. Dilated esophagus with air-fluid level. 5. Centrilobular emphysema. 6. Sclerotic osseous metastatic disease including the right fourth rib and possibly the right first rib. The amount of sclerosis in the fourth rib is increased, but this may simply be from successful treatment and does not necessarily indicate progression of disease. 7. Trace bilateral pleural effusions with passive atelectasis.   Electronically Signed   By: Sherryl Barters M.D.   On: 07/18/2014 11:52   Ct Abdomen W Contrast  07/18/2014   CLINICAL DATA:  Malignant neoplasm of the lower third of the esophagus, restaging. Barrett's esophagus.  EXAM: CT CHEST, ABDOMEN WITH CONTRAST  TECHNIQUE: Multidetector CT imaging of the chest, abdomen was performed following the standard protocol during bolus administration of intravenous contrast.  CONTRAST:  70mL OMNIPAQUE IOHEXOL 300 MG/ML SOLN, 171mL OMNIPAQUE IOHEXOL 300 MG/ML SOLN  COMPARISON:  Multiple exams, including 05/16/2014 and 01/28/2014  FINDINGS: CT CHEST FINDINGS  Dilated esophagus with air-fluid level.  Wall thickening in the distal esophagus circumferentially, possibly with anterior ulceration. This is similar to prior PET-CT, but the degree of esophageal wall thickening is markedly improved compared to 01/28/2014. Subcarinal lymph node 1.0 cm in short axis on image 32 series 2.  Trace bilateral pleural effusions with mild passive atelectasis.  Centrilobular emphysema. Old granulomatous disease versus broad collapse especially in the lower lobes with bibasilar bronchiectasis and fibrosis.  Sclerotic osseous metastatic disease including the right first and fourth ribs new. The amount of right fourth rib sclerosis is increased.  CT ABDOMEN FINDINGS  Hepatobiliary: Prior area of concern in the lateral segment left hepatic lobe demonstrates only equivocal hypodensity on today's exam, along the posterior inferior margin of segment 3.  Pancreas: Punctate calcifications superiorly along the pancreatic body. Stable.  Spleen: Unremarkable  Adrenals/Urinary Tract: Stable tiny hypodense lesions in both kidneys technically too small to characterize although statistically likely to be cysts, essentially stable from 01/28/2014.  Stomach/Bowel: Unremarkable  Vascular/Lymphatic: A prior 1.6 cm gastrohepatic ligament lymph node is no longer well seen. Small retrocaval lymph node on  image 63 of series 2 measures 0.8 cm in short axis, image 63 of series 2, stable. 9 mm lymph node adjacent to the left adrenal gland on that same image. Aortoiliac atherosclerotic vascular disease.  Musculoskeletal: Unremarkable  IMPRESSION: 1. Mild wall thickening and possible anterior ulceration in the distal esophageal wall, although still greatly improved compared to 01/28/14. 2. The prior area of concern for malignancy in the lateral segment left hepatic lobe is only faintly hypodense on today's exam, without a well-defined focal mass. Continued attention to this region would be suggested given the findings on PET-CT. 3. Resolution of prior 1.6 cm  gastrohepatic ligament lymph node. 4. Dilated esophagus with air-fluid level. 5. Centrilobular emphysema. 6. Sclerotic osseous metastatic disease including the right fourth rib and possibly the right first rib. The amount of sclerosis in the fourth rib is increased, but this may simply be from successful treatment and does not necessarily indicate progression of disease. 7. Trace bilateral pleural effusions with passive atelectasis.   Electronically Signed   By: Sherryl Barters M.D.   On: 07/18/2014 11:52   ASSESSMENT AND PLAN: This is a very pleasant 68 years old [place] male with metastatic esophageal adenocarcinoma initially diagnosed as locally advanced disease status post a course of concurrent chemoradiation with weekly carboplatin and paclitaxel. His recent restaging CT scan revealed improvement in his disease, specifically the mild wall thickening and possible anterior ulceration in the distal esophageal wall although still greatly improved compared to 01/28/2014. Prior area of concern for malignancy in the lateral segment of the left hepatic lobe is only faintly hypodense without a well-defined focal mass. Continued attention to this region is suggested given the findings on the PET/CT. There is resolution of a prior 1.6 cm gastrohepatic ligament lymph node. He is currently being treated with systemic chemotherapy with cisplatin, docetaxel and continuous infusion 5-FU every 3 weeks. He is status post 3 cycles.  He will proceed with cycle #4 as scheduled. He will follow up in 3 weeks for another symptom management visit prior to cycle #5.   The patient voices understanding of current disease status and treatment options and is in agreement with the current care plan.  All questions were answered. The patient knows to call the clinic with any problems, questions or concerns. We can certainly see the patient much sooner if necessary.   Disclaimer: This note was dictated with voice  recognition software. Similar sounding words can inadvertently be transcribed and may be missed upon review. Carlton Adam, PA-C 07/22/2014

## 2014-07-24 NOTE — Patient Instructions (Signed)
Continue labs and chemotherapy as scheduled Follow up in 3 weeks 

## 2014-07-25 ENCOUNTER — Other Ambulatory Visit: Payer: Medicare Other

## 2014-07-25 ENCOUNTER — Ambulatory Visit (HOSPITAL_BASED_OUTPATIENT_CLINIC_OR_DEPARTMENT_OTHER): Payer: Medicare Other

## 2014-07-25 ENCOUNTER — Other Ambulatory Visit: Payer: Self-pay | Admitting: Hematology

## 2014-07-25 VITALS — BP 131/67 | HR 82 | Temp 98.8°F

## 2014-07-25 DIAGNOSIS — Z5111 Encounter for antineoplastic chemotherapy: Secondary | ICD-10-CM

## 2014-07-25 DIAGNOSIS — C7951 Secondary malignant neoplasm of bone: Secondary | ICD-10-CM

## 2014-07-25 DIAGNOSIS — C787 Secondary malignant neoplasm of liver and intrahepatic bile duct: Secondary | ICD-10-CM

## 2014-07-25 DIAGNOSIS — C159 Malignant neoplasm of esophagus, unspecified: Secondary | ICD-10-CM

## 2014-07-25 DIAGNOSIS — C155 Malignant neoplasm of lower third of esophagus: Secondary | ICD-10-CM

## 2014-07-25 MED ORDER — SODIUM CHLORIDE 0.9 % IV SOLN
Freq: Once | INTRAVENOUS | Status: AC
  Administered 2014-07-25: 09:00:00 via INTRAVENOUS

## 2014-07-25 MED ORDER — PALONOSETRON HCL INJECTION 0.25 MG/5ML
0.2500 mg | Freq: Once | INTRAVENOUS | Status: AC
  Administered 2014-07-25: 0.25 mg via INTRAVENOUS

## 2014-07-25 MED ORDER — DOCETAXEL CHEMO INJECTION 160 MG/16ML
75.0000 mg/m2 | Freq: Once | INTRAVENOUS | Status: AC
  Administered 2014-07-25: 160 mg via INTRAVENOUS
  Filled 2014-07-25: qty 16

## 2014-07-25 MED ORDER — SODIUM CHLORIDE 0.9 % IV SOLN
75.0000 mg/m2 | Freq: Once | INTRAVENOUS | Status: AC
  Administered 2014-07-25: 157 mg via INTRAVENOUS
  Filled 2014-07-25: qty 157

## 2014-07-25 MED ORDER — PALONOSETRON HCL INJECTION 0.25 MG/5ML
INTRAVENOUS | Status: AC
  Filled 2014-07-25: qty 5

## 2014-07-25 MED ORDER — SODIUM CHLORIDE 0.9 % IV SOLN
150.0000 mg | Freq: Once | INTRAVENOUS | Status: AC
  Administered 2014-07-25: 150 mg via INTRAVENOUS
  Filled 2014-07-25: qty 5

## 2014-07-25 MED ORDER — SODIUM CHLORIDE 0.9 % IV SOLN
750.0000 mg/m2/d | INTRAVENOUS | Status: DC
  Administered 2014-07-25: 7850 mg via INTRAVENOUS
  Filled 2014-07-25: qty 157

## 2014-07-25 MED ORDER — POTASSIUM CHLORIDE 2 MEQ/ML IV SOLN
Freq: Once | INTRAVENOUS | Status: AC
  Administered 2014-07-25: 10:00:00 via INTRAVENOUS
  Filled 2014-07-25: qty 10

## 2014-07-25 MED ORDER — DEXAMETHASONE SODIUM PHOSPHATE 20 MG/5ML IJ SOLN
INTRAMUSCULAR | Status: AC
  Filled 2014-07-25: qty 5

## 2014-07-25 MED ORDER — DEXAMETHASONE SODIUM PHOSPHATE 20 MG/5ML IJ SOLN
12.0000 mg | Freq: Once | INTRAMUSCULAR | Status: AC
  Administered 2014-07-25: 12 mg via INTRAVENOUS

## 2014-07-25 NOTE — Patient Instructions (Signed)
[facility] Discharge Instructions for Patients Receiving Chemotherapy  Today you received the following chemotherapy agents taxotere,cisplatinum and 5 FU infusion with home pump.  To help prevent nausea and vomiting after your treatment, we encourage you to take your nausea medication   If you develop nausea and vomiting that is not controlled by your nausea medication, call the clinic.   BELOW ARE SYMPTOMS THAT SHOULD BE REPORTED IMMEDIATELY:  *FEVER GREATER THAN 100.5 F  *CHILLS WITH OR WITHOUT FEVER  NAUSEA AND VOMITING THAT IS NOT CONTROLLED WITH YOUR NAUSEA MEDICATION  *UNUSUAL SHORTNESS OF BREATH  *UNUSUAL BRUISING OR BLEEDING  TENDERNESS IN MOUTH AND THROAT WITH OR WITHOUT PRESENCE OF ULCERS  *URINARY PROBLEMS  *BOWEL PROBLEMS  UNUSUAL RASH Items with * indicate a potential emergency and should be followed up as soon as possible.  Feel free to call the clinic you have any questions or concerns. The clinic phone number is [phone].

## 2014-07-27 ENCOUNTER — Ambulatory Visit: Payer: Medicare Other

## 2014-07-29 ENCOUNTER — Other Ambulatory Visit: Payer: Self-pay | Admitting: Oncology

## 2014-07-30 ENCOUNTER — Ambulatory Visit (HOSPITAL_BASED_OUTPATIENT_CLINIC_OR_DEPARTMENT_OTHER): Payer: Medicare Other

## 2014-07-30 ENCOUNTER — Ambulatory Visit: Payer: Medicare Other

## 2014-07-30 VITALS — BP 107/65 | HR 96 | Temp 98.3°F | Resp 18

## 2014-07-30 DIAGNOSIS — C159 Malignant neoplasm of esophagus, unspecified: Secondary | ICD-10-CM

## 2014-07-30 DIAGNOSIS — C7951 Secondary malignant neoplasm of bone: Secondary | ICD-10-CM

## 2014-07-30 DIAGNOSIS — C787 Secondary malignant neoplasm of liver and intrahepatic bile duct: Secondary | ICD-10-CM

## 2014-07-30 DIAGNOSIS — C155 Malignant neoplasm of lower third of esophagus: Secondary | ICD-10-CM

## 2014-07-30 MED ORDER — SODIUM CHLORIDE 0.9 % IJ SOLN
10.0000 mL | INTRAMUSCULAR | Status: DC | PRN
  Administered 2014-07-30: 10 mL
  Filled 2014-07-30: qty 10

## 2014-07-30 MED ORDER — PEGFILGRASTIM INJECTION 6 MG/0.6ML
6.0000 mg | Freq: Once | SUBCUTANEOUS | Status: AC
  Administered 2014-07-30: 6 mg via SUBCUTANEOUS

## 2014-07-30 MED ORDER — HEPARIN SOD (PORK) LOCK FLUSH 100 UNIT/ML IV SOLN
500.0000 [IU] | Freq: Once | INTRAVENOUS | Status: AC | PRN
  Administered 2014-07-30: 500 [IU]
  Filled 2014-07-30: qty 5

## 2014-08-01 ENCOUNTER — Telehealth: Payer: Self-pay | Admitting: *Deleted

## 2014-08-01 ENCOUNTER — Other Ambulatory Visit (HOSPITAL_BASED_OUTPATIENT_CLINIC_OR_DEPARTMENT_OTHER): Payer: Medicare Other

## 2014-08-01 DIAGNOSIS — C155 Malignant neoplasm of lower third of esophagus: Secondary | ICD-10-CM

## 2014-08-01 DIAGNOSIS — C159 Malignant neoplasm of esophagus, unspecified: Secondary | ICD-10-CM

## 2014-08-01 LAB — MAGNESIUM (CC13): Magnesium: 1.4 mg/dl — CL (ref 1.5–2.5)

## 2014-08-01 LAB — COMPREHENSIVE METABOLIC PANEL (CC13)
ALBUMIN: 2.9 g/dL — AB (ref 3.5–5.0)
ALT: 14 U/L (ref 0–55)
ANION GAP: 8 meq/L (ref 3–11)
AST: 14 U/L (ref 5–34)
Alkaline Phosphatase: 64 U/L (ref 40–150)
BUN: 18.1 mg/dL (ref 7.0–26.0)
CO2: 26 meq/L (ref 22–29)
Calcium: 8.6 mg/dL (ref 8.4–10.4)
Chloride: 104 mEq/L (ref 98–109)
Creatinine: 1 mg/dL (ref 0.7–1.3)
Glucose: 127 mg/dl (ref 70–140)
POTASSIUM: 4 meq/L (ref 3.5–5.1)
Sodium: 138 mEq/L (ref 136–145)
Total Bilirubin: 0.68 mg/dL (ref 0.20–1.20)
Total Protein: 5.8 g/dL — ABNORMAL LOW (ref 6.4–8.3)

## 2014-08-01 LAB — CBC WITH DIFFERENTIAL/PLATELET
BASO%: 2.7 % — AB (ref 0.0–2.0)
BASOS ABS: 0 10*3/uL (ref 0.0–0.1)
EOS%: 2.7 % (ref 0.0–7.0)
Eosinophils Absolute: 0 10*3/uL (ref 0.0–0.5)
HEMATOCRIT: 30.5 % — AB (ref 38.4–49.9)
HEMOGLOBIN: 9.7 g/dL — AB (ref 13.0–17.1)
LYMPH#: 0.5 10*3/uL — AB (ref 0.9–3.3)
LYMPH%: 62.2 % — ABNORMAL HIGH (ref 14.0–49.0)
MCH: 27.2 pg (ref 27.2–33.4)
MCHC: 31.8 g/dL — AB (ref 32.0–36.0)
MCV: 85.4 fL (ref 79.3–98.0)
MONO#: 0.1 10*3/uL (ref 0.1–0.9)
MONO%: 8.1 % (ref 0.0–14.0)
NEUT#: 0.2 10*3/uL — CL (ref 1.5–6.5)
NEUT%: 24.3 % — AB (ref 39.0–75.0)
Platelets: 80 10*3/uL — ABNORMAL LOW (ref 140–400)
RBC: 3.57 10*6/uL — ABNORMAL LOW (ref 4.20–5.82)
RDW: 25.1 % — AB (ref 11.0–14.6)
WBC: 0.7 10*3/uL — AB (ref 4.0–10.3)

## 2014-08-01 MED ORDER — MAGNESIUM OXIDE 400 (241.3 MG) MG PO TABS: 400.0000 mg | ORAL_TABLET | Freq: Two times a day (BID) | ORAL | Status: DC

## 2014-08-01 NOTE — Telephone Encounter (Signed)
Informed pt regarding neutropenia and hypomagnesemia.  Pt verbalized understanding regarding neutropenic pxns and starting mag oxide 400mg  BID per Dr Vista Mink.

## 2014-08-08 ENCOUNTER — Other Ambulatory Visit (HOSPITAL_BASED_OUTPATIENT_CLINIC_OR_DEPARTMENT_OTHER): Payer: Medicare Other

## 2014-08-08 DIAGNOSIS — C155 Malignant neoplasm of lower third of esophagus: Secondary | ICD-10-CM

## 2014-08-08 DIAGNOSIS — C159 Malignant neoplasm of esophagus, unspecified: Secondary | ICD-10-CM

## 2014-08-08 LAB — CBC WITH DIFFERENTIAL/PLATELET
BASO%: 0.3 % (ref 0.0–2.0)
Basophils Absolute: 0 10*3/uL (ref 0.0–0.1)
EOS%: 0 % (ref 0.0–7.0)
Eosinophils Absolute: 0 10*3/uL (ref 0.0–0.5)
HCT: 30.1 % — ABNORMAL LOW (ref 38.4–49.9)
HGB: 9.3 g/dL — ABNORMAL LOW (ref 13.0–17.1)
LYMPH%: 6 % — AB (ref 14.0–49.0)
MCH: 27 pg — AB (ref 27.2–33.4)
MCHC: 30.9 g/dL — AB (ref 32.0–36.0)
MCV: 87.3 fL (ref 79.3–98.0)
MONO#: 1.2 10*3/uL — ABNORMAL HIGH (ref 0.1–0.9)
MONO%: 8.9 % (ref 0.0–14.0)
NEUT#: 11.2 10*3/uL — ABNORMAL HIGH (ref 1.5–6.5)
NEUT%: 84.8 % — ABNORMAL HIGH (ref 39.0–75.0)
PLATELETS: 106 10*3/uL — AB (ref 140–400)
RBC: 3.44 10*6/uL — AB (ref 4.20–5.82)
RDW: 27.7 % — AB (ref 11.0–14.6)
WBC: 13.2 10*3/uL — ABNORMAL HIGH (ref 4.0–10.3)
lymph#: 0.8 10*3/uL — ABNORMAL LOW (ref 0.9–3.3)

## 2014-08-08 LAB — COMPREHENSIVE METABOLIC PANEL (CC13)
ALT: 10 U/L (ref 0–55)
AST: 14 U/L (ref 5–34)
Albumin: 2.9 g/dL — ABNORMAL LOW (ref 3.5–5.0)
Alkaline Phosphatase: 92 U/L (ref 40–150)
Anion Gap: 7 mEq/L (ref 3–11)
BUN: 11.1 mg/dL (ref 7.0–26.0)
CO2: 27 mEq/L (ref 22–29)
CREATININE: 1 mg/dL (ref 0.7–1.3)
Calcium: 8.8 mg/dL (ref 8.4–10.4)
Chloride: 105 mEq/L (ref 98–109)
Glucose: 114 mg/dl (ref 70–140)
Potassium: 3.8 mEq/L (ref 3.5–5.1)
Sodium: 139 mEq/L (ref 136–145)
Total Bilirubin: 0.43 mg/dL (ref 0.20–1.20)
Total Protein: 5.6 g/dL — ABNORMAL LOW (ref 6.4–8.3)

## 2014-08-08 LAB — MAGNESIUM (CC13): Magnesium: 1.5 mg/dl (ref 1.5–2.5)

## 2014-08-15 ENCOUNTER — Telehealth: Payer: Self-pay | Admitting: Internal Medicine

## 2014-08-15 ENCOUNTER — Encounter: Payer: Self-pay | Admitting: Internal Medicine

## 2014-08-15 ENCOUNTER — Other Ambulatory Visit (HOSPITAL_BASED_OUTPATIENT_CLINIC_OR_DEPARTMENT_OTHER): Payer: Medicare Other

## 2014-08-15 ENCOUNTER — Ambulatory Visit (HOSPITAL_BASED_OUTPATIENT_CLINIC_OR_DEPARTMENT_OTHER): Payer: Medicare Other | Admitting: Internal Medicine

## 2014-08-15 ENCOUNTER — Ambulatory Visit: Payer: Medicare Other

## 2014-08-15 ENCOUNTER — Ambulatory Visit (HOSPITAL_BASED_OUTPATIENT_CLINIC_OR_DEPARTMENT_OTHER): Payer: Medicare Other

## 2014-08-15 VITALS — BP 131/66 | HR 87 | Temp 98.4°F | Resp 19 | Ht 67.0 in | Wt 202.4 lb

## 2014-08-15 DIAGNOSIS — C155 Malignant neoplasm of lower third of esophagus: Secondary | ICD-10-CM

## 2014-08-15 DIAGNOSIS — C159 Malignant neoplasm of esophagus, unspecified: Secondary | ICD-10-CM

## 2014-08-15 DIAGNOSIS — Z5111 Encounter for antineoplastic chemotherapy: Secondary | ICD-10-CM

## 2014-08-15 LAB — CBC WITH DIFFERENTIAL/PLATELET
BASO%: 0.3 % (ref 0.0–2.0)
BASOS ABS: 0 10*3/uL (ref 0.0–0.1)
EOS%: 0 % (ref 0.0–7.0)
Eosinophils Absolute: 0 10*3/uL (ref 0.0–0.5)
HCT: 30.4 % — ABNORMAL LOW (ref 38.4–49.9)
HEMOGLOBIN: 9.7 g/dL — AB (ref 13.0–17.1)
LYMPH%: 8 % — ABNORMAL LOW (ref 14.0–49.0)
MCH: 28.7 pg (ref 27.2–33.4)
MCHC: 32.1 g/dL (ref 32.0–36.0)
MCV: 89.6 fL (ref 79.3–98.0)
MONO#: 0.9 10*3/uL (ref 0.1–0.9)
MONO%: 9 % (ref 0.0–14.0)
NEUT#: 8.6 10*3/uL — ABNORMAL HIGH (ref 1.5–6.5)
NEUT%: 82.7 % — ABNORMAL HIGH (ref 39.0–75.0)
Platelets: 277 10*3/uL (ref 140–400)
RBC: 3.39 10*6/uL — ABNORMAL LOW (ref 4.20–5.82)
RDW: 27.3 % — AB (ref 11.0–14.6)
WBC: 10.4 10*3/uL — ABNORMAL HIGH (ref 4.0–10.3)
lymph#: 0.8 10*3/uL — ABNORMAL LOW (ref 0.9–3.3)

## 2014-08-15 LAB — COMPREHENSIVE METABOLIC PANEL (CC13)
ALBUMIN: 2.8 g/dL — AB (ref 3.5–5.0)
ALK PHOS: 81 U/L (ref 40–150)
ALT: 9 U/L (ref 0–55)
ANION GAP: 7 meq/L (ref 3–11)
AST: 13 U/L (ref 5–34)
BUN: 13.1 mg/dL (ref 7.0–26.0)
CO2: 24 meq/L (ref 22–29)
Calcium: 9 mg/dL (ref 8.4–10.4)
Chloride: 105 mEq/L (ref 98–109)
Creatinine: 1 mg/dL (ref 0.7–1.3)
GLUCOSE: 122 mg/dL (ref 70–140)
POTASSIUM: 4.1 meq/L (ref 3.5–5.1)
Sodium: 137 mEq/L (ref 136–145)
Total Bilirubin: 0.39 mg/dL (ref 0.20–1.20)
Total Protein: 5.9 g/dL — ABNORMAL LOW (ref 6.4–8.3)

## 2014-08-15 LAB — MAGNESIUM (CC13): MAGNESIUM: 1.8 mg/dL (ref 1.5–2.5)

## 2014-08-15 MED ORDER — DEXAMETHASONE SODIUM PHOSPHATE 20 MG/5ML IJ SOLN
12.0000 mg | Freq: Once | INTRAMUSCULAR | Status: AC
  Administered 2014-08-15: 12 mg via INTRAVENOUS

## 2014-08-15 MED ORDER — SODIUM CHLORIDE 0.9 % IV SOLN
75.0000 mg/m2 | Freq: Once | INTRAVENOUS | Status: AC
  Administered 2014-08-15: 157 mg via INTRAVENOUS
  Filled 2014-08-15: qty 157

## 2014-08-15 MED ORDER — SODIUM CHLORIDE 0.9 % IJ SOLN
10.0000 mL | INTRAMUSCULAR | Status: DC | PRN
  Filled 2014-08-15: qty 10

## 2014-08-15 MED ORDER — DEXAMETHASONE SODIUM PHOSPHATE 20 MG/5ML IJ SOLN
INTRAMUSCULAR | Status: AC
  Filled 2014-08-15: qty 5

## 2014-08-15 MED ORDER — PALONOSETRON HCL INJECTION 0.25 MG/5ML
INTRAVENOUS | Status: AC
  Filled 2014-08-15: qty 5

## 2014-08-15 MED ORDER — HEPARIN SOD (PORK) LOCK FLUSH 100 UNIT/ML IV SOLN
500.0000 [IU] | Freq: Once | INTRAVENOUS | Status: DC | PRN
  Filled 2014-08-15: qty 5

## 2014-08-15 MED ORDER — DOCETAXEL CHEMO INJECTION 160 MG/16ML
75.0000 mg/m2 | Freq: Once | INTRAVENOUS | Status: AC
  Administered 2014-08-15: 160 mg via INTRAVENOUS
  Filled 2014-08-15: qty 16

## 2014-08-15 MED ORDER — SODIUM CHLORIDE 0.9 % IV SOLN
750.0000 mg/m2/d | INTRAVENOUS | Status: DC
  Administered 2014-08-15: 7850 mg via INTRAVENOUS
  Filled 2014-08-15: qty 157

## 2014-08-15 MED ORDER — PALONOSETRON HCL INJECTION 0.25 MG/5ML
0.2500 mg | Freq: Once | INTRAVENOUS | Status: AC
  Administered 2014-08-15: 0.25 mg via INTRAVENOUS

## 2014-08-15 MED ORDER — FOSAPREPITANT DIMEGLUMINE INJECTION 150 MG
150.0000 mg | Freq: Once | INTRAVENOUS | Status: AC
  Administered 2014-08-15: 150 mg via INTRAVENOUS
  Filled 2014-08-15: qty 5

## 2014-08-15 MED ORDER — POTASSIUM CHLORIDE 2 MEQ/ML IV SOLN
Freq: Once | INTRAVENOUS | Status: AC
  Administered 2014-08-15: 10:00:00 via INTRAVENOUS
  Filled 2014-08-15: qty 10

## 2014-08-15 MED ORDER — SODIUM CHLORIDE 0.9 % IV SOLN
Freq: Once | INTRAVENOUS | Status: AC
  Administered 2014-08-15: 09:00:00 via INTRAVENOUS

## 2014-08-15 NOTE — Telephone Encounter (Signed)
gv adn printed appt sched adn avs for pt for NOV and DEC....sed added tx.

## 2014-08-15 NOTE — Patient Instructions (Signed)
[facility] Discharge Instructions for Patients Receiving Chemotherapy  Today you received the following chemotherapy agents: Taxotere, Cisplatin and Fluorouracil.  To help prevent nausea and vomiting after your treatment, we encourage you to take your nausea medication, Compazine. Take one every 6 hours as needed.   If you develop nausea and vomiting that is not controlled by your nausea medication, call the clinic.   BELOW ARE SYMPTOMS THAT SHOULD BE REPORTED IMMEDIATELY:  *FEVER GREATER THAN 100.5 F  *CHILLS WITH OR WITHOUT FEVER  NAUSEA AND VOMITING THAT IS NOT CONTROLLED WITH YOUR NAUSEA MEDICATION  *UNUSUAL SHORTNESS OF BREATH  *UNUSUAL BRUISING OR BLEEDING  TENDERNESS IN MOUTH AND THROAT WITH OR WITHOUT PRESENCE OF ULCERS  *URINARY PROBLEMS  *BOWEL PROBLEMS  UNUSUAL RASH Items with * indicate a potential emergency and should be followed up as soon as possible.  Feel free to call the clinic should you have any questions or concerns. The clinic phone number is [phone].

## 2014-08-15 NOTE — Progress Notes (Signed)
[facility] [phone]   [phone] [place], [place] [address]  DIAGNOSIS: Metastatic esophageal adenocarcinoma initially diagnosed as Locally advanced, stage IIIB (T3, N2, M0) distal esophageal adenocarcinoma diagnosed in May of 2015  PRIOR THERAPY: Concurrent chemoradiation with weekly carboplatin for AUC of 2 and paclitaxel 45 mg/M2 for 5 weeks. Last dose was given on 03/29/1999  CURRENT THERAPY: Systemic chemotherapy with cisplatin 75 mg/M2, docetaxel 75 mg/M2 on day 1 and 5-FU 750 mg/M2 continuous infusion for 5 days. For a cycle on 05/23/2014. Status post 4 cycles.  INTERVAL HISTORY: [name] [name] 68 y.o. male returns to the clinic today for followup visit. He is tolerating his current treatment with cisplatin, docetaxel and continuous infusion 5-FU fairly well with no significant adverse effect except for lack of taste and mild fatigue. His last CT scan of the chest, abdomen and pelvis showed no evidence for disease progression and the patient continued his treatment with the same regimen. The patient denied having any significant weight loss or night sweats. He has no nausea or vomiting. He denied having any significant fever or chills. He has no chest pain or hemoptysis. He denied having any significant nausea or vomiting but continues to have mild odynophagia with no change in his bowel movement. He is here today to start cycle #5 of his chemotherapy.   MEDICAL HISTORY: Past Medical History  Diagnosis Date  . GERD (gastroesophageal reflux disease)   . Hyperlipemia   . Barrett's esophagus 12/2013  . History of radiation therapy 02/23/14- 04/04/14    esophagus 5040 cGy 28 sessions  . 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 Hypertension     ALLERGIES:  has No Known Allergies.  MEDICATIONS:  Current Outpatient Prescriptions  Medication Sig Dispense Refill  .  cloNIDine (CATAPRES) 0.1 MG tablet Take 0.1 mg by mouth daily as needed (will take with diovan hct if blood pressure is greater than 130/80).     Marland Kitchen dexamethasone (DECADRON) 4 MG tablet 2 tab po bid the day before, day of and day after chemo every 3 weeks. 40 tablet 1  . dutasteride (AVODART) 0.5 MG capsule Take 0.5 mg by mouth every evening.     . Garlic 5056 MG CAPS Take 1 capsule by mouth daily.    Marland Kitchen lidocaine-prilocaine (EMLA) cream Apply 1 application topically as needed. 30 g 0  . magnesium hydroxide (MILK OF MAGNESIA) 400 MG/5ML suspension Take 5 mLs by mouth daily as needed for mild constipation.    . magnesium oxide (MAG-OX) 400 (241.3 MG) MG tablet Take 1 tablet (400 mg total) by mouth 2 (two) times daily. 60 tablet 0  . Omega-3 Fatty Acids (FISH OIL) 1000 MG CAPS Take 1,000 mg by mouth daily.    Marland Kitchen omeprazole (PRILOSEC) 40 MG capsule Take 40 mg by mouth daily.    . simvastatin (ZOCOR) 40 MG tablet Take 40 mg by mouth daily.     . valsartan-hydrochlorothiazide (DIOVAN-HCT) 160-25 MG per tablet Take 1 tablet by mouth daily as needed (If blood pressure is greater than 130/80).     . vitamin E (VITAMIN E) 1000 UNIT capsule Take 1,000 Units by mouth daily.    . prochlorperazine (COMPAZINE) 10 MG tablet Take 1 tablet (10 mg total) by mouth every 6 (six) hours as needed for nausea or vomiting. 30 tablet 1   No current facility-administered medications for this  visit.   Facility-Administered Medications Ordered in Other Visits  Medication Dose Route Frequency Provider Last Rate Last Dose  . sodium chloride 0.9 % injection 10 mL  10 mL Intracatheter PRN Curt Bears, MD   10 mL at 05/28/14 1235    SURGICAL HISTORY:  Past Surgical History  Procedure Laterality Date  . Eus N/A 02/03/2014    Procedure: UPPER ENDOSCOPIC ULTRASOUND (EUS) LINEAR;  Surgeon: Milus Banister, MD;  Location: [facility];  Service: Endoscopy;  Laterality: N/A;    REVIEW OF SYSTEMS:  Constitutional: positive for  fatigue Eyes: negative Ears, nose, mouth, throat, and face: negative Respiratory: positive for dyspnea on exertion Cardiovascular: negative Gastrointestinal: positive for odynophagia Genitourinary:negative Integument/breast: negative Hematologic/lymphatic: negative Musculoskeletal:negative Neurological: negative Behavioral/Psych: negative Endocrine: negative Allergic/Immunologic: negative   PHYSICAL EXAMINATION: General appearance: alert, cooperative, fatigued and no distress Head: Normocephalic, without obvious abnormality, atraumatic Neck: no adenopathy, no JVD, supple, symmetrical, trachea midline and thyroid not enlarged, symmetric, no tenderness/mass/nodules Lymph nodes: Cervical, supraclavicular, and axillary nodes normal. Resp: clear to auscultation bilaterally Back: symmetric, no curvature. ROM normal. No CVA tenderness. Cardio: regular rate and rhythm, S1, S2 normal, no murmur, click, rub or gallop GI: soft, non-tender; bowel sounds normal; no masses,  no organomegaly Extremities: extremities normal, atraumatic, no cyanosis or edema Neurologic: Alert and oriented X 3, normal strength and tone. Normal symmetric reflexes. Normal coordination and gait  ECOG PERFORMANCE STATUS: 1 - Symptomatic but completely ambulatory  Blood pressure 131/66, pulse 87, temperature 98.4 F (36.9 C), temperature source Oral, resp. rate 19, height 5\' 7"  (1.702 m), weight 202 lb 6.4 oz (91.808 kg), SpO2 100 %.  LABORATORY DATA: Lab Results  Component Value Date   WBC 10.4* 08/15/2014   HGB 9.7* 08/15/2014   HCT 30.4* 08/15/2014   MCV 89.6 08/15/2014   PLT 277 08/15/2014      Chemistry      Component Value Date/Time   NA 139 08/08/2014 1109   NA 138 01/26/2014 1148   K 3.8 08/08/2014 1109   K 3.9 01/26/2014 1148   CL 104 01/26/2014 1148   CO2 27 08/08/2014 1109   CO2 29 01/26/2014 1148   BUN 11.1 08/08/2014 1109   BUN 11 01/26/2014 1148   CREATININE 1.0 08/08/2014 1109    CREATININE 1.0 01/26/2014 1148      Component Value Date/Time   CALCIUM 8.8 08/08/2014 1109   CALCIUM 8.8 01/26/2014 1148   ALKPHOS 92 08/08/2014 1109   ALKPHOS 62 01/26/2014 1148   AST 14 08/08/2014 1109   AST 12 01/26/2014 1148   ALT 10 08/08/2014 1109   ALT 9 01/26/2014 1148   BILITOT 0.43 08/08/2014 1109   BILITOT 0.6 01/26/2014 1148       RADIOGRAPHIC STUDIES: Ct Chest W Contrast  07/18/2014   CLINICAL DATA:  Malignant neoplasm of the lower third of the esophagus, restaging. Barrett's esophagus.  EXAM: CT CHEST, ABDOMEN WITH CONTRAST  TECHNIQUE: Multidetector CT imaging of the chest, abdomen was performed following the standard protocol during bolus administration of intravenous contrast.  CONTRAST:  26mL OMNIPAQUE IOHEXOL 300 MG/ML SOLN, 150mL OMNIPAQUE IOHEXOL 300 MG/ML SOLN  COMPARISON:  Multiple exams, including 05/16/2014 and 01/28/2014  FINDINGS: CT CHEST FINDINGS  Dilated esophagus with air-fluid level. Wall thickening in the distal esophagus circumferentially, possibly with anterior ulceration. This is similar to prior PET-CT, but the degree of esophageal wall thickening is markedly improved compared to 01/28/2014. Subcarinal lymph node 1.0 cm in short axis on image 32 series  2.  Trace bilateral pleural effusions with mild passive atelectasis.  Centrilobular emphysema. Old granulomatous disease versus broad collapse especially in the lower lobes with bibasilar bronchiectasis and fibrosis.  Sclerotic osseous metastatic disease including the right first and fourth ribs new. The amount of right fourth rib sclerosis is increased.  CT ABDOMEN FINDINGS  Hepatobiliary: Prior area of concern in the lateral segment left hepatic lobe demonstrates only equivocal hypodensity on today's exam, along the posterior inferior margin of segment 3.  Pancreas: Punctate calcifications superiorly along the pancreatic body. Stable.  Spleen: Unremarkable  Adrenals/Urinary Tract: Stable tiny hypodense lesions  in both kidneys technically too small to characterize although statistically likely to be cysts, essentially stable from 01/28/2014.  Stomach/Bowel: Unremarkable  Vascular/Lymphatic: A prior 1.6 cm gastrohepatic ligament lymph node is no longer well seen. Small retrocaval lymph node on image 63 of series 2 measures 0.8 cm in short axis, image 63 of series 2, stable. 9 mm lymph node adjacent to the left adrenal gland on that same image. Aortoiliac atherosclerotic vascular disease.  Musculoskeletal: Unremarkable  IMPRESSION: 1. Mild wall thickening and possible anterior ulceration in the distal esophageal wall, although still greatly improved compared to 01/28/14. 2. The prior area of concern for malignancy in the lateral segment left hepatic lobe is only faintly hypodense on today's exam, without a well-defined focal mass. Continued attention to this region would be suggested given the findings on PET-CT. 3. Resolution of prior 1.6 cm gastrohepatic ligament lymph node. 4. Dilated esophagus with air-fluid level. 5. Centrilobular emphysema. 6. Sclerotic osseous metastatic disease including the right fourth rib and possibly the right first rib. The amount of sclerosis in the fourth rib is increased, but this may simply be from successful treatment and does not necessarily indicate progression of disease. 7. Trace bilateral pleural effusions with passive atelectasis.   Electronically Signed   By: Sherryl Barters M.D.   On: 07/18/2014 11:52   Ct Abdomen W Contrast  07/18/2014   CLINICAL DATA:  Malignant neoplasm of the lower third of the esophagus, restaging. Barrett's esophagus.  EXAM: CT CHEST, ABDOMEN WITH CONTRAST  TECHNIQUE: Multidetector CT imaging of the chest, abdomen was performed following the standard protocol during bolus administration of intravenous contrast.  CONTRAST:  79mL OMNIPAQUE IOHEXOL 300 MG/ML SOLN, 116mL OMNIPAQUE IOHEXOL 300 MG/ML SOLN  COMPARISON:  Multiple exams, including 05/16/2014 and  01/28/2014  FINDINGS: CT CHEST FINDINGS  Dilated esophagus with air-fluid level. Wall thickening in the distal esophagus circumferentially, possibly with anterior ulceration. This is similar to prior PET-CT, but the degree of esophageal wall thickening is markedly improved compared to 01/28/2014. Subcarinal lymph node 1.0 cm in short axis on image 32 series 2.  Trace bilateral pleural effusions with mild passive atelectasis.  Centrilobular emphysema. Old granulomatous disease versus broad collapse especially in the lower lobes with bibasilar bronchiectasis and fibrosis.  Sclerotic osseous metastatic disease including the right first and fourth ribs new. The amount of right fourth rib sclerosis is increased.  CT ABDOMEN FINDINGS  Hepatobiliary: Prior area of concern in the lateral segment left hepatic lobe demonstrates only equivocal hypodensity on today's exam, along the posterior inferior margin of segment 3.  Pancreas: Punctate calcifications superiorly along the pancreatic body. Stable.  Spleen: Unremarkable  Adrenals/Urinary Tract: Stable tiny hypodense lesions in both kidneys technically too small to characterize although statistically likely to be cysts, essentially stable from 01/28/2014.  Stomach/Bowel: Unremarkable  Vascular/Lymphatic: A prior 1.6 cm gastrohepatic ligament lymph node is no longer well seen.  Small retrocaval lymph node on image 63 of series 2 measures 0.8 cm in short axis, image 63 of series 2, stable. 9 mm lymph node adjacent to the left adrenal gland on that same image. Aortoiliac atherosclerotic vascular disease.  Musculoskeletal: Unremarkable  IMPRESSION: 1. Mild wall thickening and possible anterior ulceration in the distal esophageal wall, although still greatly improved compared to 01/28/14. 2. The prior area of concern for malignancy in the lateral segment left hepatic lobe is only faintly hypodense on today's exam, without a well-defined focal mass. Continued attention to this region  would be suggested given the findings on PET-CT. 3. Resolution of prior 1.6 cm gastrohepatic ligament lymph node. 4. Dilated esophagus with air-fluid level. 5. Centrilobular emphysema. 6. Sclerotic osseous metastatic disease including the right fourth rib and possibly the right first rib. The amount of sclerosis in the fourth rib is increased, but this may simply be from successful treatment and does not necessarily indicate progression of disease. 7. Trace bilateral pleural effusions with passive atelectasis.   Electronically Signed   By: Sherryl Barters M.D.   On: 07/18/2014 11:52   ASSESSMENT AND PLAN: This is a very pleasant 68 years old [place] American male with metastatic esophageal adenocarcinoma initially diagnosed as locally advanced disease status post a course of concurrent chemoradiation with weekly carboplatin and paclitaxel.  He is currently undergoing systemic chemotherapy with cisplatin, docetaxel and continuous infusion 5-FU status post 4 cycles and tolerating his treatment fairly well. There is no evidence for disease progression on the recent scan. I recommended for the patient to continue his current treatment with the same regimen for now. He will start cycle #5 today. The patient would come back for follow-up visit in 3 weeks with the next cycle of his chemotherapy. He was advised to call immediately if he has any concerning symptoms in the interval. All questions were answered. The patient knows to call the clinic with any problems, questions or concerns. We can certainly see the patient much sooner if necessary.  Disclaimer: This note was dictated with voice recognition software. Similar sounding words can inadvertently be transcribed and may not be corrected upon review.

## 2014-08-18 ENCOUNTER — Ambulatory Visit (INDEPENDENT_AMBULATORY_CARE_PROVIDER_SITE_OTHER): Payer: Medicare Other | Admitting: Internal Medicine

## 2014-08-18 ENCOUNTER — Encounter: Payer: Self-pay | Admitting: Internal Medicine

## 2014-08-18 VITALS — BP 110/62 | HR 100 | Ht 67.0 in | Wt 208.0 lb

## 2014-08-18 DIAGNOSIS — J432 Centrilobular emphysema: Secondary | ICD-10-CM

## 2014-08-18 DIAGNOSIS — R0600 Dyspnea, unspecified: Secondary | ICD-10-CM

## 2014-08-18 DIAGNOSIS — J849 Interstitial pulmonary disease, unspecified: Secondary | ICD-10-CM

## 2014-08-18 MED ORDER — TIOTROPIUM BROMIDE MONOHYDRATE 18 MCG IN CAPS: 18.0000 ug | ORAL_CAPSULE | Freq: Every day | RESPIRATORY_TRACT | Status: DC

## 2014-08-18 NOTE — Progress Notes (Signed)
Subjective:    Patient ID: [name] [name], male    DOB: October 30, 1945, 68 y.o.   MRN: [id number]  HPI     OV 08/18/2014  Chief Complaint  Patient presents with  . Follow-up    Pt states his breathing is unchanged since last OV.Pt stated he has been using nocturnal O2 but has not worn O2 for the last 3 nights. Pt c/o dry cough. Pt denies CP/tightness.    Follow-up interstitial lung disease not otherwise specified in the setting of progressive metastatic esophageal adenocarcinoma. I last saw him in August 2015 and we deferred surgical lung biopsy on account of his progressive cancer. He is currently undergoing systemic chemotherapy with cisplatin, docetaxel and continuous infusion of 5-fluorouracil. He had his fifth cycle of chemotherapy on November 6 in 2015. Since I last saw him in August 2015 he says that his dyspnea is somewhat better and is only mild with exertion. Cough is rated as mild and present only occasionally. I had advised him nocturnal oxygen but he says that he uses it only sparingly. He says that in fact he sleeps better without it. His latest CT scan of the chest 07/18/2014 still shows persistent basal changes suggestive of interstitial lung disease along with emphysema. Unchanged from May 2015   reports that he quit smoking about 25 years ago. His smoking use included Cigarettes. He has a 70 pack-year smoking history. He has never used smokeless tobacco.  Past, Family, Social reviewed: no change since last visit   Review of Systems  Constitutional: Negative for fever and unexpected weight change.  HENT: Negative for congestion, dental problem, ear pain, nosebleeds, postnasal drip, rhinorrhea, sinus pressure, sneezing, sore throat and trouble swallowing.   Eyes: Negative for redness and itching.  Respiratory: Positive for cough and shortness of breath. Negative for chest tightness and wheezing.   Cardiovascular: Positive for leg swelling. Negative for palpitations.    Gastrointestinal: Negative for nausea and vomiting.  Genitourinary: Negative for dysuria.  Musculoskeletal: Negative for joint swelling.  Skin: Negative for rash.  Neurological: Negative for headaches.  Hematological: Does not bruise/bleed easily.  Psychiatric/Behavioral: Negative for dysphoric mood. The patient is not nervous/anxious.     Current outpatient prescriptions: cloNIDine (CATAPRES) 0.1 MG tablet, Take 0.1 mg by mouth daily as needed (will take with diovan hct if blood pressure is greater than 130/80). , Disp: , Rfl: ;  dexamethasone (DECADRON) 4 MG tablet, 2 tab po bid the day before, day of and day after chemo every 3 weeks., Disp: 40 tablet, Rfl: 1;  dutasteride (AVODART) 0.5 MG capsule, Take 0.5 mg by mouth every evening. , Disp: , Rfl:  lidocaine-prilocaine (EMLA) cream, Apply 1 application topically as needed., Disp: 30 g, Rfl: 0;  magnesium hydroxide (MILK OF MAGNESIA) 400 MG/5ML suspension, Take 5 mLs by mouth daily as needed for mild constipation., Disp: , Rfl: ;  magnesium oxide (MAG-OX) 400 (241.3 MG) MG tablet, Take 1 tablet (400 mg total) by mouth 2 (two) times daily., Disp: 60 tablet, Rfl: 0 Omega-3 Fatty Acids (FISH OIL) 1000 MG CAPS, Take 1,000 mg by mouth daily., Disp: , Rfl: ;  omeprazole (PRILOSEC) 40 MG capsule, Take 40 mg by mouth daily., Disp: , Rfl: ;  prochlorperazine (COMPAZINE) 10 MG tablet, Take 1 tablet (10 mg total) by mouth every 6 (six) hours as needed for nausea or vomiting., Disp: 30 tablet, Rfl: 1;  simvastatin (ZOCOR) 40 MG tablet, Take 40 mg by mouth daily. , Disp: , Rfl:  vitamin  E (VITAMIN E) 1000 UNIT capsule, Take 1,000 Units by mouth daily., Disp: , Rfl: ;  Garlic 6269 MG CAPS, Take 1 capsule by mouth daily., Disp: , Rfl: ;  valsartan-hydrochlorothiazide (DIOVAN-HCT) 160-25 MG per tablet, Take 1 tablet by mouth daily as needed (If blood pressure is greater than 130/80). , Disp: , Rfl:  No current facility-administered medications for this  visit. Facility-Administered Medications Ordered in Other Visits: sodium chloride 0.9 % injection 10 mL, 10 mL, Intracatheter, PRN, Curt Bears, MD, 10 mL at 05/28/14 1235      Objective:   Physical Exam  Filed Vitals:   08/18/14 1334  BP: 110/62  Pulse: 100  Height: 5\' 7"  (1.702 m)  Weight: 208 lb (94.348 kg)  SpO2: 98%    rsing note and vitals reviewed. Constitutional: He is oriented to person, place, and time. He appears well-developed and well-nourished. No distress.  HENT:  Head: Normocephalic and atraumatic.  Right Ear: External ear normal.  Left Ear: External ear normal.  Mouth/Throat: Oropharynx is clear and moist. No oropharyngeal exudate.  Eyes: Conjunctivae and EOM are normal. Pupils are equal, round, and reactive to light. Right eye exhibits no discharge. Left eye exhibits no discharge. No scleral icterus.  Neck: Normal range of motion. Neck supple. No JVD present. No tracheal deviation present. No thyromegaly present.  Cardiovascular: Normal rate, regular rhythm and intact distal pulses.  Exam reveals no gallop and no friction rub.   No murmur heard. Pulmonary/Chest: Effort normal. No respiratory distress. He has no wheezes. He has rales. He exhibits no tenderness. Darklk skin and the back consistent with radiation effect  Mild basal rales  Abdominal: Soft. Bowel sounds are normal. He exhibits no distension and no mass. There is no tenderness. There is no rebound and no guarding.  Musculoskeletal: Normal range of motion. He exhibits no edema and no tenderness.  Clubbing + along with tight skin on hand but gives history of frost bite Lymphadenopathy:    He has no cervical adenopathy.  Neurological: He is alert and oriented to person, place, and time. He has normal reflexes. No cranial nerve deficit. Coordination normal.  Skin: Skin is warm and dry. No rash noted. He is not diaphoretic. No erythema. No pallor.  Psychiatric: He has a normal mood and affect. His  behavior is normal. Judgment and thought content normal.       Assessment & Plan:     ICD-9-CM ICD-10-CM   1. Dyspnea 786.09 R06.00   2. ILD (interstitial lung disease) 515 J84.9   3. Centrilobular emphysema 492.8 J43.2     He has mild dyspnea and cough due to basal interstitial lung disease not otherwise certified. He also has emphysema on CT scan of the chest from prior smoking. Symptom level is only mild. He is not a candidate for surgical lung biopsy on account of his progressive malignancy. However I will treat his symptoms. I will start him on Spiriva 1 puff daily and reassess. Given his palliative mode I would just encourage him to use his oxygen but left it at his discretion   We'll see him back in 8 months Off note he refuses flu shot   Dr. Brand Males, M.D., [facility].C.P Pulmonary and Critical Care Medicine Staff Physician [facility] Pager: [phone], If no answer or between  15:00h - 7:00h: call [phone]  08/18/2014 1:49 PM

## 2014-08-18 NOTE — Patient Instructions (Addendum)
#  ILD and emphysema  - stable  - Please start spiriva 1 puff daily - take , script and show technique  #[id number]  8 molnths or sooner if needed

## 2014-08-18 NOTE — Addendum Note (Signed)
Addended by: Maurice March on: 08/18/2014 01:51 PM   Modules accepted: Orders

## 2014-08-20 ENCOUNTER — Ambulatory Visit (HOSPITAL_BASED_OUTPATIENT_CLINIC_OR_DEPARTMENT_OTHER): Payer: Medicare Other

## 2014-08-20 ENCOUNTER — Ambulatory Visit: Payer: Medicare Other

## 2014-08-20 DIAGNOSIS — C155 Malignant neoplasm of lower third of esophagus: Secondary | ICD-10-CM

## 2014-08-20 DIAGNOSIS — Z5189 Encounter for other specified aftercare: Secondary | ICD-10-CM

## 2014-08-20 DIAGNOSIS — C159 Malignant neoplasm of esophagus, unspecified: Secondary | ICD-10-CM

## 2014-08-20 MED ORDER — PEGFILGRASTIM INJECTION 6 MG/0.6ML
6.0000 mg | Freq: Once | SUBCUTANEOUS | Status: AC
  Administered 2014-08-20: 6 mg via SUBCUTANEOUS

## 2014-08-20 MED ORDER — HEPARIN SOD (PORK) LOCK FLUSH 100 UNIT/ML IV SOLN
500.0000 [IU] | Freq: Once | INTRAVENOUS | Status: AC | PRN
  Administered 2014-08-20: 500 [IU]
  Filled 2014-08-20: qty 5

## 2014-08-20 MED ORDER — SODIUM CHLORIDE 0.9 % IJ SOLN
10.0000 mL | INTRAMUSCULAR | Status: DC | PRN
  Administered 2014-08-20: 10 mL
  Filled 2014-08-20: qty 10

## 2014-08-20 NOTE — Progress Notes (Signed)
Reports slight oral discomfort.  Will use baking soda, salt to gargle and using alcohol free mouthwash.

## 2014-08-20 NOTE — Patient Instructions (Signed)
Pegfilgrastim injection What is this medicine? PEGFILGRASTIM (peg fil GRA stim) is a long-acting granulocyte colony-stimulating factor that stimulates the growth of neutrophils, a type of white blood cell important in the body's fight against infection. It is used to reduce the incidence of fever and infection in patients with certain types of cancer who are receiving chemotherapy that affects the bone marrow. This medicine may be used for other purposes; ask your health care provider or pharmacist if you have questions. COMMON BRAND NAME(S): Neulasta What should I tell my health care provider before I take this medicine? They need to know if you have any of these conditions: -latex allergy -ongoing radiation therapy -sickle cell disease -skin reactions to acrylic adhesives (On-Body Injector only) -an unusual or allergic reaction to pegfilgrastim, filgrastim, other medicines, foods, dyes, or preservatives -pregnant or trying to get pregnant -breast-feeding How should I use this medicine? This medicine is for injection under the skin. If you get this medicine at home, you will be taught how to prepare and give the pre-filled syringe or how to use the On-body Injector. Refer to the patient Instructions for Use for detailed instructions. Use exactly as directed. Take your medicine at regular intervals. Do not take your medicine more often than directed. It is important that you put your used needles and syringes in a special sharps container. Do not put them in a trash can. If you do not have a sharps container, call your pharmacist or healthcare provider to get one. Talk to your pediatrician regarding the use of this medicine in children. Special care may be needed. Overdosage: If you think you have taken too much of this medicine contact a poison control center or emergency room at once. NOTE: This medicine is only for you. Do not share this medicine with others. What if I miss a dose? It is  important not to miss your dose. Call your doctor or health care professional if you miss your dose. If you miss a dose due to an On-body Injector failure or leakage, a new dose should be administered as soon as possible using a single prefilled syringe for manual use. What may interact with this medicine? Interactions have not been studied. Give your health care provider a list of all the medicines, herbs, non-prescription drugs, or dietary supplements you use. Also tell them if you smoke, drink alcohol, or use illegal drugs. Some items may interact with your medicine. This list may not describe all possible interactions. Give your health care provider a list of all the medicines, herbs, non-prescription drugs, or dietary supplements you use. Also tell them if you smoke, drink alcohol, or use illegal drugs. Some items may interact with your medicine. What should I watch for while using this medicine? You may need blood work done while you are taking this medicine. If you are going to need a MRI, CT scan, or other procedure, tell your doctor that you are using this medicine (On-Body Injector only). What side effects may I notice from receiving this medicine? Side effects that you should report to your doctor or health care professional as soon as possible: -allergic reactions like skin rash, itching or hives, swelling of the face, lips, or tongue -dizziness -fever -pain, redness, or irritation at site where injected -pinpoint red spots on the skin -shortness of breath or breathing problems -stomach or side pain, or pain at the shoulder -swelling -tiredness -trouble passing urine Side effects that usually do not require medical attention (report to your doctor   or health care professional if they continue or are bothersome): -bone pain -muscle pain This list may not describe all possible side effects. Call your doctor for medical advice about side effects. You may report side effects to FDA at  1-800-FDA-1088. Where should I keep my medicine? Keep out of the reach of children. Store pre-filled syringes in a refrigerator between 2 and 8 degrees C (36 and 46 degrees F). Do not freeze. Keep in carton to protect from light. Throw away this medicine if it is left out of the refrigerator for more than 48 hours. Throw away any unused medicine after the expiration date. NOTE: This sheet is a summary. It may not cover all possible information. If you have questions about this medicine, talk to your doctor, pharmacist, or health care provider.  2015, Elsevier/Gold Standard. (2013-12-16 16:14:05)  

## 2014-08-22 ENCOUNTER — Other Ambulatory Visit: Payer: Medicare Other

## 2014-08-24 ENCOUNTER — Telehealth: Payer: Self-pay | Admitting: Internal Medicine

## 2014-08-24 NOTE — Telephone Encounter (Signed)
pt called to r/s missed appt...done....pt aware of new d.t °

## 2014-08-26 ENCOUNTER — Telehealth: Payer: Self-pay | Admitting: *Deleted

## 2014-08-26 ENCOUNTER — Other Ambulatory Visit (HOSPITAL_BASED_OUTPATIENT_CLINIC_OR_DEPARTMENT_OTHER): Payer: Medicare Other

## 2014-08-26 ENCOUNTER — Encounter: Payer: Self-pay | Admitting: *Deleted

## 2014-08-26 DIAGNOSIS — C155 Malignant neoplasm of lower third of esophagus: Secondary | ICD-10-CM

## 2014-08-26 DIAGNOSIS — C159 Malignant neoplasm of esophagus, unspecified: Secondary | ICD-10-CM

## 2014-08-26 LAB — COMPREHENSIVE METABOLIC PANEL (CC13)
ALK PHOS: 68 U/L (ref 40–150)
ALT: 6 U/L (ref 0–55)
ANION GAP: 11 meq/L (ref 3–11)
AST: 12 U/L (ref 5–34)
Albumin: 2.7 g/dL — ABNORMAL LOW (ref 3.5–5.0)
BUN: 16.8 mg/dL (ref 7.0–26.0)
CO2: 24 meq/L (ref 22–29)
Calcium: 8.4 mg/dL (ref 8.4–10.4)
Chloride: 104 mEq/L (ref 98–109)
Creatinine: 1.1 mg/dL (ref 0.7–1.3)
GLUCOSE: 137 mg/dL (ref 70–140)
Potassium: 3.4 mEq/L — ABNORMAL LOW (ref 3.5–5.1)
SODIUM: 139 meq/L (ref 136–145)
TOTAL PROTEIN: 5.5 g/dL — AB (ref 6.4–8.3)
Total Bilirubin: 0.84 mg/dL (ref 0.20–1.20)

## 2014-08-26 LAB — CBC WITH DIFFERENTIAL/PLATELET
BASO%: 0.3 % (ref 0.0–2.0)
Basophils Absolute: 0 10*3/uL (ref 0.0–0.1)
EOS%: 0.1 % (ref 0.0–7.0)
Eosinophils Absolute: 0 10*3/uL (ref 0.0–0.5)
HEMATOCRIT: 29 % — AB (ref 38.4–49.9)
HGB: 9.2 g/dL — ABNORMAL LOW (ref 13.0–17.1)
LYMPH%: 10.3 % — ABNORMAL LOW (ref 14.0–49.0)
MCH: 29.2 pg (ref 27.2–33.4)
MCHC: 31.7 g/dL — AB (ref 32.0–36.0)
MCV: 92.1 fL (ref 79.3–98.0)
MONO#: 1.1 10*3/uL — ABNORMAL HIGH (ref 0.1–0.9)
MONO%: 13 % (ref 0.0–14.0)
NEUT#: 6.5 10*3/uL (ref 1.5–6.5)
NEUT%: 76.3 % — AB (ref 39.0–75.0)
PLATELETS: 57 10*3/uL — AB (ref 140–400)
RBC: 3.15 10*6/uL — AB (ref 4.20–5.82)
RDW: 21.7 % — ABNORMAL HIGH (ref 11.0–14.6)
WBC: 8.5 10*3/uL (ref 4.0–10.3)
lymph#: 0.9 10*3/uL (ref 0.9–3.3)

## 2014-08-26 LAB — MAGNESIUM (CC13)

## 2014-08-26 NOTE — Telephone Encounter (Signed)
Called and left msg on pt's vm with instructions.

## 2014-08-26 NOTE — Telephone Encounter (Signed)
-----   Message from Curt Bears, MD sent at 08/26/2014 12:01 PM EST ----- Call patient with the result and increase Mg Oxide by 400 mg/day.

## 2014-08-26 NOTE — Progress Notes (Signed)
Quick Note:  Call patient with the result and increase Mg Oxide by 400 mg/day. ______

## 2014-08-29 ENCOUNTER — Telehealth: Payer: Self-pay | Admitting: *Deleted

## 2014-08-29 ENCOUNTER — Other Ambulatory Visit: Payer: Self-pay | Admitting: Internal Medicine

## 2014-08-29 ENCOUNTER — Other Ambulatory Visit (HOSPITAL_BASED_OUTPATIENT_CLINIC_OR_DEPARTMENT_OTHER): Payer: Medicare Other

## 2014-08-29 DIAGNOSIS — C155 Malignant neoplasm of lower third of esophagus: Secondary | ICD-10-CM

## 2014-08-29 DIAGNOSIS — C159 Malignant neoplasm of esophagus, unspecified: Secondary | ICD-10-CM

## 2014-08-29 LAB — COMPREHENSIVE METABOLIC PANEL (CC13)
ALBUMIN: 2.8 g/dL — AB (ref 3.5–5.0)
ALK PHOS: 91 U/L (ref 40–150)
ALT: 8 U/L (ref 0–55)
AST: 17 U/L (ref 5–34)
Anion Gap: 9 mEq/L (ref 3–11)
BUN: 11.6 mg/dL (ref 7.0–26.0)
CHLORIDE: 104 meq/L (ref 98–109)
CO2: 26 meq/L (ref 22–29)
Calcium: 8.6 mg/dL (ref 8.4–10.4)
Creatinine: 1.1 mg/dL (ref 0.7–1.3)
Glucose: 140 mg/dl (ref 70–140)
Potassium: 3.4 mEq/L — ABNORMAL LOW (ref 3.5–5.1)
SODIUM: 138 meq/L (ref 136–145)
Total Bilirubin: 0.63 mg/dL (ref 0.20–1.20)
Total Protein: 5.4 g/dL — ABNORMAL LOW (ref 6.4–8.3)

## 2014-08-29 LAB — CBC WITH DIFFERENTIAL/PLATELET
BASO%: 0.2 % (ref 0.0–2.0)
Basophils Absolute: 0 10*3/uL (ref 0.0–0.1)
EOS ABS: 0 10*3/uL (ref 0.0–0.5)
EOS%: 0 % (ref 0.0–7.0)
HCT: 27.3 % — ABNORMAL LOW (ref 38.4–49.9)
HGB: 8.9 g/dL — ABNORMAL LOW (ref 13.0–17.1)
LYMPH#: 1.2 10*3/uL (ref 0.9–3.3)
LYMPH%: 8.3 % — ABNORMAL LOW (ref 14.0–49.0)
MCH: 29.5 pg (ref 27.2–33.4)
MCHC: 32.6 g/dL (ref 32.0–36.0)
MCV: 90.4 fL (ref 79.3–98.0)
MONO#: 1.3 10*3/uL — ABNORMAL HIGH (ref 0.1–0.9)
MONO%: 9.4 % (ref 0.0–14.0)
NEUT%: 82.1 % — AB (ref 39.0–75.0)
NEUTROS ABS: 11.4 10*3/uL — AB (ref 1.5–6.5)
Platelets: 88 10*3/uL — ABNORMAL LOW (ref 140–400)
RBC: 3.02 10*6/uL — ABNORMAL LOW (ref 4.20–5.82)
RDW: 20.2 % — AB (ref 11.0–14.6)
WBC: 13.9 10*3/uL — ABNORMAL HIGH (ref 4.0–10.3)
nRBC: 0 % (ref 0–0)

## 2014-08-29 LAB — MAGNESIUM (CC13)

## 2014-08-29 NOTE — Telephone Encounter (Signed)
Per Dr Vista Mink, pt needs to continue to take magnesium TID.  Pt is aware to take 3 tabs daily of magnesium oxide.

## 2014-09-02 ENCOUNTER — Ambulatory Visit (HOSPITAL_BASED_OUTPATIENT_CLINIC_OR_DEPARTMENT_OTHER): Payer: Medicare Other | Admitting: Physician Assistant

## 2014-09-02 ENCOUNTER — Other Ambulatory Visit (HOSPITAL_BASED_OUTPATIENT_CLINIC_OR_DEPARTMENT_OTHER): Payer: Medicare Other

## 2014-09-02 ENCOUNTER — Ambulatory Visit (HOSPITAL_COMMUNITY)
Admission: RE | Admit: 2014-09-02 | Discharge: 2014-09-02 | Disposition: A | Discharge status: Home / Self Care | Payer: Medicare Other | Source: Ambulatory Visit | Attending: Internal Medicine | Admitting: Internal Medicine

## 2014-09-02 ENCOUNTER — Telehealth: Payer: Self-pay | Admitting: Physician Assistant

## 2014-09-02 ENCOUNTER — Other Ambulatory Visit (HOSPITAL_COMMUNITY): Payer: Self-pay

## 2014-09-02 ENCOUNTER — Encounter: Payer: Self-pay | Admitting: Physician Assistant

## 2014-09-02 VITALS — BP 178/78 | HR 87 | Temp 98.2°F | Resp 18 | Ht 67.0 in | Wt 195.7 lb

## 2014-09-02 DIAGNOSIS — I82431 Acute embolism and thrombosis of right popliteal vein: Secondary | ICD-10-CM | POA: Diagnosis not present

## 2014-09-02 DIAGNOSIS — C155 Malignant neoplasm of lower third of esophagus: Secondary | ICD-10-CM

## 2014-09-02 DIAGNOSIS — I82401 Acute embolism and thrombosis of unspecified deep veins of right lower extremity: Secondary | ICD-10-CM

## 2014-09-02 DIAGNOSIS — M79661 Pain in right lower leg: Secondary | ICD-10-CM

## 2014-09-02 DIAGNOSIS — I824Z1 Acute embolism and thrombosis of unspecified deep veins of right distal lower extremity: Secondary | ICD-10-CM | POA: Diagnosis not present

## 2014-09-02 DIAGNOSIS — M79604 Pain in right leg: Secondary | ICD-10-CM | POA: Diagnosis present

## 2014-09-02 DIAGNOSIS — M7989 Other specified soft tissue disorders: Principal | ICD-10-CM

## 2014-09-02 DIAGNOSIS — C159 Malignant neoplasm of esophagus, unspecified: Secondary | ICD-10-CM

## 2014-09-02 LAB — CBC WITH DIFFERENTIAL/PLATELET
BASO%: 0.2 % (ref 0.0–2.0)
Basophils Absolute: 0 10*3/uL (ref 0.0–0.1)
EOS%: 0 % (ref 0.0–7.0)
Eosinophils Absolute: 0 10*3/uL (ref 0.0–0.5)
HEMATOCRIT: 27.7 % — AB (ref 38.4–49.9)
HGB: 9.1 g/dL — ABNORMAL LOW (ref 13.0–17.1)
LYMPH#: 1.2 10*3/uL (ref 0.9–3.3)
LYMPH%: 9.7 % — AB (ref 14.0–49.0)
MCH: 30.3 pg (ref 27.2–33.4)
MCHC: 32.9 g/dL (ref 32.0–36.0)
MCV: 92.3 fL (ref 79.3–98.0)
MONO#: 1.2 10*3/uL — ABNORMAL HIGH (ref 0.1–0.9)
MONO%: 10.3 % (ref 0.0–14.0)
NEUT#: 9.6 10*3/uL — ABNORMAL HIGH (ref 1.5–6.5)
NEUT%: 79.8 % — ABNORMAL HIGH (ref 39.0–75.0)
Platelets: 187 10*3/uL (ref 140–400)
RBC: 3 10*6/uL — ABNORMAL LOW (ref 4.20–5.82)
RDW: 19.6 % — ABNORMAL HIGH (ref 11.0–14.6)
WBC: 12 10*3/uL — ABNORMAL HIGH (ref 4.0–10.3)
nRBC: 0 % (ref 0–0)

## 2014-09-02 LAB — COMPREHENSIVE METABOLIC PANEL (CC13)
ALT: 11 U/L (ref 0–55)
AST: 23 U/L (ref 5–34)
Albumin: 2.6 g/dL — ABNORMAL LOW (ref 3.5–5.0)
Alkaline Phosphatase: 89 U/L (ref 40–150)
Anion Gap: 9 mEq/L (ref 3–11)
BILIRUBIN TOTAL: 0.67 mg/dL (ref 0.20–1.20)
BUN: 14.6 mg/dL (ref 7.0–26.0)
CO2: 25 mEq/L (ref 22–29)
CREATININE: 1 mg/dL (ref 0.7–1.3)
Calcium: 8.9 mg/dL (ref 8.4–10.4)
Chloride: 101 mEq/L (ref 98–109)
EGFR: >90 mL/min/{1.73_m2} (ref >90)
Glucose: 113 mg/dl (ref 70–140)
POTASSIUM: 3.9 meq/L (ref 3.5–5.1)
Sodium: 135 mEq/L — ABNORMAL LOW (ref 136–145)
Total Protein: 5.7 g/dL — ABNORMAL LOW (ref 6.4–8.3)

## 2014-09-02 LAB — MAGNESIUM (CC13): MAGNESIUM: 1.7 mg/dL (ref 1.5–2.5)

## 2014-09-02 NOTE — Progress Notes (Signed)
VASCULAR LAB PRELIMINARY  PRELIMINARY  PRELIMINARY  PRELIMINARY  Bilateral lower extremity venous duplex  completed.    Preliminary report:  Right:  DVT noted in the popliteal vein and peroneal vein.  No evidence of superficial thrombosis.  No Baker's cyst.  Left:  No evidence of DVT, superficial thrombosis, or Baker's cyst.  Ary Rudnick, RVT 09/02/2014, 11:02 AM

## 2014-09-02 NOTE — Progress Notes (Addendum)
[facility] Telephone:([phone]) [phone]   Fax:(336) [place], [place] [address] [place] [address]  DIAGNOSIS: Metastatic esophageal adenocarcinoma initially diagnosed as Locally advanced, stage IIIB (T3, N2, M0) distal esophageal adenocarcinoma diagnosed in May of 2015  PRIOR THERAPY: Concurrent chemoradiation with weekly carboplatin for AUC of 2 and paclitaxel 45 mg/M2 for 5 weeks. Last dose was given on 03/29/1999  CURRENT THERAPY: Systemic chemotherapy with cisplatin 75 mg/M2, docetaxel 75 mg/M2 on day 1 and 5-FU 750 mg/M2 continuous infusion for 5 days. For a cycle on 05/23/2014. Status post 5 cycles.  INTERVAL HISTORY: [name] [name] 68 y.o. male returns to the clinic today for followup visit. He is tolerating his current treatment with cisplatin, docetaxel and continuous infusion 5-FU fairly well with no significant adverse effect except for lack of taste and mild fatigue. His last CT scan of the chest, abdomen and pelvis showed no evidence for disease progression and the patient continued his treatment with the same regimen. The patient denied having any significant weight loss or night sweats. He has no nausea or vomiting. He denied having any significant fever or chills. He has no chest pain or hemoptysis. He denied having any significant nausea or vomiting but continues to have mild odynophagia with no change in his bowel movement. He is here today for evaluation prior to the start of cycle #6 of his chemotherapy scheduled for 09/05/2014. He complains of significant right lower extremity swelling that started 2 to 3 days ago. The swelling has made it difficult for him to get dressed, put his shoe on that foot and ambulate. He denies trauma.  MEDICAL HISTORY: Past Medical History  Diagnosis Date  . GERD (gastroesophageal reflux disease)   . Hyperlipemia   . Barrett's esophagus 12/2013  . History of radiation  therapy 02/23/14- 04/04/14    esophagus 5040 cGy 28 sessions  . 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 Hypertension     ALLERGIES:  has No Known Allergies.  MEDICATIONS:  Current Outpatient Prescriptions  Medication Sig Dispense Refill  . dexamethasone (DECADRON) 4 MG tablet 2 tab po bid the day before, day of and day after chemo every 3 weeks. 40 tablet 1  . dutasteride (AVODART) 0.5 MG capsule Take 0.5 mg by mouth every evening.     . Garlic 5885 MG CAPS Take 1 capsule by mouth daily.    Marland Kitchen lidocaine-prilocaine (EMLA) cream Apply 1 application topically as needed. 30 g 0  . magnesium hydroxide (MILK OF MAGNESIA) 400 MG/5ML suspension Take 5 mLs by mouth daily as needed for mild constipation.    . magnesium oxide (MAG-OX) 400 (241.3 MG) MG tablet TAKE 1 TABLET BY MOUTH TWICE A DAY 60 tablet 0  . omeprazole (PRILOSEC) 40 MG capsule Take 40 mg by mouth daily.    Marland Kitchen tiotropium (SPIRIVA) 18 MCG inhalation capsule Place 1 capsule (18 mcg total) into inhaler and inhale daily. 30 capsule 6  . vitamin E (VITAMIN E) 1000 UNIT capsule Take 1,000 Units by mouth daily.    . cloNIDine (CATAPRES) 0.1 MG tablet Take 0.1 mg by mouth daily as needed (will take with diovan hct if blood pressure is greater than 130/80).     . Omega-3 Fatty Acids (FISH OIL) 1000 MG CAPS Take 1,000 mg by mouth daily.    . prochlorperazine (COMPAZINE) 10 MG tablet Take 1 tablet (10 mg total) by mouth every  6 (six) hours as needed for nausea or vomiting. (Patient not taking: Reported on 09/02/2014) 30 tablet 1  . simvastatin (ZOCOR) 40 MG tablet Take 40 mg by mouth daily.     . valsartan-hydrochlorothiazide (DIOVAN-HCT) 160-25 MG per tablet Take 1 tablet by mouth daily as needed (If blood pressure is greater than 130/80).      No current facility-administered medications for this visit.   Facility-Administered Medications Ordered in Other Visits  Medication Dose Route Frequency Provider Last Rate Last Dose  . sodium  chloride 0.9 % injection 10 mL  10 mL Intracatheter PRN Curt Bears, MD   10 mL at 05/28/14 1235    SURGICAL HISTORY:  Past Surgical History  Procedure Laterality Date  . Eus N/A 02/03/2014    Procedure: UPPER ENDOSCOPIC ULTRASOUND (EUS) LINEAR;  Surgeon: Milus Banister, MD;  Location: [facility];  Service: Endoscopy;  Laterality: N/A;    REVIEW OF SYSTEMS:  Constitutional: positive for fatigue Eyes: negative Ears, nose, mouth, throat, and face: negative Respiratory: positive for dyspnea on exertion Cardiovascular: negative Gastrointestinal: positive for odynophagia Genitourinary:negative Integument/breast: negative Hematologic/lymphatic: negative Musculoskeletal:negative Neurological: negative Behavioral/Psych: negative Endocrine: negative Allergic/Immunologic: negative   PHYSICAL EXAMINATION: General appearance: alert, cooperative, fatigued and no distress Head: Normocephalic, without obvious abnormality, atraumatic Neck: no adenopathy, no JVD, supple, symmetrical, trachea midline and thyroid not enlarged, symmetric, no tenderness/mass/nodules Lymph nodes: Cervical, supraclavicular, and axillary nodes normal. Resp: clear to auscultation bilaterally Back: symmetric, no curvature. ROM normal. No CVA tenderness. Cardio: regular rate and rhythm, S1, S2 normal, no murmur, click, rub or gallop GI: soft, non-tender; bowel sounds normal; no masses,  no organomegaly Extremities: edema 2+ to 3+ right lower extremity edema, firm, mildly tender. Left lower extremity with trace to 1+ edema Neurologic: Alert and oriented X 3, normal strength and tone. Normal symmetric reflexes. Normal coordination and gait  ECOG PERFORMANCE STATUS: 1 - Symptomatic but completely ambulatory  Blood pressure 178/78, pulse 87, temperature 98.2 F (36.8 C), temperature source Oral, resp. rate 18, height 5\' 7"  (1.702 m), weight 195 lb 11.2 oz (88.769 kg).  LABORATORY DATA: Lab Results  Component Value  Date   WBC 13.9* 08/29/2014   HGB 8.9* 08/29/2014   HCT 27.3* 08/29/2014   MCV 90.4 08/29/2014   PLT 88* 08/29/2014      Chemistry      Component Value Date/Time   NA 138 08/29/2014 1108   NA 138 01/26/2014 1148   K 3.4* 08/29/2014 1108   K 3.9 01/26/2014 1148   CL 104 01/26/2014 1148   CO2 26 08/29/2014 1108   CO2 29 01/26/2014 1148   BUN 11.6 08/29/2014 1108   BUN 11 01/26/2014 1148   CREATININE 1.1 08/29/2014 1108   CREATININE 1.0 01/26/2014 1148      Component Value Date/Time   CALCIUM 8.6 08/29/2014 1108   CALCIUM 8.8 01/26/2014 1148   ALKPHOS 91 08/29/2014 1108   ALKPHOS 62 01/26/2014 1148   AST 17 08/29/2014 1108   AST 12 01/26/2014 1148   ALT 8 08/29/2014 1108   ALT 9 01/26/2014 1148   BILITOT 0.63 08/29/2014 1108   BILITOT 0.6 01/26/2014 1148       RADIOGRAPHIC STUDIES: No results found. ASSESSMENT AND PLAN: This is a very pleasant 68 years old African American male with metastatic esophageal adenocarcinoma initially diagnosed as locally advanced disease status post a course of concurrent chemoradiation with weekly carboplatin and paclitaxel.  He is currently undergoing systemic chemotherapy with cisplatin, docetaxel and continuous  infusion 5-FU status post 5 cycles and tolerating his treatment fairly well. There is no evidence for disease progression on the last scan. The patient was discussed with and also seen by [name]. The sudden recent significant right lower extremity edema is concerning for deep vein thrombosis. The patient will be sent for right lower extremity doppler to evaluate for DVT. If positive for DVT appropriate anticoagulation therapy will be instituted. Tthe patient will continue his current treatment with the same regimen for now. He will start cycle #6 as scheduled on 09/05/2014.  Of note the patient's Doppler study was positive for a right peroneal DVT that was approaching the popliteal vein as well. The left lower extremity was  negative for DVT. He was started on Lovenox 140 mg subcutaneously every 24 hours first dose given in the office. He will follow-up in one week at which time he will be changed to Xarelto starting with the starter pack at 15 mg by mouth twice daily for 3 weeks then changing to 20 mg by mouth once daily. Patient will be treated for a minimum of 6 months.I will schedule his restaging CT scan of follow-up visit when I see him in one week. He was advised to call immediately if he has any concerning symptoms in the interval. All questions were answered. The patient knows to call the clinic with any problems, questions or concerns. We can certainly see the patient much sooner if necessary.  Carlton Adam PA-C  ADDENDUM: Hematology/Oncology Attending: I had a face to face encounter with the patient. I recommended his care plan. This is a very pleasant 68 years old African-American male with metastatic esophageal adenocarcinoma currently undergoing systemic chemotherapy with cisplatin, docetaxel and continuous infusion 5-FU status post 5 cycles. The patient is tolerating his treatment fairly well except for increasing fatigue denied having any significant nausea or vomiting. He complains today for swelling of the right lower extremity over the last few days. I recommended for the patient to have Doppler study of the lower extremities to rule out deep venous thrombosis. The study showed positive deep venous thrombosis in the right lower extremity.  We will start the patient on Lovenox 1.5 MG/KG for the next 7-10 days, then the patient will be considered for change to Xarelto initially with 15 mg by mouth twice a day for 3 weeks followed by 20 mg by mouth daily. He would be treated for 6 months with anticoagulation. Regarding his systemic chemotherapy, and the patient was started cycle #6 next week. He would come back for follow-up visit in 3 weeks for reevaluation after repeating CT scan of the chest,  abdomen and pelvis for restaging of his disease. The patient was advised to call immediately if he has any concerning symptoms in the interval.  Disclaimer: This note was dictated with voice recognition software. Similar sounding words can inadvertently be transcribed and may not be corrected upon review. Eilleen Kempf., MD 09/03/2014

## 2014-09-02 NOTE — Patient Instructions (Addendum)
Enoxaparin, Home Use Enoxaparin (Lovenox) injection is a medication used to prevent clots from developing in your veins. Medications such as enoxaparin are called blood thinners or anticoagulants. If blood clots are untreated they could travel to your lungs. This is called a pulmonary embolus. A blood clot in your lungs can be fatal. Caregivers often use anticoagulants such as enoxaparin to prevent clots following surgery. It is also used along with aspirin when the heart is not getting enough blood. Continue the enoxaparin injections as directed by your caregiver. Your caregiver will use blood clotting test results to decide when you can safely stop using enoxaparin injections. If your caregiver prescribes any additional anticoagulant, you must take it exactly as directed. RISKS AND COMPLICATIONS  If you have received recent epidural anesthesia, spinal anesthesia, or a spinal tap while receiving anticoagulants, you are at risk for developing a blood clot in or around the spine. This condition could result in long-term or permanent paralysis.  Because anticoagulants thin your blood, severe bleeding may occur from any tissue or organ. Symptoms of the blood being too thin may include:  Bleeding from the nose or gums that does not stop quickly.  Unusual bruising or bruising easily.  Swelling or pain at an injection site.  A cut that does not stop bleeding within 10 minutes.  Continual nausea for more than 1 day or vomiting blood.  Coughing up blood.  Blood in the urine which may appear as pink, red, or brown urine.  Blood in bowel movements which may appear as red, dark or black stools.  Sudden weakness or numbness of the face, arm, or leg, especially on one side of the body.  Sudden confusion.  Trouble speaking (aphasia) or understanding.  Sudden trouble seeing in one or both eyes.  Sudden trouble walking.  Dizziness.  Loss of balance or coordination.  Severe pain, such as a  headache, joint pain, or back pain.  Fever.  Bruising around the injection sites may be expected.  Platelet drops, known as "thrombocytopenia," can occur with enoxaparin use. A condition called "heparin-induced thrombocytopenia" has been seen. If you have had this condition, you should tell your caregiver. Your caregiver may direct you to have blood tests to monitor this condition.  Do not use if you have allergies to the medication, heparin, or pork products.  Other side effects may include mild local reactions or irritation at the site of injection, pain, bruising, and redness of skin. HOME CARE INSTRUCTIONS You will be instructed by your caregiver how to give enoxaparin injections. 1. Before giving your medication you should make sure the injection is a clear and colorless or pale yellow solution. If your medication becomes discolored or has particles in the bottle, do not use and notify your caregiver. 2. When using the 30 and 40 mg pre-filled syringes, do not expel the air bubble from the syringe before the injection. This makes sure you use all the medication in the syringe. 3. The injections will be given subcutaneously. This means it is given into the fat over the belly (abdomen). It is given deep beneath the skin but not into the muscle. The shots should be injected around the abdominal wall. Change the sites of injection each time. The whole length of the needle should be introduced into a skin fold held between the thumb and forefinger; the skin fold should be held throughout the injection. Do not rub the injection site after completion of the injection. This increases bruising. Enoxaparin injection pre-filled syringes  and graduated pre-filled syringes are available with a system that shields the needle after injection. 4. Inject by pushing the plunger to the bottom of the syringe. 5. Remove the syringe from the injection site keeping your finger on the plunger rod. Be careful not to  stick yourself or others. 6. After injection and the syringe is empty, set off the safety system by firmly pushing the plunger rod. The protective sleeve will automatically cover the needle and you can hear a click. The click means your needle is safely covered. Do not try replacing the needle shield. 7. Get rid of the syringe in the nearest sharps container. 8. Keep your medication safely stored at room temperatures.  Due to the complications of anticoagulants, it is very important that you take your anticoagulant as directed by your caregiver. Anticoagulants need to be taken exactly as instructed. Be sure you understand all your anticoagulant instructions.  Changes in medicines, supplements, diet, and illness can affect your anticoagulation therapy. Be sure to inform your caregivers of any of these changes.  While on anticoagulants, you will need to have blood tests done routinely as directed by your caregivers.  Be careful not to cut yourself when using sharp objects.  Limit physical activities or sports that could result in a fall or cause injury.  It is extremely important that you tell all of your caregivers and dentist that you are taking an anticoagulant, especially if you are injured or plan to have any type of procedure or operation.  Follow up with your laboratory test and caregiver appointments as directed. It is very important to keep your appointments. Not keeping appointments could result in a chronic or permanent injury, pain, or disability. SEEK MEDICAL CARE IF:  You develop any rashes.  You have any worsening of the condition for which you are receiving anticoagulation therapy. SEEK IMMEDIATE MEDICAL CARE IF:  Bleeding from the nose or gums does not stop quickly.  You have unusual bruising or are bruising easily.  Swelling or pain occurs at an injection site.  A cut does not stop bleeding within 10 minutes.  You have continual nausea for more than 1 day or are  vomiting blood.  You are coughing up blood.  You have blood in the urine.  You have dark or black stools.  You have sudden weakness or numbness of the face, arm, or leg, especially on one side of the body.  You have sudden confusion.  You have trouble speaking (aphasia) or understanding.  You have sudden trouble seeing in one or both eyes.  You have sudden trouble walking.  You have dizziness.  You have a loss of balance or coordination.  You have severe pain, such as a headache, joint pain, or back pain.  You have a serious fall or head injury, even if you are not bleeding.  You have an oral temperature above 102 F (38.9 C), not controlled by medicine. ANY OF THESE SYMPTOMS MAY REPRESENT A SERIOUS PROBLEM THAT IS AN EMERGENCY. Do not wait to see if the symptoms will go away. Get medical help right away. Call your local emergency services (911 in [place] DO NOT drive yourself to the hospital. MAKE SURE YOU:  Understand these instructions.  Will watch your condition.  Will get help right away if you are not doing well or get worse. Document Released: 07/18/2004 Document Revised: 12/09/2011 Document Reviewed: 09/16/2005 [facility] Patient Information 2015 [place], [place]. This information is not intended to replace advice given to you  by your health care provider. Make sure you discuss any questions you have with your health care provider.  Your Doppler study revealed that you have a clot in your right leg. You have been started on Lovenox injections which she will take for the next week. Follow-up in one week Continue labs and chemotherapy as scheduled

## 2014-09-02 NOTE — Telephone Encounter (Signed)
Gave avs & cal for Dec. °

## 2014-09-05 ENCOUNTER — Other Ambulatory Visit: Payer: Medicare Other

## 2014-09-05 ENCOUNTER — Other Ambulatory Visit: Payer: Self-pay | Admitting: Internal Medicine

## 2014-09-05 ENCOUNTER — Ambulatory Visit (HOSPITAL_BASED_OUTPATIENT_CLINIC_OR_DEPARTMENT_OTHER): Payer: Medicare Other

## 2014-09-05 DIAGNOSIS — C159 Malignant neoplasm of esophagus, unspecified: Secondary | ICD-10-CM

## 2014-09-05 DIAGNOSIS — C155 Malignant neoplasm of lower third of esophagus: Secondary | ICD-10-CM

## 2014-09-05 DIAGNOSIS — Z5111 Encounter for antineoplastic chemotherapy: Secondary | ICD-10-CM

## 2014-09-05 MED ORDER — SODIUM CHLORIDE 0.9 % IV SOLN
75.0000 mg/m2 | Freq: Once | INTRAVENOUS | Status: AC
  Administered 2014-09-05: 157 mg via INTRAVENOUS
  Filled 2014-09-05: qty 157

## 2014-09-05 MED ORDER — DEXAMETHASONE SODIUM PHOSPHATE 20 MG/5ML IJ SOLN
INTRAMUSCULAR | Status: AC
  Filled 2014-09-05: qty 5

## 2014-09-05 MED ORDER — DEXAMETHASONE SODIUM PHOSPHATE 20 MG/5ML IJ SOLN
12.0000 mg | Freq: Once | INTRAMUSCULAR | Status: AC
  Administered 2014-09-05: 12 mg via INTRAVENOUS

## 2014-09-05 MED ORDER — MANNITOL 25 % IV SOLN
Freq: Once | INTRAVENOUS | Status: AC
  Administered 2014-09-05: 10:00:00 via INTRAVENOUS
  Filled 2014-09-05: qty 10

## 2014-09-05 MED ORDER — PALONOSETRON HCL INJECTION 0.25 MG/5ML
0.2500 mg | Freq: Once | INTRAVENOUS | Status: AC
  Administered 2014-09-05: 0.25 mg via INTRAVENOUS

## 2014-09-05 MED ORDER — SODIUM CHLORIDE 0.9 % IV SOLN
150.0000 mg | Freq: Once | INTRAVENOUS | Status: AC
  Administered 2014-09-05: 150 mg via INTRAVENOUS
  Filled 2014-09-05: qty 5

## 2014-09-05 MED ORDER — SODIUM CHLORIDE 0.9 % IV SOLN
Freq: Once | INTRAVENOUS | Status: AC
  Administered 2014-09-05: 10:00:00 via INTRAVENOUS

## 2014-09-05 MED ORDER — DOCETAXEL CHEMO INJECTION 160 MG/16ML
75.0000 mg/m2 | Freq: Once | INTRAVENOUS | Status: AC
  Administered 2014-09-05: 160 mg via INTRAVENOUS
  Filled 2014-09-05: qty 16

## 2014-09-05 MED ORDER — PALONOSETRON HCL INJECTION 0.25 MG/5ML
INTRAVENOUS | Status: AC
  Filled 2014-09-05: qty 5

## 2014-09-05 MED ORDER — SODIUM CHLORIDE 0.9 % IV SOLN
750.0000 mg/m2/d | INTRAVENOUS | Status: DC
  Administered 2014-09-05: 7850 mg via INTRAVENOUS
  Filled 2014-09-05: qty 157

## 2014-09-05 NOTE — Patient Instructions (Signed)
[facility] Discharge Instructions for Patients Receiving Chemotherapy  Today you received the following chemotherapy agents Taxotere, Cisplatin, 5FU  To help prevent nausea and vomiting after your treatment, we encourage you to take your nausea medication as prescribed.   If you develop nausea and vomiting that is not controlled by your nausea medication, call the clinic.   BELOW ARE SYMPTOMS THAT SHOULD BE REPORTED IMMEDIATELY:  *FEVER GREATER THAN 100.5 F  *CHILLS WITH OR WITHOUT FEVER  NAUSEA AND VOMITING THAT IS NOT CONTROLLED WITH YOUR NAUSEA MEDICATION  *UNUSUAL SHORTNESS OF BREATH  *UNUSUAL BRUISING OR BLEEDING  TENDERNESS IN MOUTH AND THROAT WITH OR WITHOUT PRESENCE OF ULCERS  *URINARY PROBLEMS  *BOWEL PROBLEMS  UNUSUAL RASH Items with * indicate a potential emergency and should be followed up as soon as possible.  Feel free to call the clinic you have any questions or concerns. The clinic phone number is [phone].

## 2014-09-05 NOTE — Progress Notes (Signed)
1315 Proceed with treatment per Dr.[name]. MD aware patient has yet to void.

## 2014-09-09 ENCOUNTER — Encounter: Payer: Self-pay | Admitting: Physician Assistant

## 2014-09-09 ENCOUNTER — Ambulatory Visit (HOSPITAL_BASED_OUTPATIENT_CLINIC_OR_DEPARTMENT_OTHER): Payer: Medicare Other | Admitting: Physician Assistant

## 2014-09-09 ENCOUNTER — Telehealth: Payer: Self-pay | Admitting: Physician Assistant

## 2014-09-09 ENCOUNTER — Other Ambulatory Visit (HOSPITAL_BASED_OUTPATIENT_CLINIC_OR_DEPARTMENT_OTHER): Payer: Medicare Other

## 2014-09-09 VITALS — BP 101/58 | HR 85 | Temp 97.9°F | Resp 18 | Ht 67.0 in | Wt 200.5 lb

## 2014-09-09 DIAGNOSIS — C159 Malignant neoplasm of esophagus, unspecified: Secondary | ICD-10-CM

## 2014-09-09 DIAGNOSIS — R530 Neoplastic (malignant) related fatigue: Secondary | ICD-10-CM

## 2014-09-09 DIAGNOSIS — C155 Malignant neoplasm of lower third of esophagus: Secondary | ICD-10-CM

## 2014-09-09 DIAGNOSIS — D709 Neutropenia, unspecified: Secondary | ICD-10-CM

## 2014-09-09 DIAGNOSIS — I82401 Acute embolism and thrombosis of unspecified deep veins of right lower extremity: Secondary | ICD-10-CM

## 2014-09-09 LAB — CBC WITH DIFFERENTIAL/PLATELET
BASO%: 0.5 % (ref 0.0–2.0)
BASOS ABS: 0 10*3/uL (ref 0.0–0.1)
EOS%: 0.5 % (ref 0.0–7.0)
Eosinophils Absolute: 0 10*3/uL (ref 0.0–0.5)
HCT: 31 % — ABNORMAL LOW (ref 38.4–49.9)
HEMOGLOBIN: 9.9 g/dL — AB (ref 13.0–17.1)
LYMPH#: 0.4 10*3/uL — AB (ref 0.9–3.3)
LYMPH%: 26.8 % (ref 14.0–49.0)
MCH: 30.4 pg (ref 27.2–33.4)
MCHC: 31.9 g/dL — AB (ref 32.0–36.0)
MCV: 95.2 fL (ref 79.3–98.0)
MONO#: 0.1 10*3/uL (ref 0.1–0.9)
MONO%: 4.5 % (ref 0.0–14.0)
NEUT#: 1.1 10*3/uL — ABNORMAL LOW (ref 1.5–6.5)
NEUT%: 67.7 % (ref 39.0–75.0)
Platelets: 236 10*3/uL (ref 140–400)
RBC: 3.26 10*6/uL — ABNORMAL LOW (ref 4.20–5.82)
RDW: 19.6 % — AB (ref 11.0–14.6)
WBC: 1.6 10*3/uL — ABNORMAL LOW (ref 4.0–10.3)

## 2014-09-09 LAB — COMPREHENSIVE METABOLIC PANEL (CC13)
ALT: 35 U/L (ref 0–55)
AST: 37 U/L — AB (ref 5–34)
Albumin: 2.6 g/dL — ABNORMAL LOW (ref 3.5–5.0)
Alkaline Phosphatase: 65 U/L (ref 40–150)
Anion Gap: 11 mEq/L (ref 3–11)
BUN: 32.1 mg/dL — ABNORMAL HIGH (ref 7.0–26.0)
CALCIUM: 8.3 mg/dL — AB (ref 8.4–10.4)
CO2: 27 mEq/L (ref 22–29)
Chloride: 100 mEq/L (ref 98–109)
Creatinine: 1 mg/dL (ref 0.7–1.3)
EGFR: 85 mL/min/{1.73_m2} — ABNORMAL LOW (ref >90)
Glucose: 113 mg/dl (ref 70–140)
POTASSIUM: 3.9 meq/L (ref 3.5–5.1)
Sodium: 137 mEq/L (ref 136–145)
Total Bilirubin: 0.5 mg/dL (ref 0.20–1.20)
Total Protein: 5.4 g/dL — ABNORMAL LOW (ref 6.4–8.3)

## 2014-09-09 LAB — MAGNESIUM (CC13): Magnesium: 1.9 mg/dl (ref 1.5–2.5)

## 2014-09-09 MED ORDER — RIVAROXABAN (XARELTO) VTE STARTER PACK (15 & 20 MG): ORAL_TABLET | ORAL | Status: DC

## 2014-09-09 MED ORDER — TRAMADOL HCL 50 MG PO TABS: 50.0000 mg | ORAL_TABLET | Freq: Four times a day (QID) | ORAL | Status: DC | PRN

## 2014-09-09 NOTE — Telephone Encounter (Signed)
LM to confirm added weekly labs. Mailed new cal.

## 2014-09-09 NOTE — Patient Instructions (Signed)
Take the Xarelto as prescribed Continue weekly labs as scheduled Follow up in 3 weeks with a restaging CT scan of your chest, abdomen and pelvis to re-evaluate your disease

## 2014-09-09 NOTE — Progress Notes (Signed)
[name] [phone]   Fax:([phone]) [place], [place] [address]  DIAGNOSIS: Metastatic esophageal adenocarcinoma initially diagnosed as Locally advanced, stage IIIB (T3, N2, M0) distal esophageal adenocarcinoma diagnosed in May of 2015  PRIOR THERAPY: Concurrent chemoradiation with weekly carboplatin for AUC of 2 and paclitaxel 45 mg/M2 for 5 weeks. Last dose was given on 03/29/1999  CURRENT THERAPY: Systemic chemotherapy with cisplatin 75 mg/M2, docetaxel 75 mg/M2 on day 1 and 5-FU 750 mg/M2 continuous infusion for 5 days. For cycle one on 05/23/2014. Status post 5 cycles.  INTERVAL HISTORY: [name] [name] 68 y.o. male returns to the clinic today for followup visit. He is tolerating his current treatment with cisplatin, docetaxel and continuous infusion 5-FU fairly well with no significant adverse effect except for lack of taste and mild fatigue. He started cycle #6 on 09/05/2014. At his last office visit on 09/02/2014 he was found to have a DVT involving the right leg. He tolerated the Lovenox injections without difficulty. He present today for a follow up appointment and to be changed to an oral anticoagulation medication. He complains of pain related to the swelling from the right lower extremity DVT. Extra strength Tylenol is not helpful to alleviate the pain. His last CT scan of the chest, abdomen and pelvis showed no evidence for disease progression and the patient continued his treatment with the same regimen. The patient denied having any significant weight loss or night sweats. He has no nausea or vomiting. He denied having any significant fever or chills. He has no chest pain or hemoptysis. He denied having any significant nausea or vomiting but continues to have mild odynophagia with no change in his bowel movement.   MEDICAL HISTORY: Past Medical History  Diagnosis Date  .  GERD (gastroesophageal reflux disease)   . Hyperlipemia   . Barrett's esophagus 12/2013  . History of radiation therapy 02/23/14- 04/04/14    esophagus 5040 cGy 28 sessions  . 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 Hypertension     ALLERGIES:  has No Known Allergies.  MEDICATIONS:  Current Outpatient Prescriptions  Medication Sig Dispense Refill  . cloNIDine (CATAPRES) 0.1 MG tablet Take 0.1 mg by mouth daily as needed (will take with diovan hct if blood pressure is greater than 130/80).     Marland Kitchen dexamethasone (DECADRON) 4 MG tablet 2 tab po bid the day before, day of and day after chemo every 3 weeks. 40 tablet 1  . dutasteride (AVODART) 0.5 MG capsule Take 0.5 mg by mouth every evening.     . Garlic 3428 MG CAPS Take 1 capsule by mouth daily.    Marland Kitchen lidocaine-prilocaine (EMLA) cream Apply 1 application topically as needed. 30 g 0  . magnesium hydroxide (MILK OF MAGNESIA) 400 MG/5ML suspension Take 5 mLs by mouth daily as needed for mild constipation.    . magnesium oxide (MAG-OX) 400 (241.3 MG) MG tablet TAKE 1 TABLET BY MOUTH TWICE A DAY 60 tablet 0  . Omega-3 Fatty Acids (FISH OIL) 1000 MG CAPS Take 1,000 mg by mouth daily.    Marland Kitchen omeprazole (PRILOSEC) 40 MG capsule Take 40 mg by mouth daily.    . prochlorperazine (COMPAZINE) 10 MG tablet Take 1 tablet (10 mg total) by mouth every 6 (six) hours as needed for nausea or vomiting. 30 tablet 1  . simvastatin (ZOCOR) 40 MG tablet Take 40 mg  by mouth daily.     Marland Kitchen tiotropium (SPIRIVA) 18 MCG inhalation capsule Place 1 capsule (18 mcg total) into inhaler and inhale daily. 30 capsule 6  . vitamin E (VITAMIN E) 1000 UNIT capsule Take 1,000 Units by mouth daily.    . Rivaroxaban (XARELTO STARTER PACK) 15 & 20 MG TBPK Take as directed on package: Start with one 15mg  tablet by mouth twice a day with food. 51 each 0  . traMADol (ULTRAM) 50 MG tablet Take 1 tablet (50 mg total) by mouth every 6 (six) hours as needed. 30 tablet 0  .  valsartan-hydrochlorothiazide (DIOVAN-HCT) 160-25 MG per tablet Take 1 tablet by mouth daily as needed (If blood pressure is greater than 130/80).      No current facility-administered medications for this visit.   Facility-Administered Medications Ordered in Other Visits  Medication Dose Route Frequency Provider Last Rate Last Dose  . sodium chloride 0.9 % injection 10 mL  10 mL Intracatheter PRN Curt Bears, MD   10 mL at 05/28/14 1235    SURGICAL HISTORY:  Past Surgical History  Procedure Laterality Date  . Eus N/A 02/03/2014    Procedure: UPPER ENDOSCOPIC ULTRASOUND (EUS) LINEAR;  Surgeon: Milus Banister, MD;  Location: [facility];  Service: Endoscopy;  Laterality: N/A;    REVIEW OF SYSTEMS:  Constitutional: positive for fatigue Eyes: negative Ears, nose, mouth, throat, and face: negative Respiratory: positive for dyspnea on exertion Cardiovascular: negative Gastrointestinal: positive for odynophagia Genitourinary:negative Integument/breast: negative Hematologic/lymphatic: negative Musculoskeletal:positive for right lower extremity swelling and pain - DVT diagnosed 09/02/2014 Neurological: negative Behavioral/Psych: negative Endocrine: negative Allergic/Immunologic: negative   PHYSICAL EXAMINATION: General appearance: alert, cooperative, fatigued and no distress Head: Normocephalic, without obvious abnormality, atraumatic Neck: no adenopathy, no JVD, supple, symmetrical, trachea midline and thyroid not enlarged, symmetric, no tenderness/mass/nodules Lymph nodes: Cervical, supraclavicular, and axillary nodes normal. Resp: clear to auscultation bilaterally Back: symmetric, no curvature. ROM normal. No CVA tenderness. Cardio: regular rate and rhythm, S1, S2 normal, no murmur, click, rub or gallop GI: soft, non-tender; bowel sounds normal; no masses,  no organomegaly Extremities: edema 2+ to 3+ right lower extremity edema, firm, mildly tender. Left lower extremity with  trace to 1+ edema Neurologic: Alert and oriented X 3, normal strength and tone. Normal symmetric reflexes. Normal coordination and gait  ECOG PERFORMANCE STATUS: 1 - Symptomatic but completely ambulatory  Blood pressure 101/58, pulse 85, temperature 97.9 F (36.6 C), temperature source Oral, resp. rate 18, height 5\' 7"  (1.702 m), weight 200 lb 8 oz (90.946 kg).  LABORATORY DATA: Lab Results  Component Value Date   WBC 1.6* 09/09/2014   HGB 9.9* 09/09/2014   HCT 31.0* 09/09/2014   MCV 95.2 09/09/2014   PLT 236 09/09/2014      Chemistry      Component Value Date/Time   NA 137 09/09/2014 1020   NA 138 01/26/2014 1148   K 3.9 09/09/2014 1020   K 3.9 01/26/2014 1148   CL 104 01/26/2014 1148   CO2 27 09/09/2014 1020   CO2 29 01/26/2014 1148   BUN 32.1* 09/09/2014 1020   BUN 11 01/26/2014 1148   CREATININE 1.0 09/09/2014 1020   CREATININE 1.0 01/26/2014 1148      Component Value Date/Time   CALCIUM 8.3* 09/09/2014 1020   CALCIUM 8.8 01/26/2014 1148   ALKPHOS 65 09/09/2014 1020   ALKPHOS 62 01/26/2014 1148   AST 37* 09/09/2014 1020   AST 12 01/26/2014 1148   ALT 35 09/09/2014 1020  ALT 9 01/26/2014 1148   BILITOT 0.50 09/09/2014 1020   BILITOT 0.6 01/26/2014 1148       RADIOGRAPHIC STUDIES: No results found. ASSESSMENT AND PLAN: This is a very pleasant 68 years old African American male with metastatic esophageal adenocarcinoma initially diagnosed as locally advanced disease status post a course of concurrent chemoradiation with weekly carboplatin and paclitaxel.  He is currently undergoing systemic chemotherapy with cisplatin, docetaxel and continuous infusion 5-FU status post 5 cycles and tolerating his treatment fairly well. There is no evidence for disease progression on the last scan. He is positive for DVT in the right lower extremity on 09/02/2014. He is completing 7 days of Lovenox at 1.5 mg/kg. A prescription for Xarelto starter pack was sent to his pharmacy of  record via [facility]. He will take Xarelto 15 mg by mouth twice a day for 21 days then on day 22 he will start Xarelto 20 mg by mouth daily. A total of six months of anticoagulation therapy is planned. For pain management the patient was given a prescription for Ultram 50 mg by mouth every 6 hours as needed for pain, a total of 30 tablets with no refill. He is neutropenic today with a total white count of 1.6 and ANC of 1.1. He is afebrile. He is due for his pump d/c on 09/10/2014 and will receive Neulasta for neutrophil support. Neutropenic precautions were reviewed with the patient and he voiced understanding. He will complete cycle #6 as scheduled on 09/10/2014.  He will follow up in 3 weeks with a restaging CT scan of the chest, abdomen and pelvis with contrast to re-evaluate his disease. He will continue with weekly labs as scheduled.  He was advised to call immediately if he has any concerning symptoms in the interval. All questions were answered. The patient knows to call the clinic with any problems, questions or concerns. We can certainly see the patient much sooner if necessary.  Carlton Adam, PA-C 09/09/2014

## 2014-09-10 ENCOUNTER — Ambulatory Visit: Payer: Medicare Other

## 2014-09-10 ENCOUNTER — Ambulatory Visit (HOSPITAL_BASED_OUTPATIENT_CLINIC_OR_DEPARTMENT_OTHER): Payer: Medicare Other

## 2014-09-10 DIAGNOSIS — Z5189 Encounter for other specified aftercare: Secondary | ICD-10-CM

## 2014-09-10 DIAGNOSIS — C155 Malignant neoplasm of lower third of esophagus: Secondary | ICD-10-CM

## 2014-09-10 MED ORDER — SODIUM CHLORIDE 0.9 % IJ SOLN
10.0000 mL | INTRAMUSCULAR | Status: DC | PRN
  Administered 2014-09-10: 10 mL
  Filled 2014-09-10: qty 10

## 2014-09-10 MED ORDER — PEGFILGRASTIM INJECTION 6 MG/0.6ML
6.0000 mg | Freq: Once | SUBCUTANEOUS | Status: AC
  Administered 2014-09-10: 6 mg via SUBCUTANEOUS

## 2014-09-10 MED ORDER — HEPARIN SOD (PORK) LOCK FLUSH 100 UNIT/ML IV SOLN
500.0000 [IU] | Freq: Once | INTRAVENOUS | Status: AC | PRN
  Administered 2014-09-10: 500 [IU]
  Filled 2014-09-10: qty 5

## 2014-09-14 ENCOUNTER — Emergency Department (HOSPITAL_COMMUNITY): Payer: Medicare Other

## 2014-09-14 ENCOUNTER — Encounter (HOSPITAL_COMMUNITY): Payer: Self-pay | Admitting: Emergency Medicine

## 2014-09-14 ENCOUNTER — Inpatient Hospital Stay (HOSPITAL_COMMUNITY)
Admission: EM | Admit: 2014-09-14 | Discharge: 2014-09-19 | DRG: 291 | Disposition: A | Discharge status: Skilled Nursing Facility | Payer: Medicare Other | Attending: Internal Medicine | Admitting: Internal Medicine

## 2014-09-14 DIAGNOSIS — N390 Urinary tract infection, site not specified: Secondary | ICD-10-CM | POA: Diagnosis present

## 2014-09-14 DIAGNOSIS — C155 Malignant neoplasm of lower third of esophagus: Secondary | ICD-10-CM | POA: Diagnosis present

## 2014-09-14 DIAGNOSIS — E785 Hyperlipidemia, unspecified: Secondary | ICD-10-CM | POA: Diagnosis present

## 2014-09-14 DIAGNOSIS — Z85118 Personal history of other malignant neoplasm of bronchus and lung: Secondary | ICD-10-CM

## 2014-09-14 DIAGNOSIS — E876 Hypokalemia: Secondary | ICD-10-CM | POA: Diagnosis present

## 2014-09-14 DIAGNOSIS — Z87891 Personal history of nicotine dependence: Secondary | ICD-10-CM

## 2014-09-14 DIAGNOSIS — J849 Interstitial pulmonary disease, unspecified: Secondary | ICD-10-CM | POA: Diagnosis present

## 2014-09-14 DIAGNOSIS — J449 Chronic obstructive pulmonary disease, unspecified: Secondary | ICD-10-CM | POA: Diagnosis present

## 2014-09-14 DIAGNOSIS — C159 Malignant neoplasm of esophagus, unspecified: Secondary | ICD-10-CM | POA: Diagnosis present

## 2014-09-14 DIAGNOSIS — E663 Overweight: Secondary | ICD-10-CM | POA: Diagnosis present

## 2014-09-14 DIAGNOSIS — R627 Adult failure to thrive: Secondary | ICD-10-CM | POA: Diagnosis present

## 2014-09-14 DIAGNOSIS — R531 Weakness: Secondary | ICD-10-CM | POA: Diagnosis not present

## 2014-09-14 DIAGNOSIS — D61818 Other pancytopenia: Secondary | ICD-10-CM | POA: Diagnosis present

## 2014-09-14 DIAGNOSIS — Z79899 Other long term (current) drug therapy: Secondary | ICD-10-CM

## 2014-09-14 DIAGNOSIS — Z7901 Long term (current) use of anticoagulants: Secondary | ICD-10-CM

## 2014-09-14 DIAGNOSIS — D61810 Antineoplastic chemotherapy induced pancytopenia: Secondary | ICD-10-CM | POA: Diagnosis present

## 2014-09-14 DIAGNOSIS — E43 Unspecified severe protein-calorie malnutrition: Secondary | ICD-10-CM | POA: Diagnosis present

## 2014-09-14 DIAGNOSIS — I5031 Acute diastolic (congestive) heart failure: Principal | ICD-10-CM | POA: Diagnosis present

## 2014-09-14 DIAGNOSIS — K2270 Barrett's esophagus without dysplasia: Secondary | ICD-10-CM | POA: Diagnosis present

## 2014-09-14 DIAGNOSIS — I82409 Acute embolism and thrombosis of unspecified deep veins of unspecified lower extremity: Secondary | ICD-10-CM | POA: Diagnosis present

## 2014-09-14 DIAGNOSIS — Z86718 Personal history of other venous thrombosis and embolism: Secondary | ICD-10-CM

## 2014-09-14 DIAGNOSIS — Z923 Personal history of irradiation: Secondary | ICD-10-CM

## 2014-09-14 DIAGNOSIS — K219 Gastro-esophageal reflux disease without esophagitis: Secondary | ICD-10-CM | POA: Diagnosis present

## 2014-09-14 DIAGNOSIS — J432 Centrilobular emphysema: Secondary | ICD-10-CM | POA: Diagnosis present

## 2014-09-14 DIAGNOSIS — R54 Age-related physical debility: Secondary | ICD-10-CM | POA: Diagnosis present

## 2014-09-14 DIAGNOSIS — E860 Dehydration: Secondary | ICD-10-CM | POA: Diagnosis present

## 2014-09-14 DIAGNOSIS — Z6829 Body mass index (BMI) 29.0-29.9, adult: Secondary | ICD-10-CM

## 2014-09-14 DIAGNOSIS — T451X5A Adverse effect of antineoplastic and immunosuppressive drugs, initial encounter: Secondary | ICD-10-CM | POA: Diagnosis present

## 2014-09-14 DIAGNOSIS — I10 Essential (primary) hypertension: Secondary | ICD-10-CM | POA: Diagnosis present

## 2014-09-14 DIAGNOSIS — I4891 Unspecified atrial fibrillation: Secondary | ICD-10-CM | POA: Diagnosis present

## 2014-09-14 LAB — CBC WITH DIFFERENTIAL/PLATELET
BASOS PCT: 1 % (ref 0–1)
Basophils Absolute: 0 10*3/uL (ref 0.0–0.1)
Eosinophils Absolute: 0 10*3/uL (ref 0.0–0.7)
Eosinophils Relative: 0 % (ref 0–5)
HCT: 25.3 % — ABNORMAL LOW (ref 39.0–52.0)
Hemoglobin: 8.5 g/dL — ABNORMAL LOW (ref 13.0–17.0)
LYMPHS ABS: 0.4 10*3/uL — AB (ref 0.7–4.0)
Lymphocytes Relative: 33 % (ref 12–46)
MCH: 31 pg (ref 26.0–34.0)
MCHC: 33.6 g/dL (ref 30.0–36.0)
MCV: 92.3 fL (ref 78.0–100.0)
MONOS PCT: 48 % — AB (ref 3–12)
Monocytes Absolute: 0.6 10*3/uL (ref 0.1–1.0)
NEUTROS ABS: 0.2 10*3/uL — AB (ref 1.7–7.7)
NEUTROS PCT: 19 % — AB (ref 43–77)
PLATELETS: 56 10*3/uL — AB (ref 150–400)
RBC: 2.74 MIL/uL — AB (ref 4.22–5.81)
RDW: 16.5 % — ABNORMAL HIGH (ref 11.5–15.5)
WBC: 1.2 10*3/uL — CL (ref 4.0–10.5)

## 2014-09-14 LAB — URINALYSIS, ROUTINE W REFLEX MICROSCOPIC
BILIRUBIN URINE: NEGATIVE
Glucose, UA: 100 mg/dL — AB
Ketones, ur: NEGATIVE mg/dL
Leukocytes, UA: NEGATIVE
Nitrite: NEGATIVE
PH: 7 (ref 5.0–8.0)
Protein, ur: 100 mg/dL — AB
Specific Gravity, Urine: 1.016 (ref 1.005–1.030)
Urobilinogen, UA: 0.2 mg/dL (ref 0.0–1.0)

## 2014-09-14 LAB — COMPREHENSIVE METABOLIC PANEL
ALK PHOS: 54 U/L (ref 39–117)
ALT: 25 U/L (ref 0–53)
AST: 33 U/L (ref 0–37)
Albumin: 2.4 g/dL — ABNORMAL LOW (ref 3.5–5.2)
Anion gap: 14 (ref 5–15)
BUN: 20 mg/dL (ref 6–23)
CO2: 24 mEq/L (ref 19–32)
Calcium: 8.4 mg/dL (ref 8.4–10.5)
Chloride: 90 mEq/L — ABNORMAL LOW (ref 96–112)
Creatinine, Ser: 1.22 mg/dL (ref 0.50–1.35)
GFR calc non Af Amer: 59 mL/min — ABNORMAL LOW (ref >90)
GFR, EST AFRICAN AMERICAN: 69 mL/min — AB (ref >90)
GLUCOSE: 117 mg/dL — AB (ref 70–99)
POTASSIUM: 3.2 meq/L — AB (ref 3.7–5.3)
Sodium: 128 mEq/L — ABNORMAL LOW (ref 137–147)
Total Bilirubin: 1.4 mg/dL — ABNORMAL HIGH (ref 0.3–1.2)
Total Protein: 5.7 g/dL — ABNORMAL LOW (ref 6.0–8.3)

## 2014-09-14 LAB — URINE MICROSCOPIC-ADD ON

## 2014-09-14 LAB — I-STAT CG4 LACTIC ACID, ED: Lactic Acid, Venous: 2.63 mmol/L — ABNORMAL HIGH (ref 0.5–2.2)

## 2014-09-14 LAB — TROPONIN I

## 2014-09-14 MED ORDER — SODIUM CHLORIDE 0.9 % IV BOLUS (SEPSIS)
500.0000 mL | Freq: Once | INTRAVENOUS | Status: AC
  Administered 2014-09-14: 500 mL via INTRAVENOUS

## 2014-09-14 MED ORDER — POTASSIUM CHLORIDE CRYS ER 20 MEQ PO TBCR
40.0000 meq | EXTENDED_RELEASE_TABLET | Freq: Once | ORAL | Status: AC
  Administered 2014-09-14: 40 meq via ORAL
  Filled 2014-09-14: qty 2

## 2014-09-14 NOTE — ED Notes (Signed)
MD at bedside. 

## 2014-09-14 NOTE — ED Notes (Signed)
Bedside report received from previous RN, Beth. 

## 2014-09-14 NOTE — Progress Notes (Signed)
[facility] provided patient with phone number to [facility] for transportation as patient is having difficulty getting to appointments.

## 2014-09-14 NOTE — ED Notes (Signed)
Bed: YQ03 Expected date:  Expected time:  Means of arrival:  Comments: EMS-weak

## 2014-09-14 NOTE — Progress Notes (Signed)
  CARE MANAGEMENT ED NOTE 09/14/2014  Patient:  [name] [name],[name] [name]   Account Number:  [id number]  Date Initiated:  09/14/2014  Documentation initiated by:  Livia Snellen  Subjective/Objective Assessment:   Patient presents to Ed with increased weakness, falling at home     Subjective/Objective Assessment Detail:   Patient with pmhx of lung cancer seen by [name] at [facility]. Last chemo on Monday and has been felling worse since then  WBC  1.2     Action/Plan:   Action/Plan Detail:   Anticipated DC Date:       Status Recommendation to Physician:   Result of Recommendation:    Other ED Services  Consult Working [place]  Other    Choice offered to / List presented to:            Status of service:  Completed, signed off  ED Comments:   ED Comments Detail:  EDCM spoke to patient and his grand daughter and her husband at bedside.  Patient's grand daughter lives in [place].  Patient lives at home alone.  Patient rpeorts he has been driving up until "My legs gave out."  Patient reports he does not have home health services atthis time, but is interested in receiving them.  Patient reports he wears oxygen at night but cannot remember the name of the agency who supplies the oxygen.  EDCM provided patient with list of home health agencies in [facility] and list of private duty nursing agencies.  EDCM explained that private duty nursing would be an out of pocket expense for him. EDCM explained with home health the patient may receive a visiting RN, PT, OT, aide and social worker if needed. Patient and family thankful for services.  No further EDCM needs at this time.

## 2014-09-14 NOTE — ED Notes (Signed)
Per EMS-hx lung CA, had chemotherapy on Monday. Has had increased fatigue since then. Fell last night. Refused to be taken into the hospital. [place] again at approximately 0200 am this morning. No injuries or obvious deformities. SCCA clear. Has not taken home medications today. VS: HR 100 BP 130/77 RR 18 SpO2 96%. Hx COPD. No neurological deficit noted.

## 2014-09-14 NOTE — ED Provider Notes (Signed)
CSN: [id number]     Arrival date & time 09/14/14  1842 History   First MD Initiated Contact with Patient 09/14/14 1849     Chief Complaint  Patient presents with  . Weakness  . Lung Cancer     (Consider location/radiation/quality/duration/timing/severity/associated sxs/prior Treatment) HPI Comments: Patient presents to the ER for evaluation of generalized weakness. Patient reports that he has lung cancer and is currently undergoing chemotherapy. He reports that his last chemotherapy infusion was a week and a half ago. Since that time he has been having SOB worsening fatigue. He reports that his legs are very weak and he has been having trouble walking. He did have a fall last night because of the weakness. He has fallen twice in the last 24 hours. He denies any injury. He has not had any fever, nausea, vomiting, diarrhea. Patient reports that part of his ambulation difficulty is because he was recently diagnosed with a blood clot in the right leg. He is currently on blood thinner for this. He has not taken his medications today.  Patient is a 68 y.o. male presenting with weakness.  Weakness    Past Medical History  Diagnosis Date  . GERD (gastroesophageal reflux disease)   . Hyperlipemia   . Barrett's esophagus 12/2013  . History of radiation therapy 02/23/14- 04/04/14    esophagus 5040 cGy 28 sessions  . 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 Hypertension    Past Surgical History  Procedure Laterality Date  . Eus N/A 02/03/2014    Procedure: UPPER ENDOSCOPIC ULTRASOUND (EUS) LINEAR;  Surgeon: Milus Banister, MD;  Location: [facility];  Service: Endoscopy;  Laterality: N/A;   Family History  Problem Relation Age of Onset  . Breast cancer Sister    History  Substance Use Topics  . Smoking status: Former Smoker -- 2.00 packs/day for 35 years    Types: Cigarettes    Quit date: 09/30/1988  . Smokeless tobacco: Never Used     Comment: pt thinks he stopped smoking in 1990s   . Alcohol Use: Yes     Comment: occasional, history of alcohol abuse    Review of Systems  Constitutional: Positive for fatigue.  Neurological: Positive for weakness.  All other systems reviewed and are negative.     Allergies  Review of patient's allergies indicates no known allergies.  Home Medications   Prior to Admission medications   Medication Sig Start Date End Date Taking? Authorizing Provider  dexamethasone (DECADRON) 4 MG tablet 2 tab po bid the day before, day of and day after chemo every 3 weeks. 06/13/14  Yes Drue Second, NP  dutasteride (AVODART) 0.5 MG capsule Take 0.5 mg by mouth every evening.    Yes Historical Provider, MD  lidocaine-prilocaine (EMLA) cream Apply 1 application topically as needed. 05/18/14  Yes Curt Bears, MD  magnesium oxide (MAG-OX) 400 (241.3 MG) MG tablet TAKE 1 TABLET BY MOUTH TWICE A DAY 08/29/14  Yes Curt Bears, MD  omeprazole (PRILOSEC) 40 MG capsule Take 40 mg by mouth daily.   Yes Historical Provider, MD  Rivaroxaban (XARELTO) 15 MG TABS tablet Take 15 mg by mouth 2 (two) times daily with a meal.   Yes Historical Provider, MD  traMADol (ULTRAM) 50 MG tablet Take 1 tablet (50 mg total) by mouth every 6 (six) hours as needed. 09/09/14  Yes Adrena E Johnson, PA-C  valsartan-hydrochlorothiazide (DIOVAN-HCT) 160-25 MG per tablet Take 1 tablet by mouth daily as needed (If blood pressure  is greater than 130/80).    Yes Historical Provider, MD  Garlic 3762 MG CAPS Take 1 capsule by mouth daily.    Historical Provider, MD  magnesium hydroxide (MILK OF MAGNESIA) 400 MG/5ML suspension Take 5 mLs by mouth daily as needed for mild constipation.    Historical Provider, MD  prochlorperazine (COMPAZINE) 10 MG tablet Take 1 tablet (10 mg total) by mouth every 6 (six) hours as needed for nausea or vomiting. 02/28/14   Curt Bears, MD  Rivaroxaban (XARELTO STARTER PACK) 15 & 20 MG TBPK Take as directed on package: Start with one 15mg  tablet by  mouth twice a day with food. Patient not taking: Reported on 09/14/2014 09/09/14   Carlton Adam, PA-C  tiotropium (SPIRIVA) 18 MCG inhalation capsule Place 1 capsule (18 mcg total) into inhaler and inhale daily. Patient not taking: Reported on 09/14/2014 08/18/14   Brand Males, MD  vitamin E (VITAMIN E) 1000 UNIT capsule Take 1,000 Units by mouth daily.    Historical Provider, MD   BP 94/66 mmHg  Pulse 101  Temp(Src) 98.9 F (37.2 C) (Oral)  Resp 30  Ht 5\' 7"  (1.702 m)  SpO2 99% Physical Exam  Constitutional: He is oriented to person, place, and time. He appears well-developed and well-nourished. No distress.  HENT:  Head: Normocephalic and atraumatic.  Right Ear: Hearing normal.  Left Ear: Hearing normal.  Nose: Nose normal.  Mouth/Throat: Oropharynx is clear and moist and mucous membranes are normal.  Eyes: Conjunctivae and EOM are normal. Pupils are equal, round, and reactive to light.  Neck: Normal range of motion. Neck supple.  Cardiovascular: Regular rhythm, S1 normal and S2 normal.  Exam reveals no gallop and no friction rub.   No murmur heard. Pulmonary/Chest: Effort normal and breath sounds normal. No respiratory distress. He exhibits no tenderness.  Abdominal: Soft. Normal appearance and bowel sounds are normal. There is no hepatosplenomegaly. There is no tenderness. There is no rebound, no guarding, no tenderness at McBurney's point and negative Murphy's sign. No hernia.  Musculoskeletal: Normal range of motion.       Right lower leg: He exhibits swelling.  Neurological: He is alert and oriented to person, place, and time. He has normal strength. No cranial nerve deficit or sensory deficit. Coordination normal. GCS eye subscore is 4. GCS verbal subscore is 5. GCS motor subscore is 6.  Skin: Skin is warm, dry and intact. No rash noted. No cyanosis.  Psychiatric: He has a normal mood and affect. His speech is normal and behavior is normal. Thought content normal.   Nursing note and vitals reviewed.   ED Course  Procedures (including critical care time) Labs Review Labs Reviewed  CBC WITH DIFFERENTIAL - Abnormal; Notable for the following:    WBC 1.2 (*)    RBC 2.74 (*)    Hemoglobin 8.5 (*)    HCT 25.3 (*)    RDW 16.5 (*)    Platelets 56 (*)    Neutrophils Relative % 19 (*)    Neutro Abs 0.2 (*)    Lymphs Abs 0.4 (*)    Monocytes Relative 48 (*)    All other components within normal limits  COMPREHENSIVE METABOLIC PANEL - Abnormal; Notable for the following:    Sodium 128 (*)    Potassium 3.2 (*)    Chloride 90 (*)    Glucose, Bld 117 (*)    Total Protein 5.7 (*)    Albumin 2.4 (*)    Total Bilirubin 1.4 (*)  GFR calc non Af Amer 59 (*)    GFR calc Af Amer 69 (*)    All other components within normal limits  URINALYSIS, ROUTINE W REFLEX MICROSCOPIC - Abnormal; Notable for the following:    Glucose, UA 100 (*)    Hgb urine dipstick LARGE (*)    Protein, ur 100 (*)    All other components within normal limits  I-STAT CG4 LACTIC ACID, ED - Abnormal; Notable for the following:    Lactic Acid, Venous 2.63 (*)    All other components within normal limits  TROPONIN I  URINE MICROSCOPIC-ADD ON  PATHOLOGIST SMEAR REVIEW    Imaging Review Dg Chest 2 View  09/14/2014   CLINICAL DATA:  Recent falls due to weakness. History of lung cancer.  EXAM: CHEST  2 VIEW  COMPARISON:  Chest CT 06/1914  FINDINGS: Port-A-Cath tip in the lower SVC region. Chronic changes at the lung bases compatible with bronchiectasis. New densities in the right mid lung are suggestive for fluid or densities in the right minor fissure. Heart size is normal. Negative for pneumothorax. Bony structures appear to be intact.  IMPRESSION: There is new fluid or densities in the right minor fissure region.  Chronic changes and bronchiectasis at the lung bases.   Electronically Signed   By: Markus Daft M.D.   On: 09/14/2014 20:18     EKG Interpretation   Date/Time:   Wednesday September 14 2014 18:51:44 EST Ventricular Rate:  117 PR Interval:  145 QRS Duration: 70 QT Interval:  357 QTC Calculation: 498 R Axis:   30 Text Interpretation:  Sinus tachycardia with PACs Low voltage, extremity  and precordial leads Prolonged QT interval No previous tracing Confirmed  by Tarin Johndrow  MD, [place] [phone]) on 09/14/2014 7:07:07 PM      MDM   Final diagnoses:  Weakness   Patient presents to the ER for evaluation of generalized weakness. Patient is currently undergoing chemotherapy for metastatic esophageal cancer, last dose 10 days ago.patient has noticed progressively worsening generalized weakness after this treatment. He was recently diagnosed with a DVT in his right leg, started on Xarelto. Patient has had 2 minor falls in the last 24 hours due to his weakness. I am concerned about his current living conditions with his generalized weakness. He is living alone and is at significant risk for injury from fall. Patient was mildly orthostatic here in the ER, likely dehydrated from not eating and drinking. He was given IV fluids but his orthostasis did not significantly improve. Patient is unable to stand on his own at the bedside without falling backwards. He will require hospitalization for further management.    Orpah Greek, MD 09/14/14 [phone]

## 2014-09-14 NOTE — ED Notes (Signed)
Writer notified EDP of abnormal I-Stat lactic acid result.

## 2014-09-14 NOTE — ED Notes (Signed)
Case management in to speak with patient

## 2014-09-14 NOTE — ED Notes (Signed)
MD Pollina notified WBC 1.2.  Case management contacted regarding home care for tomorrow if patient is discharged.

## 2014-09-14 NOTE — ED Notes (Addendum)
Patient report aligns with EMS report. Denies chest pain, SOB, N, V, D.  Was recently diagnosed with blood clot in right lung. On blood thinners. No other questions/concerns.

## 2014-09-15 DIAGNOSIS — I5031 Acute diastolic (congestive) heart failure: Secondary | ICD-10-CM | POA: Diagnosis present

## 2014-09-15 DIAGNOSIS — I82409 Acute embolism and thrombosis of unspecified deep veins of unspecified lower extremity: Secondary | ICD-10-CM

## 2014-09-15 DIAGNOSIS — Z923 Personal history of irradiation: Secondary | ICD-10-CM | POA: Diagnosis not present

## 2014-09-15 DIAGNOSIS — J449 Chronic obstructive pulmonary disease, unspecified: Secondary | ICD-10-CM | POA: Diagnosis present

## 2014-09-15 DIAGNOSIS — E860 Dehydration: Secondary | ICD-10-CM | POA: Diagnosis present

## 2014-09-15 DIAGNOSIS — R54 Age-related physical debility: Secondary | ICD-10-CM | POA: Diagnosis present

## 2014-09-15 DIAGNOSIS — C159 Malignant neoplasm of esophagus, unspecified: Secondary | ICD-10-CM

## 2014-09-15 DIAGNOSIS — N390 Urinary tract infection, site not specified: Secondary | ICD-10-CM | POA: Diagnosis present

## 2014-09-15 DIAGNOSIS — R531 Weakness: Secondary | ICD-10-CM | POA: Diagnosis present

## 2014-09-15 DIAGNOSIS — Z85118 Personal history of other malignant neoplasm of bronchus and lung: Secondary | ICD-10-CM | POA: Diagnosis not present

## 2014-09-15 DIAGNOSIS — E785 Hyperlipidemia, unspecified: Secondary | ICD-10-CM | POA: Diagnosis present

## 2014-09-15 DIAGNOSIS — E876 Hypokalemia: Secondary | ICD-10-CM | POA: Diagnosis present

## 2014-09-15 DIAGNOSIS — Z7901 Long term (current) use of anticoagulants: Secondary | ICD-10-CM | POA: Diagnosis not present

## 2014-09-15 DIAGNOSIS — E43 Unspecified severe protein-calorie malnutrition: Secondary | ICD-10-CM | POA: Diagnosis present

## 2014-09-15 DIAGNOSIS — J849 Interstitial pulmonary disease, unspecified: Secondary | ICD-10-CM | POA: Diagnosis present

## 2014-09-15 DIAGNOSIS — I10 Essential (primary) hypertension: Secondary | ICD-10-CM

## 2014-09-15 DIAGNOSIS — I4891 Unspecified atrial fibrillation: Secondary | ICD-10-CM | POA: Diagnosis present

## 2014-09-15 DIAGNOSIS — D61810 Antineoplastic chemotherapy induced pancytopenia: Secondary | ICD-10-CM | POA: Diagnosis present

## 2014-09-15 DIAGNOSIS — K219 Gastro-esophageal reflux disease without esophagitis: Secondary | ICD-10-CM | POA: Diagnosis present

## 2014-09-15 DIAGNOSIS — C155 Malignant neoplasm of lower third of esophagus: Secondary | ICD-10-CM | POA: Diagnosis present

## 2014-09-15 DIAGNOSIS — R627 Adult failure to thrive: Secondary | ICD-10-CM | POA: Diagnosis present

## 2014-09-15 DIAGNOSIS — D61818 Other pancytopenia: Secondary | ICD-10-CM | POA: Diagnosis present

## 2014-09-15 DIAGNOSIS — Z6829 Body mass index (BMI) 29.0-29.9, adult: Secondary | ICD-10-CM | POA: Diagnosis not present

## 2014-09-15 DIAGNOSIS — Z86718 Personal history of other venous thrombosis and embolism: Secondary | ICD-10-CM | POA: Diagnosis not present

## 2014-09-15 DIAGNOSIS — T451X5A Adverse effect of antineoplastic and immunosuppressive drugs, initial encounter: Secondary | ICD-10-CM | POA: Diagnosis present

## 2014-09-15 DIAGNOSIS — Z79899 Other long term (current) drug therapy: Secondary | ICD-10-CM | POA: Diagnosis not present

## 2014-09-15 DIAGNOSIS — K2270 Barrett's esophagus without dysplasia: Secondary | ICD-10-CM | POA: Diagnosis present

## 2014-09-15 DIAGNOSIS — Z87891 Personal history of nicotine dependence: Secondary | ICD-10-CM | POA: Diagnosis not present

## 2014-09-15 DIAGNOSIS — I82401 Acute embolism and thrombosis of unspecified deep veins of right lower extremity: Secondary | ICD-10-CM

## 2014-09-15 LAB — CBC
HCT: 23.3 % — ABNORMAL LOW (ref 39.0–52.0)
Hemoglobin: 7.7 g/dL — ABNORMAL LOW (ref 13.0–17.0)
MCH: 30.6 pg (ref 26.0–34.0)
MCHC: 33 g/dL (ref 30.0–36.0)
MCV: 92.5 fL (ref 78.0–100.0)
PLATELETS: 48 10*3/uL — AB (ref 150–400)
RBC: 2.52 MIL/uL — AB (ref 4.22–5.81)
RDW: 16.6 % — ABNORMAL HIGH (ref 11.5–15.5)
WBC: 1.8 10*3/uL — AB (ref 4.0–10.5)

## 2014-09-15 LAB — CLOSTRIDIUM DIFFICILE BY PCR: Toxigenic C. Difficile by PCR: NEGATIVE

## 2014-09-15 LAB — BASIC METABOLIC PANEL
Anion gap: 12 (ref 5–15)
BUN: 20 mg/dL (ref 6–23)
CO2: 24 mEq/L (ref 19–32)
Calcium: 7.6 mg/dL — ABNORMAL LOW (ref 8.4–10.5)
Chloride: 95 mEq/L — ABNORMAL LOW (ref 96–112)
Creatinine, Ser: 1.1 mg/dL (ref 0.50–1.35)
GFR, EST AFRICAN AMERICAN: 78 mL/min — AB (ref >90)
GFR, EST NON AFRICAN AMERICAN: 67 mL/min — AB (ref >90)
GLUCOSE: 98 mg/dL (ref 70–99)
POTASSIUM: 3.4 meq/L — AB (ref 3.7–5.3)
Sodium: 131 mEq/L — ABNORMAL LOW (ref 137–147)

## 2014-09-15 LAB — PRO B NATRIURETIC PEPTIDE: PRO B NATRI PEPTIDE: 2151 pg/mL — AB (ref 0–125)

## 2014-09-15 LAB — GLUCOSE, CAPILLARY: GLUCOSE-CAPILLARY: 107 mg/dL — AB (ref 70–99)

## 2014-09-15 LAB — LACTIC ACID, PLASMA: LACTIC ACID, VENOUS: 1.1 mmol/L (ref 0.5–2.2)

## 2014-09-15 MED ORDER — BIOTENE DRY MOUTH MT LIQD: 15.0000 mL | OROMUCOSAL | Status: DC | PRN

## 2014-09-15 MED ORDER — MAGNESIUM OXIDE 400 (241.3 MG) MG PO TABS
400.0000 mg | ORAL_TABLET | Freq: Two times a day (BID) | ORAL | Status: DC
Start: 2014-09-15 — End: 2014-09-19
  Administered 2014-09-15 – 2014-09-19 (×10): 400 mg via ORAL
  Filled 2014-09-15 (×11): qty 1

## 2014-09-15 MED ORDER — MEGESTROL ACETATE 40 MG/ML PO SUSP
400.0000 mg | Freq: Every day | ORAL | Status: DC
Start: 2014-09-15 — End: 2014-09-19
  Administered 2014-09-15 – 2014-09-19 (×5): 400 mg via ORAL
  Filled 2014-09-15 (×6): qty 10

## 2014-09-15 MED ORDER — LIDOCAINE-PRILOCAINE 2.5-2.5 % EX CREA
1.0000 "application " | TOPICAL_CREAM | CUTANEOUS | Status: DC | PRN
  Filled 2014-09-15: qty 5

## 2014-09-15 MED ORDER — HYDROCHLOROTHIAZIDE 25 MG PO TABS
25.0000 mg | ORAL_TABLET | Freq: Every day | ORAL | Status: DC | PRN
  Filled 2014-09-15: qty 1

## 2014-09-15 MED ORDER — SODIUM CHLORIDE 0.9 % IV SOLN
INTRAVENOUS | Status: DC
  Administered 2014-09-15 – 2014-09-16 (×3): via INTRAVENOUS

## 2014-09-15 MED ORDER — MAGNESIUM HYDROXIDE 400 MG/5ML PO SUSP: 5.0000 mL | Freq: Every day | ORAL | Status: DC | PRN

## 2014-09-15 MED ORDER — VITAMIN E 45 MG (100 UNIT) PO CAPS
1000.0000 [IU] | ORAL_CAPSULE | Freq: Every day | ORAL | Status: DC
  Administered 2014-09-15 – 2014-09-19 (×5): 1000 [IU] via ORAL
  Filled 2014-09-15 (×5): qty 2

## 2014-09-15 MED ORDER — TRAMADOL HCL 50 MG PO TABS
50.0000 mg | ORAL_TABLET | Freq: Four times a day (QID) | ORAL | Status: DC | PRN
  Administered 2014-09-16: 50 mg via ORAL
  Filled 2014-09-15: qty 1

## 2014-09-15 MED ORDER — VALSARTAN-HYDROCHLOROTHIAZIDE 160-25 MG PO TABS: 1.0000 | ORAL_TABLET | Freq: Every day | ORAL | Status: DC | PRN

## 2014-09-15 MED ORDER — ENSURE COMPLETE PO LIQD: 237.0000 mL | Freq: Two times a day (BID) | ORAL | Status: DC

## 2014-09-15 MED ORDER — DUTASTERIDE 0.5 MG PO CAPS
0.5000 mg | ORAL_CAPSULE | Freq: Every evening | ORAL | Status: DC
  Administered 2014-09-15 – 2014-09-18 (×4): 0.5 mg via ORAL
  Filled 2014-09-15 (×5): qty 1

## 2014-09-15 MED ORDER — BOOST PLUS PO LIQD
237.0000 mL | Freq: Two times a day (BID) | ORAL | Status: DC
  Administered 2014-09-15 – 2014-09-18 (×4): 237 mL via ORAL
  Filled 2014-09-15 (×9): qty 237

## 2014-09-15 MED ORDER — PROCHLORPERAZINE MALEATE 10 MG PO TABS
10.0000 mg | ORAL_TABLET | Freq: Four times a day (QID) | ORAL | Status: DC | PRN
  Filled 2014-09-15: qty 1

## 2014-09-15 MED ORDER — RIVAROXABAN 15 MG PO TABS
15.0000 mg | ORAL_TABLET | Freq: Two times a day (BID) | ORAL | Status: DC
  Administered 2014-09-15 – 2014-09-19 (×9): 15 mg via ORAL
  Filled 2014-09-15 (×14): qty 1

## 2014-09-15 MED ORDER — PANTOPRAZOLE SODIUM 40 MG PO TBEC
40.0000 mg | DELAYED_RELEASE_TABLET | Freq: Every day | ORAL | Status: DC
  Administered 2014-09-15 – 2014-09-19 (×5): 40 mg via ORAL
  Filled 2014-09-15 (×5): qty 1

## 2014-09-15 MED ORDER — TIOTROPIUM BROMIDE MONOHYDRATE 18 MCG IN CAPS
18.0000 ug | ORAL_CAPSULE | Freq: Every day | RESPIRATORY_TRACT | Status: DC
Start: 2014-09-15 — End: 2014-09-19
  Administered 2014-09-15 – 2014-09-19 (×5): 18 ug via RESPIRATORY_TRACT
  Filled 2014-09-15: qty 5

## 2014-09-15 MED ORDER — GARLIC 1000 MG PO CAPS: 1.0000 | ORAL_CAPSULE | Freq: Every day | ORAL | Status: DC

## 2014-09-15 MED ORDER — IRBESARTAN 150 MG PO TABS
150.0000 mg | ORAL_TABLET | Freq: Every day | ORAL | Status: DC | PRN
Start: 2014-09-15 — End: 2014-09-19
  Filled 2014-09-15: qty 1

## 2014-09-15 NOTE — Evaluation (Signed)
Physical Therapy Evaluation Patient Details Name: [name] [name] MRN: [id number] DOB: 01-06-1946 Today's Date: 09/15/2014   History of Present Illness  Pt is a 68 year old with past medical history of esophageal carcinoma currently on chemotherapy as well as emphysema, HTN admitted for weakness and unable to stand.  Clinical Impression  Pt admitted with above diagnosis. Pt currently with functional limitations due to the deficits listed below (see PT Problem List).  Pt will benefit from skilled PT to increase their independence and safety with mobility to allow discharge to the venue listed below.  Pt only able to tolerate short distance ambulation today and presents with weakness and unsteadiness.  Recommend SNF upon d/c however pt may not be agreeable.  If home, HHPT and RW.     Follow Up Recommendations SNF;Supervision/Assistance - 24 hour    Equipment Recommendations  Rolling walker with 5" wheels    Recommendations for Other Services       Precautions / Restrictions Precautions Precautions: Fall      Mobility  Bed Mobility Overal bed mobility: Needs Assistance Bed Mobility: Supine to Sit;Sit to Supine     Supine to sit: Mod assist Sit to supine: Min assist   General bed mobility comments: increased assist for trunk upright   Transfers Overall transfer level: Needs assistance Equipment used: Rolling walker (2 wheeled) Transfers: Sit to/from Stand Sit to Stand: Min assist         General transfer comment: assist to rise and steady, kept hands on RW  Ambulation/Gait Ambulation/Gait assistance: Min assist Ambulation Distance (Feet): 30 Feet Assistive device: Rolling walker (2 wheeled) Gait Pattern/deviations: Step-through pattern;Trunk flexed     General Gait Details: pt required assist to steady, limited distance due to ?fatigue and weakness, pt states he could have ambulated farther with cane usually however liked the RW  Stairs            Wheelchair  Mobility    Modified Rankin (Stroke Patients Only)       Balance                                             Pertinent Vitals/Pain Pain Assessment: No/denies pain    Home Living Family/patient expects to be discharged to:: Private residence Living Arrangements: Alone Available Help at Discharge: Family;Friend(s) Type of Home: House Home Access: Stairs to enter   [employer] of Steps: 2 front Home Layout: One level Home Equipment: Cane - single point      Prior Function Level of Independence: Independent with assistive device(s)               Hand Dominance   Dominant Hand: Right    Extremity/Trunk Assessment               Lower Extremity Assessment: Generalized weakness         Communication   Communication: No difficulties  Cognition Arousal/Alertness: Awake/alert Behavior During Therapy: WFL for tasks assessed/performed Overall Cognitive Status: Within Functional Limits for tasks assessed                      General Comments      Exercises        Assessment/Plan    PT Assessment Patient needs continued PT services  PT Diagnosis Generalized weakness;Difficulty walking   PT Problem List Decreased strength;Decreased activity tolerance;Decreased balance;Decreased mobility;Decreased knowledge  of use of DME  PT Treatment Interventions DME instruction;Gait training;Functional mobility training;Therapeutic activities;Therapeutic exercise;Patient/family education   PT Goals (Current goals can be found in the Care Plan section) Acute Rehab PT Goals PT Goal Formulation: With patient Time For Goal Achievement: 09/29/14 Potential to Achieve Goals: Good    Frequency Min 3X/week   Barriers to discharge        Co-evaluation               End of Session Equipment Utilized During Treatment: Gait belt Activity Tolerance: Patient limited by fatigue Patient left: in bed;with call bell/phone within  reach;with bed alarm set           Time: 1357-1409 PT Time Calculation (min) (ACUTE ONLY): 12 min   Charges:   PT Evaluation $Initial PT Evaluation Tier I: 1 Procedure PT Treatments $Gait Training: 8-22 mins   PT G Codes:          [name],[name] 09/15/2014, 3:58 PM [name], PT, DPT 09/15/2014 Pager: [phone]

## 2014-09-15 NOTE — Progress Notes (Signed)
PROGRESS NOTE  Knox Cervi ELF:[id number] DOB: November 02, 1945 DOA: 09/14/2014 PCP: [name], MD  HPI/Recap of past 4 hours: 68 year old with past medical history of esophageal carcinoma currently on chemotherapy as well as emphysema admitted earlier this morning for weakness and unable to stand. Patient reports some episodes of loose stools, but he says he has had such poor by mouth intake that point in that he can take is boost shakes. Lab work notable for mildly elevated lactic acid level which soon resolved and also pancytopenia. Patient was admitted for failure to thrive, following admission, BNP came back elevated at 2151, with patient having no history of CHF.  Assessment/Plan: Principal Problem:   Generalized weakness: Likely overall deconditioning and weakness. Have added Megace. Underlying heart failure may be a contributing factor. See below. Initial diarrhea is likely more just from liquid diet, C. difficile negative  . Malignant neoplasm of lower third of esophagus: Currently getting chemotherapy  . elevated BNP in the setting of ILD (interstitial lung disease)/centrilobular emphysema: This part is stable, although he may have cor pulmonale causing congestive heart failure. Will check 2-D echocardiogram, none on record  . Essential hypertension, benign: Stable   . DVT (deep venous thrombosis): On Xarelto  . Hypokalemia: Replacing accordingly   . Protein-calorie malnutrition, severe: Patient meets criteria for severe malnutrition in the context of chronic illness as evidenced by by mouth intake of less than 50% for 1 month, 6% weight loss in one month . Appreciate nutrition consult. Patient started on Ensure Plus Magic cup plus Biotene  . Other pancytopenia: Secondary to chemotherapy. Not neutropenic. Monitor hemoglobin and transfuse if falls below 7.  Code Status: Full code  Family Communication: Left message with family  Disposition Plan: If patient indeed does  have heart failure, here until fully diuresed   Consultants:  Oncology notified about patient's admission  Procedures: 2-D echocardiogram ordered and pending Antibiotics:  None   Objective: BP 103/63 mmHg  Pulse 95  Temp(Src) 98.1 F (36.7 C) (Oral)  Resp 18  Ht 5\' 7"  (1.702 m)  Wt 85.367 kg (188 lb 3.2 oz)  BMI 29.47 kg/m2  SpO2 99%  Intake/Output Summary (Last 24 hours) at 09/15/14 1500 Last data filed at 09/15/14 1424  Gross per 24 hour  Intake 873.33 ml  Output   1051 ml  Net -177.67 ml   Filed Weights   09/15/14 0100  Weight: 85.367 kg (188 lb 3.2 oz)    Exam:  Gen.: Alert and oriented 3, fatigued CV: Regular rate and rhythm, H8-N2, 2/6 systolic ejection murmur Lungs: Decreased breath sounds throughout, prolonged expiratory phase Abd: Soft, nontender, nondistended, hypoactive bowel sounds Ext: No clubbing or cyanosis or edema     Data Reviewed: Basic Metabolic Panel:  Recent Labs Lab 09/09/14 1020 09/14/14 1932 09/15/14 0430  NA 137 128* 131*  K 3.9 3.2* 3.4*  CL  --  90* 95*  CO2 27 24 24   GLUCOSE 113 117* 98  BUN 32.1* 20 20  CREATININE 1.0 1.22 1.10  CALCIUM 8.3* 8.4 7.6*  MG 1.9  --   --    Liver Function Tests:  Recent Labs Lab 09/09/14 1020 09/14/14 1932  AST 37* 33  ALT 35 25  ALKPHOS 65 54  BILITOT 0.50 1.4*  PROT 5.4* 5.7*  ALBUMIN 2.6* 2.4*   No results for input(s): LIPASE, AMYLASE in the last 168 hours. No results for input(s): AMMONIA in the last 168 hours. CBC:  Recent Labs Lab 09/09/14 1021 09/14/14 1932  09/15/14 0430  WBC 1.6* 1.2* 1.8*  NEUTROABS 1.1* 0.2*  --   HGB 9.9* 8.5* 7.7*  HCT 31.0* 25.3* 23.3*  MCV 95.2 92.3 92.5  PLT 236 56* 48*   Cardiac Enzymes:    Recent Labs Lab 09/14/14 1932  TROPONINI <0.30   BNP (last 3 results)  Recent Labs  09/15/14 1320  PROBNP 2151.0*   CBG:  Recent Labs Lab 09/15/14 0744  GLUCAP 107*    Recent Results (from the past 240 hour(s))    Clostridium Difficile by PCR     Status: None   Collection Time: 09/15/14  8:59 AM  Result Value Ref Range Status   C difficile by pcr NEGATIVE NEGATIVE Final    Comment: Performed at [facility]     Studies: Dg Chest 2 View  09/14/2014   CLINICAL DATA:  Recent falls due to weakness. History of lung cancer.  EXAM: CHEST  2 VIEW  COMPARISON:  Chest CT 06/1914  FINDINGS: Port-A-Cath tip in the lower SVC region. Chronic changes at the lung bases compatible with bronchiectasis. New densities in the right mid lung are suggestive for fluid or densities in the right minor fissure. Heart size is normal. Negative for pneumothorax. Bony structures appear to be intact.  IMPRESSION: There is new fluid or densities in the right minor fissure region.  Chronic changes and bronchiectasis at the lung bases.   Electronically Signed   By: Markus Daft M.D.   On: 09/14/2014 20:18    Scheduled Meds: . dutasteride  0.5 mg Oral QPM  . lactose free nutrition  237 mL Oral BID BM  . magnesium oxide  400 mg Oral BID  . megestrol  400 mg Oral Daily  . pantoprazole  40 mg Oral Daily  . Rivaroxaban  15 mg Oral BID WC  . tiotropium  18 mcg Inhalation Daily  . vitamin E  1,000 Units Oral Daily    Continuous Infusions: . sodium chloride 125 mL/hr at 09/15/14 0056     Time spent: 25 minutes  [facility] Pager [phone]. If 7PM-7AM, please contact night-coverage at [email or web address], password [facility] 09/15/2014, 3:00 PM  LOS: 1 day

## 2014-09-15 NOTE — Progress Notes (Signed)
PHARMACIST - PHYSICIAN ORDER COMMUNICATION  CONCERNING: P&T Medication Policy on Herbal Medications  DESCRIPTION:  This patient's order for:  Garlic  has been noted.  This product(s) is classified as an "herbal" or natural product. Due to a lack of definitive safety studies or FDA approval, nonstandard manufacturing practices, plus the potential risk of unknown drug-drug interactions while on inpatient medications, the Pharmacy and Therapeutics Committee does not permit the use of "herbal" or natural products of this type within [facility].   ACTION TAKEN: The pharmacy department is unable to verify this order at this time and your patient has been informed of this safety policy. Please reevaluate patient's clinical condition at discharge and address if the herbal or natural product(s) should be resumed at that time.   Thanks! Dorrene German 09/15/2014 12:57 AM

## 2014-09-15 NOTE — H&P (Signed)
[facility] History and Physical  [name] [name] YJE:[id number] DOB: 1946/07/03 DOA: 09/14/2014  Referring physician: ED physician PCP: [name], MD  Specialists:   Chief Complaint: Generalized weakness.  HPI: [name] [name] is a 68 y.o. 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  Patient reports that he is receiving chemotherapy for esophageal cancer. Last dose was on 09/12/14. He is s/p of status of radiotherapy. In the past several days, he has progressively worsening weakness. He has very poor appetite. Yesterday, he slipped from chair to the floor, and could not stand up by himself. He does not have nausea, vomiting, diarrhea, abdominal pain. Patient does not have fever, chills, cough, chest pain, shortness of breath.  Work up in the ED demonstrates activity UTI, lactate 2.63, negative troponin. Potassium 3.2. Chest x-ray showed right minor fissure fluid accumulation, no infiltration.  Review of Systems: As presented in the history of presenting illness, rest negative.  Where does patient live?  At home Can patient participate in ADLs? none  Allergy: No Known Allergies  Past Medical History  Diagnosis Date  . GERD (gastroesophageal reflux disease)   . Hyperlipemia   . Barrett's esophagus 12/2013  . History of radiation therapy 02/23/14- 04/04/14    esophagus 5040 cGy 28 sessions  . 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 Hypertension     Past Surgical History  Procedure Laterality Date  . Eus N/A 02/03/2014    Procedure: UPPER ENDOSCOPIC ULTRASOUND (EUS) LINEAR;  Surgeon: Milus Banister, MD;  Location: [facility];  Service: Endoscopy;  Laterality: N/A;    Social History:  reports that he quit smoking about 25 years ago. His smoking use included Cigarettes. He has a 70 pack-year smoking history. He has never used smokeless tobacco. He reports that he  drinks alcohol. He reports that he does not use illicit drugs.  Family History:  Family History  Problem Relation Age of Onset  . Breast cancer Sister      Prior to Admission medications   Medication Sig Start Date End Date Taking? Authorizing Provider  dexamethasone (DECADRON) 4 MG tablet 2 tab po bid the day before, day of and day after chemo every 3 weeks. 06/13/14  Yes Drue Second, NP  dutasteride (AVODART) 0.5 MG capsule Take 0.5 mg by mouth every evening.    Yes Historical Provider, MD  lidocaine-prilocaine (EMLA) cream Apply 1 application topically as needed. 05/18/14  Yes Curt Bears, MD  magnesium oxide (MAG-OX) 400 (241.3 MG) MG tablet TAKE 1 TABLET BY MOUTH TWICE A DAY 08/29/14  Yes Curt Bears, MD  omeprazole (PRILOSEC) 40 MG capsule Take 40 mg by mouth daily.   Yes Historical Provider, MD  Rivaroxaban (XARELTO) 15 MG TABS tablet Take 15 mg by mouth 2 (two) times daily with a meal.   Yes Historical Provider, MD  traMADol (ULTRAM) 50 MG tablet Take 1 tablet (50 mg total) by mouth every 6 (six) hours as needed. 09/09/14  Yes Adrena E Johnson, PA-C  valsartan-hydrochlorothiazide (DIOVAN-HCT) 160-25 MG per tablet Take 1 tablet by mouth daily as needed (If blood pressure is greater than 130/80).    Yes Historical Provider, MD  Garlic 6378 MG CAPS Take 1 capsule by mouth daily.    Historical Provider, MD  magnesium hydroxide (MILK OF MAGNESIA) 400 MG/5ML suspension Take 5 mLs by mouth daily as needed for mild constipation.    Historical Provider, MD  prochlorperazine (COMPAZINE)  10 MG tablet Take 1 tablet (10 mg total) by mouth every 6 (six) hours as needed for nausea or vomiting. 02/28/14   Curt Bears, MD  Rivaroxaban (XARELTO STARTER PACK) 15 & 20 MG TBPK Take as directed on package: Start with one 15mg  tablet by mouth twice a day with food. Patient not taking: Reported on 09/14/2014 09/09/14   Carlton Adam, PA-C  tiotropium (SPIRIVA) 18 MCG inhalation capsule Place 1  capsule (18 mcg total) into inhaler and inhale daily. Patient not taking: Reported on 09/14/2014 08/18/14   Brand Males, MD  vitamin E (VITAMIN E) 1000 UNIT capsule Take 1,000 Units by mouth daily.    Historical Provider, MD    Physical Exam: Filed Vitals:   09/14/14 2216 09/14/14 2310 09/15/14 0100 09/15/14 0459  BP:  94/66 106/63 103/61  Pulse: 104 101 95 98  Temp:   97.9 F (36.6 C) 98.7 F (37.1 C)  TempSrc:   Oral Oral  Resp: 23 30 24 20   Height:   5\' 7"  (1.702 m)   Weight:   85.367 kg (188 lb 3.2 oz)   SpO2: 99% 99% 99% 100%   General: Not in acute distress, dry mucous and membrane HEENT:       Eyes: PERRL, EOMI, no scleral icterus       ENT: No discharge from the ears and nose, no pharynx injection, no tonsillar enlargement.        Neck: No JVD, no bruit, no mass felt. Cardiac: S1/S2, RRR, No murmurs, No gallops or rubs Pulm: Good air movement bilaterally. Clear to auscultation bilaterally. No rales, wheezing, rhonchi or rubs. Abd: Soft, nondistended, nontender, no rebound pain, no organomegaly, BS present Ext: No edema bilaterally. 2+DP/PT pulse bilaterally Musculoskeletal: No joint deformities, erythema, or stiffness, ROM full Skin: No rashes.  Neuro: Alert and oriented X3, cranial nerves II-XII grossly intact, muscle strength 5/5 in all extremeties, sensation to light touch intact.  Psych: Patient is not psychotic, no suicidal or hemocidal ideation.  Labs on Admission:  Basic Metabolic Panel:  Recent Labs Lab 09/09/14 1020 09/14/14 1932 09/15/14 0430  NA 137 128* 131*  K 3.9 3.2* 3.4*  CL  --  90* 95*  CO2 27 24 24   GLUCOSE 113 117* 98  BUN 32.1* 20 20  CREATININE 1.0 1.22 1.10  CALCIUM 8.3* 8.4 7.6*  MG 1.9  --   --    Liver Function Tests:  Recent Labs Lab 09/09/14 1020 09/14/14 1932  AST 37* 33  ALT 35 25  ALKPHOS 65 54  BILITOT 0.50 1.4*  PROT 5.4* 5.7*  ALBUMIN 2.6* 2.4*   No results for input(s): LIPASE, AMYLASE in the last 168  hours. No results for input(s): AMMONIA in the last 168 hours. CBC:  Recent Labs Lab 09/09/14 1021 09/14/14 1932 09/15/14 0430  WBC 1.6* 1.2* 1.8*  NEUTROABS 1.1* 0.2*  --   HGB 9.9* 8.5* 7.7*  HCT 31.0* 25.3* 23.3*  MCV 95.2 92.3 92.5  PLT 236 56* 48*   Cardiac Enzymes:  Recent Labs Lab 09/14/14 1932  TROPONINI <0.30    BNP (last 3 results) No results for input(s): PROBNP in the last 8760 hours. CBG: No results for input(s): GLUCAP in the last 168 hours.  Radiological Exams on Admission: Dg Chest 2 View  09/14/2014   CLINICAL DATA:  Recent falls due to weakness. History of lung cancer.  EXAM: CHEST  2 VIEW  COMPARISON:  Chest CT 06/1914  FINDINGS: Port-A-Cath tip in the  lower SVC region. Chronic changes at the lung bases compatible with bronchiectasis. New densities in the right mid lung are suggestive for fluid or densities in the right minor fissure. Heart size is normal. Negative for pneumothorax. Bony structures appear to be intact.  IMPRESSION: There is new fluid or densities in the right minor fissure region.  Chronic changes and bronchiectasis at the lung bases.   Electronically Signed   By: Markus Daft M.D.   On: 09/14/2014 20:18    EKG: Independently reviewed.   Assessment/Plan Principal Problem:   Generalized weakness Active Problems:   Essential hypertension, benign   Esophageal carcinoma   Malignant neoplasm of lower third of esophagus   ILD (interstitial lung disease)   Centrilobular emphysema   DVT (deep venous thrombosis)   Hypokalemia  Generalized weakness: Most likely due to chemotherapy and poor oral intake secondary to decreased appetite. Her electrolytes is okay on admission. Patient is clinically dehydrated. -will admit to med-surg bed -IVF: NS 125 cc/h -oral Ensure  Hypokalemia: Potassium is 3.2 on admission. -Repleted.  Esophageal cancer: Has been treated by [name]. He is post status of radiation therapy. Currently is getting  chemotherapy. -will follow up with oncologist at discharge.  DVT: on Xareto, no bleeding tendency -continue Xarelto  HTN: on Diovan-HCTZ at home -will switch to irbesartan  GERD: -protonix  Mulnutrition: -Ensure   DVT ppx: SQ Heparin   Code Status: Full code Family Communication:    Yes, patient's  granddaughter     at bed side Disposition Plan: Admit to inpatient   Date of Service 09/15/2014    Ivor Costa [facility] Pager [phone]  If 7PM-7AM, please contact night-coverage [email or web address] Password [email or web address] 09/15/2014, 7:06 AM

## 2014-09-15 NOTE — Evaluation (Signed)
Occupational Therapy Evaluation Patient Details Name: [name] [name] MRN: [id number] DOB: Feb 22, 1946 Today's Date: 09/15/2014    History of Present Illness Pt admitted with  Generalized weakness after not being able to get up from chair   Clinical Impression   Pt admit with weakness Pt will benefit from skilled OT to increase their safety and independence with ADL and functional mobility for ADL to facilitate discharge to venue listed below.      Follow Up Recommendations  SNF;Other (comment) (SNF if pt does not have 24/7 care initially- pt will likely refuse)    Equipment Recommendations  3 in 1 bedside comode;Other (comment) (walker Rolling)    Recommendations for Other Services       Precautions / Restrictions Precautions Precautions: Fall      Mobility Bed Mobility Overal bed mobility: Needs Assistance Bed Mobility: Supine to Sit     Supine to sit: Min assist     General bed mobility comments: increased time  Transfers Overall transfer level: Needs assistance Equipment used: Rolling walker (2 wheeled) Transfers: Sit to/from Stand           General transfer comment: increassed time    Balance Overall balance assessment: Needs assistance;History of Falls Sitting-balance support: No upper extremity supported Sitting balance-Leahy Scale: Good     Standing balance support: Bilateral upper extremity supported Standing balance-Leahy Scale: Fair                              ADL Overall ADL's : Needs assistance/impaired Eating/Feeding: Supervision/ safety;Sitting   Grooming: Set up;Sitting   Upper Body Bathing: Minimal assitance;Sitting   Lower Body Bathing: Sit to/from stand;Moderate assistance   Upper Body Dressing : Set up;Sitting   Lower Body Dressing: Sit to/from stand;Maximal assistance   Toilet Transfer: RW;Minimal Patent examiner Details (indicate cue type and reason): increased time Toileting- Marine scientist and Hygiene: Sit to/from stand;Moderate assistance   Tub/ Shower Transfer: Moderate assistance     General ADL Comments: pt will need increased A with ADL activity at home. pt does not have 24/7 A per RN     Vision                        Pertinent Vitals/Pain Pain Assessment: No/denies pain     Hand Dominance Right   Extremity/Trunk Assessment Upper Extremity Assessment Upper Extremity Assessment: Generalized weakness           Communication Communication Communication: No difficulties   Cognition Arousal/Alertness: Awake/alert Behavior During Therapy: WFL for tasks assessed/performed Overall Cognitive Status: Within Functional Limits for tasks assessed                           Shoulder Instructions      Home Living Family/patient expects to be discharged to:: Private residence Living Arrangements: Alone Available Help at Discharge: Family;Friend(s) Type of Home: House Home Access: Stairs to enter [employer] of Steps: 2 front   Home Layout: One level     Bathroom Shower/Tub: Occupational psychologist: Standard     Home Equipment: Cane - single point          Prior Functioning/Environment Level of Independence: Independent             OT Diagnosis: Generalized weakness   OT Problem List: Decreased strength;Decreased activity tolerance   OT Treatment/Interventions: Self-care/ADL training;DME and/or  AE instruction;Patient/family education    OT Goals(Current goals can be found in the care plan section) Acute Rehab OT Goals Patient Stated Goal: go back home and not need help OT Goal Formulation: With patient Time For Goal Achievement: 09/29/14 Potential to Achieve Goals: Good  OT Frequency: Min 2X/week              End of Session    Activity Tolerance: Patient tolerated treatment well Patient left: in bed   Time: 1041-1105 OT Time Calculation (min): 24 min Charges:  OT General  Charges $OT Visit: 1 Procedure OT Evaluation $Initial OT Evaluation Tier I: 1 Procedure OT Treatments $Self Care/Home Management : 8-22 mins G-Codes:    Payton Mccallum D 09-27-2014, 11:10 AM

## 2014-09-15 NOTE — Progress Notes (Signed)
Chaplain assisted with advance directive / Healthcare power of attorney.    Pt's granddaughter named HCPOA.  Pt oriented and voiced understanding of document.   Contacted A/C to notarize   [place], [place]

## 2014-09-15 NOTE — Progress Notes (Signed)
CARE MANAGEMENT NOTE 09/15/2014  Patient:  [name] [name]   Account Number:  [id number]  Date Initiated:  09/15/2014  Documentation initiated by:  Olga Coaster  Subjective/Objective Assessment:   ADMITTED WITH WEAKNESS,LUNG CANCER     Action/Plan:   CM FOLLOWING FOR DCP   Anticipated DC Date:  09/20/2014   Anticipated DC Plan:  [place]  CM consult        Status of service:  In process, will continue to follow Medicare Important Message given?   (If response is "NO", the following Medicare IM given date fields will be blank)  Per UR Regulation:  Reviewed for med. necessity/level of care/duration of stay    Comments:  12/17/2015Mindi Slicker RN,BSN,MHA [phone]

## 2014-09-15 NOTE — Progress Notes (Signed)
INITIAL NUTRITION ASSESSMENT  DOCUMENTATION CODES Per approved criteria  -Severe malnutrition in the context of chronic illness  Pt meets criteria for severe MALNUTRITION in the context of chronic illness as evidenced by PO intake < 50% for one month, 6% body weight loss in one month.   INTERVENTION: -Recommend Boost Plus po BID, each supplement provides 360 kcal and 14 grams of protein -Recommend MagicCup once daily, each supplement provides 290 kcal, 9 gram protein -Recommend use of Biotene for oral rinses/improvement in taste and dry mouth -RD to continue to monitor  NUTRITION DIAGNOSIS: Inadequate oral intake related to taste changes/difficulty swallowing as evidenced by PO intake < 75%,~10 lb wt loss in one month.   Goal: Pt to meet >/= 90% of their estimated nutrition needs    Monitor:  Total protein/energy intake, labs, weight, supplement tolerance, swallow profile  Reason for Assessment: MST  68 y.o. male  Admitting Dx: Generalized weakness  ASSESSMENT: [name] [name] is a 68 y.o. 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 Patient reports that he is receiving chemotherapy for esophageal cancer. Last dose was on 09/12/14. He is s/p of status of radiotherapy. In the past several days, he has progressively worsening weakness. He has very poor appetite. Yesterday, he slipped from chair to the floor, and could not stand up by himself.    -Pt reported poor PO intake that started with the beginning of chemotherapy treatments -Diet recall indicates pt tolerating largely liquid diet; meals are usually soups with Ensure Complete. Had been recommended to drink TID; however pt only able to tolerate BID.  -Encouraged pt to snacks on soft high kcal foods, such as pudding and ice cream -Currently on full liquid diet. Pt reported tolerating texture, noted > 50% completion. Pt willing to  trial MagicCup with dinner meal -Pt noted that taste changes decreases appetite, consider oral rinses to assist with improving taste -Pt has Ensure BID ordered, declining as he noted it causes diarrhea. Will modify to Boost and assess tolerance/acceptance -Denied weight loss; however med records indicate pt with 10 lb wt loss in one month (6% body weight, severe for time frame) -Was being followed by outpatient oncology RD. Last seen on 04/2014. Pt was reported to have had good appetite and stable weight -Admitted with dehydration; receiving fluids -K low; being replaced   Height: Ht Readings from Last 1 Encounters:  09/15/14 5\' 7"  (1.702 m)    Weight: Wt Readings from Last 1 Encounters:  09/15/14 188 lb 3.2 oz (85.367 kg)    Ideal Body Weight: 148 lb  % Ideal Body Weight: 127%  Wt Readings from Last 10 Encounters:  09/15/14 188 lb 3.2 oz (85.367 kg)  09/09/14 200 lb 8 oz (90.946 kg)  09/02/14 195 lb 11.2 oz (88.769 kg)  08/18/14 208 lb (94.348 kg)  08/15/14 202 lb 6.4 oz (91.808 kg)  07/22/14 202 lb 12.8 oz (91.989 kg)  05/30/14 200 lb 9.6 oz (90.992 kg)  05/18/14 203 lb 8 oz (92.307 kg)  05/17/14 202 lb (91.627 kg)  05/03/14 200 lb 14.4 oz (91.128 kg)    Usual Body Weight: 200  % Usual Body Weight: 94%  BMI:  Body mass index is 29.47 kg/(m^2).  Estimated Nutritional Needs: Kcal: 1950-2150 Protein: 100-110 gram Fluid: >/=2100 ml daily  Skin: stage 2 pressure ulcer on coccyx  Diet Order: Diet full liquid  EDUCATION NEEDS: -No education needs identified at this time  Intake/Output Summary (Last 24 hours) at 09/15/14 1336 Last data filed at 09/15/14 0902  Gross per 24 hour  Intake 753.33 ml  Output    451 ml  Net 302.33 ml    Last BM: 12/17   Labs:   Recent Labs Lab 09/09/14 1020 09/14/14 1932 09/15/14 0430  NA 137 128* 131*  K 3.9 3.2* 3.4*  CL  --  90* 95*  CO2 27 24 24   BUN 32.1* 20 20  CREATININE 1.0 1.22 1.10  CALCIUM 8.3* 8.4 7.6*   MG 1.9  --   --   GLUCOSE 113 117* 98    CBG (last 3)   Recent Labs  09/15/14 0744  GLUCAP 107*    Scheduled Meds: . dutasteride  0.5 mg Oral QPM  . feeding supplement (ENSURE COMPLETE)  237 mL Oral BID BM  . magnesium oxide  400 mg Oral BID  . pantoprazole  40 mg Oral Daily  . Rivaroxaban  15 mg Oral BID WC  . tiotropium  18 mcg Inhalation Daily  . vitamin E  1,000 Units Oral Daily    Continuous Infusions: . sodium chloride 125 mL/hr at 09/15/14 0056    Past Medical History  Diagnosis Date  . GERD (gastroesophageal reflux disease)   . Hyperlipemia   . Barrett's esophagus 12/2013  . History of radiation therapy 02/23/14- 04/04/14    esophagus 5040 cGy 28 sessions  . 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 Hypertension     Past Surgical History  Procedure Laterality Date  . Eus N/A 02/03/2014    Procedure: UPPER ENDOSCOPIC ULTRASOUND (EUS) LINEAR;  Surgeon: Milus Banister, MD;  Location: [facility];  Service: Endoscopy;  Laterality: N/A;    Atlee Abide MS RD LDN Clinical Dietitian [phone]

## 2014-09-16 ENCOUNTER — Telehealth: Payer: Self-pay | Admitting: *Deleted

## 2014-09-16 ENCOUNTER — Other Ambulatory Visit: Payer: Medicare Other

## 2014-09-16 ENCOUNTER — Encounter: Payer: Self-pay | Admitting: *Deleted

## 2014-09-16 DIAGNOSIS — R5383 Other fatigue: Secondary | ICD-10-CM

## 2014-09-16 DIAGNOSIS — E663 Overweight: Secondary | ICD-10-CM | POA: Diagnosis present

## 2014-09-16 DIAGNOSIS — D61810 Antineoplastic chemotherapy induced pancytopenia: Secondary | ICD-10-CM

## 2014-09-16 DIAGNOSIS — E876 Hypokalemia: Secondary | ICD-10-CM

## 2014-09-16 DIAGNOSIS — I5031 Acute diastolic (congestive) heart failure: Principal | ICD-10-CM

## 2014-09-16 DIAGNOSIS — I4891 Unspecified atrial fibrillation: Secondary | ICD-10-CM | POA: Diagnosis present

## 2014-09-16 DIAGNOSIS — I369 Nonrheumatic tricuspid valve disorder, unspecified: Secondary | ICD-10-CM

## 2014-09-16 DIAGNOSIS — D6481 Anemia due to antineoplastic chemotherapy: Secondary | ICD-10-CM

## 2014-09-16 DIAGNOSIS — E43 Unspecified severe protein-calorie malnutrition: Secondary | ICD-10-CM

## 2014-09-16 DIAGNOSIS — C155 Malignant neoplasm of lower third of esophagus: Secondary | ICD-10-CM

## 2014-09-16 LAB — CBC
HEMATOCRIT: 21.9 % — AB (ref 39.0–52.0)
Hemoglobin: 7.5 g/dL — ABNORMAL LOW (ref 13.0–17.0)
MCH: 31.5 pg (ref 26.0–34.0)
MCHC: 34.2 g/dL (ref 30.0–36.0)
MCV: 92 fL (ref 78.0–100.0)
Platelets: 38 10*3/uL — ABNORMAL LOW (ref 150–400)
RBC: 2.38 MIL/uL — ABNORMAL LOW (ref 4.22–5.81)
RDW: 16.7 % — AB (ref 11.5–15.5)
WBC: 3.1 10*3/uL — ABNORMAL LOW (ref 4.0–10.5)

## 2014-09-16 LAB — GLUCOSE, CAPILLARY
GLUCOSE-CAPILLARY: 92 mg/dL (ref 70–99)
GLUCOSE-CAPILLARY: 96 mg/dL (ref 70–99)

## 2014-09-16 LAB — BASIC METABOLIC PANEL
ANION GAP: 13 (ref 5–15)
BUN: 17 mg/dL (ref 6–23)
CO2: 22 mEq/L (ref 19–32)
CREATININE: 1.02 mg/dL (ref 0.50–1.35)
Calcium: 7.3 mg/dL — ABNORMAL LOW (ref 8.4–10.5)
Chloride: 101 mEq/L (ref 96–112)
GFR calc non Af Amer: 73 mL/min — ABNORMAL LOW (ref >90)
GFR, EST AFRICAN AMERICAN: 85 mL/min — AB (ref >90)
Glucose, Bld: 95 mg/dL (ref 70–99)
Potassium: 3 mEq/L — ABNORMAL LOW (ref 3.7–5.3)
Sodium: 136 mEq/L — ABNORMAL LOW (ref 137–147)

## 2014-09-16 MED ORDER — LIVING BETTER WITH HEART FAILURE BOOK: Freq: Once | Status: DC

## 2014-09-16 MED ORDER — POTASSIUM CHLORIDE CRYS ER 20 MEQ PO TBCR
40.0000 meq | EXTENDED_RELEASE_TABLET | Freq: Two times a day (BID) | ORAL | Status: AC
  Administered 2014-09-16 – 2014-09-17 (×2): 40 meq via ORAL
  Filled 2014-09-16 (×2): qty 2

## 2014-09-16 MED ORDER — MAGIC MOUTHWASH W/LIDOCAINE
5.0000 mL | Freq: Four times a day (QID) | ORAL | Status: DC | PRN
  Administered 2014-09-16 – 2014-09-19 (×8): 5 mL via ORAL
  Filled 2014-09-16 (×15): qty 5

## 2014-09-16 MED ORDER — METOPROLOL TARTRATE 25 MG PO TABS
12.5000 mg | ORAL_TABLET | Freq: Two times a day (BID) | ORAL | Status: DC
  Administered 2014-09-16 – 2014-09-18 (×3): 12.5 mg via ORAL
  Filled 2014-09-16 (×5): qty 1

## 2014-09-16 NOTE — Progress Notes (Signed)
Patient has a bed at [facility] when ready. CSW confirmed that they could take over the weekend, if so - please contact weekend social worker, Rollene Fare @ ph#: [phone].   Clinical Social Work Department CLINICAL SOCIAL WORK PLACEMENT NOTE 09/16/2014  Patient:  [name], [name]  Account Number:  [id number] Admit date:  09/14/2014  Clinical Social Worker:  Renold Genta  Date/time:  09/16/2014 03:42 PM  Clinical Social Work is seeking post-discharge placement for this patient at the following level of care:   SKILLED NURSING   (*CSW will update this form in Epic as items are completed)   09/16/2014  Patient/family provided with [facility] Department of Clinical Social Work's list of facilities offering this level of care within the geographic area requested by the patient (or if unable, by the patient's family).  09/16/2014  Patient/family informed of their freedom to choose among providers that offer the needed level of care, that participate in Medicare, Medicaid or managed care program needed by the patient, have an available bed and are willing to accept the patient.  09/16/2014  Patient/family informed of [facility]' ownership interest in [facility], as well as of the fact that they are under no obligation to receive care at this facility.  PASARR submitted to EDS on 09/16/2014 PASARR number received on 09/16/2014  FL2 transmitted to all facilities in geographic area requested by pt/family on  09/16/2014 FL2 transmitted to all facilities within larger geographic area on   Patient informed that his/her managed care company has contracts with or will negotiate with  certain facilities, including the following:     Patient/family informed of bed offers received:  09/16/2014 Patient chooses bed at [facility] Physician recommends and patient chooses bed at    Patient to be transferred to [facility] on   Patient to be  transferred to facility by  Patient and family notified of transfer on  Name of family member notified:    The following physician request were entered in Epic:   Additional Comments:   Raynaldo Opitz, [facility] Social Worker cell #: [phone]

## 2014-09-16 NOTE — Progress Notes (Signed)
DIAGNOSIS: Metastatic esophageal adenocarcinoma initially diagnosed as Locally advanced, stage IIIB (T3, N2, M0) distal esophageal adenocarcinoma diagnosed in May of 2015  PRIOR THERAPY: Concurrent chemoradiation with weekly carboplatin for AUC of 2 and paclitaxel 45 mg/M2 for 5 weeks. Last dose was given on 03/29/1999  CURRENT THERAPY: Systemic chemotherapy with cisplatin 75 mg/M2, docetaxel 75 mg/M2 on day 1 and 5-FU 750 mg/M2 continuous infusion for 5 days. For cycle one on 05/23/2014. Status post 6 cycles.  Subjective: The patient is seen and examined today. His granddaughter was at the bedside. He is a very pleasant 68 years old African-American male who was diagnosed with metastatic esophageal adenocarcinoma in May 2015 and has been undergoing treatment with concurrent chemoradiation and recently with systemic chemotherapy with cisplatin, docetaxel and continuous infusion 5-FU status post 6 cycles. He was also recently diagnosed with deep venous thrombosis of the right lower extremity and was started on treatment with oral Xarelto. The patient was admitted yesterday with generalized weakness and fatigue as well as lack of appetite and more weakness in the right lower extremity. He is feeling a little bit better today. He denied having any fever or chills, no nausea or vomiting. He denied having any significant chest pain, shortness breath, cough or hemoptysis.  Objective: Vital signs in last 24 hours: Temp:  [98.3 F (36.8 C)-99.7 F (37.6 C)] 99.7 F (37.6 C) (12/18 2146) Pulse Rate:  [93-99] 93 (12/18 2146) Resp:  [18] 18 (12/18 2146) BP: (96-125)/(47-64) 96/56 mmHg (12/18 2146) SpO2:  [96 %-100 %] 98 % (12/18 2146)  Intake/Output from previous day: 12/17 0701 - 12/18 0700 In: 3480 [P.O.:480; I.V.:3000] Out: 2226 [Urine:2225; Stool:1] Intake/Output this shift:    General appearance: alert, cooperative, fatigued and no distress Resp: clear to auscultation bilaterally Cardio:  regular rate and rhythm, S1, S2 normal, no murmur, click, rub or gallop GI: soft, non-tender; bowel sounds normal; no masses,  no organomegaly Extremities: edema 2+ right lower extremity  Lab Results:   Recent Labs  09/15/14 0430 09/16/14 0508  WBC 1.8* 3.1*  HGB 7.7* 7.5*  HCT 23.3* 21.9*  PLT 48* 38*   BMET  Recent Labs  09/15/14 0430 09/16/14 0508  NA 131* 136*  K 3.4* 3.0*  CL 95* 101  CO2 24 22  GLUCOSE 98 95  BUN 20 17  CREATININE 1.10 1.02  CALCIUM 7.6* 7.3*    Studies/Results: No results found.  Medications: I have reviewed the patient's current medications.   Assessment/Plan: 1) metastatic esophageal adenocarcinoma recently completed cycle #6 of systemic chemotherapy with cisplatin, docetaxel and continuous infusion 5-FU. He would have repeat CT scan of the chest, abdomen and pelvis and less than 3 weeks for restaging of his disease. 2) generalized weakness and fatigue: Most likely secondary to his disease as well as the treatment. Continue current supportive care. He may benefit from discharge to a skilled nursing facility for rehabilitation. 3) chemotherapy-induced anemia: Consider transfusion of 2 units of PRBCs. 4) pancytopenia: Secondary to recent chemotherapy. We will monitor the white blood count and platelets count closely during this admission. 5) hypokalemia: K placement by the hospitalist. Thank you for taking good care of Mr. Ramcharan, I will continue to follow up the patient with you and assist in his management and as needed basis.    LOS: 2 days    Devereaux Grayson K. 09/16/2014

## 2014-09-16 NOTE — Progress Notes (Addendum)
PROGRESS NOTE  [name] [name] TRR:[id number] DOB: 1946-07-30 DOA: 09/14/2014 PCP: [name], MD  HPI/Recap of past 12 hours: 68 year old with past medical history of esophageal carcinoma currently on chemotherapy, DVT on xarelto and emphysema admitted earlier this morning for weakness and unable to stand. Patient reports some episodes of loose stools, but he says he has had such poor by mouth intake that point in that he can take is boost shakes. Lab work notable for mildly elevated lactic acid level which soon resolved and also pancytopenia. Patient was admitted for failure to thrive, following admission, BNP (ordered because of fluid seen on chest x-ray )came back elevated at 2151, with patient having no history of CHF.  Patient started on IV Lasix and has been diuresing well. Echocardiogram done which could not confirm diastolic dysfunction because of atrial fibrillation (also new diagnosis ), but did note preserved ejection fraction.  Patient himself states that he is feeling much better today, more strong.  Seen by PT and OT who recommending short-term skilled nursing.  Assessment/Plan: Principal Problem:   Generalized weakness: Likely overall deconditioning and weakness. Have added Megace. Underlying heart failure may be a contributing factor. See below. Initial diarrhea is likely more just from liquid diet, C. difficile negative.    . Malignant neoplasm of lower third of esophagus: Currently getting chemotherapy   . elevated BNP in the setting of ILD (interstitial lung disease)/centrilobular emphysema: This part is stable, although he may have cor pulmonale causing congestive heart failure.   . Essential hypertension, benign: Stable   . DVT (deep venous thrombosis): On Xarelto  . Hypokalemia: Replacing accordingly   . Protein-calorie malnutrition, severe: Patient meets criteria for severe malnutrition in the context of chronic illness as evidenced by by mouth intake of less  than 50% for 1 month, 6% weight loss in one month . Appreciate nutrition consult. Patient started on Ensure Plus Magic cup plus Biotene, advanced diet to heart healthy  . Other pancytopenia: Secondary to chemotherapy. Not neutropenic. Monitor hemoglobin and transfuse if falls below 7. Slowly continues to improve   Atrial fibrillation:  mentioned on echocardiogram report. Initial EKG on admission shows some sinus arrhythmia, but? P waves. On exam, heart rate has occasional ectopic beats but appears regular rate and rhythm. Recheck EKG which did confirm atrial fibrillation. Heart rate staying in the 90s. Have started low-dose beta blocker. Patient already on anticoagulation for history of DVT   Acute diastolic heart failure: suspect he likely does have some mild diastolic heart failure. Recheck BNP, daily weights, change IV Lasix to by mouth likely tomorrow once better diuresed.  Given persistent heart rate in the 90s, has started low-dose beta blocker. Patient already on ARB combo drug   Code Status: Full code  Family Communication: Spoke with daughter at the bedside  Dispositionion Plan: confirm atrial fibrillation. Fully diuresed, skilled nursing short-term on Monday    Consultants:  Oncology notified about patient's admission  Procedures: 2-D echocardiogram done 12/18: Preserved ejection fraction, unable to evaluate diastolic dysfunction due to atrial fibrillation  Antibiotics:  None   Objective: BP 100/64 mmHg  Pulse 99  Temp(Src) 98.9 F (37.2 C) (Oral)  Resp 18  Ht 5\' 7"  (1.702 m)  Wt 85.367 kg (188 lb 3.2 oz)  BMI 29.47 kg/m2  SpO2 100%  Intake/Output Summary (Last 24 hours) at 09/16/14 1537 Last data filed at 09/16/14 1300  Gross per 24 hour  Intake 4438.33 ml  Output   2625 ml  Net 1813.33 ml  Filed Weights   09/15/14 0100  Weight: 85.367 kg (188 lb 3.2 oz)    Exam:  Gen.: Alert and oriented 3, fatigued CV: Regular rate and rhythm, S1-S2, occasional  ectopic beat, 2/6 systolic ejection murmur Lungs: Decreased breath sounds throughout, prolonged expiratory phase Abd: Soft, nontender, nondistended, hypoactive bowel sounds Ext: No clubbing or cyanosis or edema     Data Reviewed: Basic Metabolic Panel:  Recent Labs Lab 09/14/14 1932 09/15/14 0430 09/16/14 0508  NA 128* 131* 136*  K 3.2* 3.4* 3.0*  CL 90* 95* 101  CO2 24 24 22   GLUCOSE 117* 98 95  BUN 20 20 17   CREATININE 1.22 1.10 1.02  CALCIUM 8.4 7.6* 7.3*   Liver Function Tests:  Recent Labs Lab 09/14/14 1932  AST 33  ALT 25  ALKPHOS 54  BILITOT 1.4*  PROT 5.7*  ALBUMIN 2.4*   No results for input(s): LIPASE, AMYLASE in the last 168 hours. No results for input(s): AMMONIA in the last 168 hours. CBC:  Recent Labs Lab 09/14/14 1932 09/15/14 0430 09/16/14 0508  WBC 1.2* 1.8* 3.1*  NEUTROABS 0.2*  --   --   HGB 8.5* 7.7* 7.5*  HCT 25.3* 23.3* 21.9*  MCV 92.3 92.5 92.0  PLT 56* 48* 38*   Cardiac Enzymes:    Recent Labs Lab 09/14/14 1932  TROPONINI <0.30   BNP (last 3 results)  Recent Labs  09/15/14 1320  PROBNP 2151.0*   CBG:  Recent Labs Lab 09/15/14 0744 09/16/14 0746 09/16/14 1204  GLUCAP 107* 92 96    Recent Results (from the past 240 hour(s))  Clostridium Difficile by PCR     Status: None   Collection Time: 09/15/14  8:59 AM  Result Value Ref Range Status   C difficile by pcr NEGATIVE NEGATIVE Final    Comment: Performed at [facility]     Studies: No results found.  Scheduled Meds: . dutasteride  0.5 mg Oral QPM  . lactose free nutrition  237 mL Oral BID BM  . Living Better with Heart Failure Book   Does not apply Once  . magnesium oxide  400 mg Oral BID  . megestrol  400 mg Oral Daily  . pantoprazole  40 mg Oral Daily  . potassium chloride  40 mEq Oral BID  . Rivaroxaban  15 mg Oral BID WC  . tiotropium  18 mcg Inhalation Daily  . vitamin E  1,000 Units Oral Daily    Continuous Infusions: . sodium  chloride 0 mL/hr at 09/16/14 1116     Time spent: 35 minutes  [facility] Pager [phone]. If 7PM-7AM, please contact night-coverage at [email or web address], password [facility] 09/16/2014, 3:37 PM  LOS: 2 days

## 2014-09-16 NOTE — Progress Notes (Signed)
  Echocardiogram 2D Echocardiogram has been performed.  Kent Riendeau FRANCES 09/16/2014, 11:04 AM

## 2014-09-16 NOTE — Progress Notes (Signed)
Clinical Social Work Department BRIEF PSYCHOSOCIAL ASSESSMENT 09/16/2014  Patient:  [name] [name],[name] [name]     Account Number:  [id number]     Admit date:  09/14/2014  Clinical Social Worker:  Renold Genta  Date/Time:  09/16/2014 03:39 PM  Referred by:  Physician  Date Referred:  09/16/2014 Referred for  SNF Placement   Other Referral:   Interview type:  Patient Other interview type:   and grandaughter, [name] via phone    PSYCHOSOCIAL DATA Living Status:  ALONE Admitted from facility:   Level of care:   Primary support name:  [name] (grandaughter) c#: [phone] Primary support relationship to patient:  FAMILY Degree of support available:   good    CURRENT CONCERNS Current Concerns  Post-Acute Placement   Other Concerns:    SOCIAL WORK ASSESSMENT / PLAN CSW reviewed PT evaluation recommending SNF at discharge.   Assessment/plan status:  Information/Referral to [employer] Other assessment/ plan:   Information/referral to community resources:   CSW completed FL2 and faxed information out to [facility] - provided bed offers to patient & grandaughter.    PATIENT'S/FAMILY'S RESPONSE TO PLAN OF CARE: Patient informed CSW that he lives alone and is agreeable with plan for SNF - accepted bed offer at [facility]. CSW confirmed with Ellen @ [place] that they would be able to take patient when ready - if ready over the weekend, please contact weekend social worker, Rollene Fare @ ph#: [phone].       Raynaldo Opitz, [facility] Clinical Social Worker cell #: [phone]

## 2014-09-16 NOTE — Telephone Encounter (Signed)
Per Dr Vista Mink, pt is requesting a letter to have his mailbox moved closer to his home.  Letter written and left at the front desk.

## 2014-09-17 DIAGNOSIS — D61818 Other pancytopenia: Secondary | ICD-10-CM

## 2014-09-17 DIAGNOSIS — I4891 Unspecified atrial fibrillation: Secondary | ICD-10-CM

## 2014-09-17 DIAGNOSIS — J432 Centrilobular emphysema: Secondary | ICD-10-CM

## 2014-09-17 LAB — CBC
HCT: 23.6 % — ABNORMAL LOW (ref 39.0–52.0)
HEMOGLOBIN: 7.8 g/dL — AB (ref 13.0–17.0)
MCH: 30.5 pg (ref 26.0–34.0)
MCHC: 33.1 g/dL (ref 30.0–36.0)
MCV: 92.2 fL (ref 78.0–100.0)
PLATELETS: 46 10*3/uL — AB (ref 150–400)
RBC: 2.56 MIL/uL — ABNORMAL LOW (ref 4.22–5.81)
RDW: 16.7 % — ABNORMAL HIGH (ref 11.5–15.5)
WBC: 7.1 10*3/uL (ref 4.0–10.5)

## 2014-09-17 LAB — ABO/RH: ABO/RH(D): O POS

## 2014-09-17 LAB — PREPARE RBC (CROSSMATCH)

## 2014-09-17 LAB — BASIC METABOLIC PANEL
Anion gap: 11 (ref 5–15)
BUN: 16 mg/dL (ref 6–23)
CO2: 23 meq/L (ref 19–32)
Calcium: 7.6 mg/dL — ABNORMAL LOW (ref 8.4–10.5)
Chloride: 104 mEq/L (ref 96–112)
Creatinine, Ser: 1.15 mg/dL (ref 0.50–1.35)
GFR calc Af Amer: 74 mL/min — ABNORMAL LOW (ref >90)
GFR calc non Af Amer: 64 mL/min — ABNORMAL LOW (ref >90)
Glucose, Bld: 95 mg/dL (ref 70–99)
Potassium: 3.2 mEq/L — ABNORMAL LOW (ref 3.7–5.3)
Sodium: 138 mEq/L (ref 137–147)

## 2014-09-17 LAB — PRO B NATRIURETIC PEPTIDE: PRO B NATRI PEPTIDE: 642.5 pg/mL — AB (ref 0–125)

## 2014-09-17 LAB — GLUCOSE, CAPILLARY: GLUCOSE-CAPILLARY: 90 mg/dL (ref 70–99)

## 2014-09-17 MED ORDER — HYDROCODONE-ACETAMINOPHEN 5-325 MG PO TABS
1.0000 | ORAL_TABLET | ORAL | Status: AC | PRN
  Administered 2014-09-17 – 2014-09-18 (×3): 1 via ORAL
  Filled 2014-09-17 (×3): qty 1

## 2014-09-17 MED ORDER — POTASSIUM CHLORIDE CRYS ER 20 MEQ PO TBCR
40.0000 meq | EXTENDED_RELEASE_TABLET | Freq: Two times a day (BID) | ORAL | Status: AC
  Administered 2014-09-17 – 2014-09-18 (×2): 40 meq via ORAL
  Filled 2014-09-17 (×3): qty 2

## 2014-09-17 MED ORDER — FUROSEMIDE 10 MG/ML IJ SOLN
20.0000 mg | Freq: Once | INTRAMUSCULAR | Status: AC
  Administered 2014-09-17: 20 mg via INTRAVENOUS
  Filled 2014-09-17: qty 2

## 2014-09-17 MED ORDER — SODIUM CHLORIDE 0.9 % IV SOLN
Freq: Once | INTRAVENOUS | Status: AC
  Administered 2014-09-17: 08:00:00 via INTRAVENOUS

## 2014-09-17 MED ORDER — SODIUM CHLORIDE 0.9 % IJ SOLN: 10.0000 mL | Freq: Two times a day (BID) | INTRAMUSCULAR | Status: DC

## 2014-09-17 MED ORDER — SODIUM CHLORIDE 0.9 % IJ SOLN
10.0000 mL | INTRAMUSCULAR | Status: DC | PRN
  Administered 2014-09-18: 20 mL
  Administered 2014-09-18 – 2014-09-19 (×2): 10 mL
  Filled 2014-09-17 (×3): qty 40

## 2014-09-17 NOTE — Progress Notes (Signed)
1 u PRBC with unit # [id number] transfused, patient tolerated transfusion well.No adverse reaction noted.

## 2014-09-17 NOTE — Progress Notes (Signed)
[facility]                                                                              Patient Demographics  [name] [name], is a 68 y.o. male, DOB - November 18, 1945, NAT:[id number]  Admit date - 09/14/2014   Admitting Physician Ivor Costa, MD  Outpatient Primary MD for the patient is [name], MD  LOS - 3   Chief Complaint  Patient presents with  . Weakness  . Lung Cancer      HPI on 09/15/2014 [name] [name] is a 68 y.o. 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 Patient reports that he is receiving chemotherapy for esophageal cancer. Last dose was on 09/12/14. He is s/p of status of radiotherapy. In the past several days, he has progressively worsening weakness. He has very poor appetite. Yesterday, he slipped from chair to the floor, and could not stand up by himself. He does not have nausea, vomiting, diarrhea, abdominal pain. Patient does not have fever, chills, cough, chest pain, shortness of breath. Work up in the ED demonstrates activity UTI, lactate 2.63, negative troponin. Potassium 3.2. Chest x-ray showed right minor fissure fluid accumulation, no infiltration.    Assessment & Plan   Generalized Weakness/Deconditioning  -May be complicated by underlying heart failure vs diarrhea, Cdiff PCR negative -Continue Megace -PT and OT consulted and recommended SNF  Acute diastolic heart failure -BNP on admission 2151, repeat 642 -Echocardiogram showed EF of 60-65% however unable to evaluate LV diastolic function due to atrial fibrillation -Continue to monitor daily weights, intake and output -Continue metoprolol, Lasix  Hypokalemia -Continue to replace and monitor BMP  Esophageal Cancer with pancytopenia -Oncology consulted and appreciated -Recommended transfusion of 2uPRBCs -Hemoglobin dropped to 7.8 (baseline appears to be approximately 9) -Will give patient  1 unit packed red blood cells, followed by Lasix 20 mg  Atrial fibrillation -EKG on admission showed sinus arrhythmia -On exam patient does have occasional ectopic beats however appears to be regular rate and rhythm -Repeat EKG did show atrial fibrillation -Currently rate controlled, patient is on Xarelto for DVT  DVT -Continue Xarelto  Hypertension -Appears to be stable  GERD -Continue PPI  Interstitial lung disease/Centrilobular Emphysema/COPD -Continue Spiriva -Possibly cor pulmonale causing CHF  Severe protein calorie malnutrition -Nutrition consultation appreciated -Continue Megace, feeding supplementation, cardiac healthy diet  Code Status: Full  Family Communication: None at bedside  Disposition Plan: Admitted, will likely discharge to SNF on 09/19/2014  Time Spent in minutes   30 minutes  Procedures  Echocardiogram showed EF of 60-65% however unable to evaluate LV diastolic function due to atrial fibrillation  Consults   Oncology  DVT Prophylaxis  Xarelto  Lab Results  Component Value Date   PLT 46* 09/17/2014    Medications  Scheduled Meds: . dutasteride  0.5 mg Oral QPM  . furosemide  20 mg Intravenous Once  . lactose free nutrition  237 mL Oral BID BM  . Living Better with Heart Failure Book   Does not apply Once  . magnesium oxide  400 mg Oral BID  . megestrol  400 mg Oral Daily  .  metoprolol tartrate  12.5 mg Oral BID  . pantoprazole  40 mg Oral Daily  . Rivaroxaban  15 mg Oral BID WC  . tiotropium  18 mcg Inhalation Daily  . vitamin E  1,000 Units Oral Daily   Continuous Infusions: . sodium chloride 0 mL/hr at 09/16/14 1116   PRN Meds:.antiseptic oral rinse, HYDROcodone-acetaminophen, irbesartan **AND** [DISCONTINUED] hydrochlorothiazide, lidocaine-prilocaine, magic mouthwash w/lidocaine, magnesium hydroxide, prochlorperazine, traMADol  Antibiotics    Anti-infectives    None      Subjective:   [name] [name] seen and examined  today.  Patient has no complaints today. States he is feeling better. Patient denies any chest pain, shortness of breath, abdominal pain or diarrhea at this time.  Objective:   Filed Vitals:   09/16/14 1352 09/16/14 2146 09/17/14 0608 09/17/14 1048  BP: 100/64 96/56 131/60   Pulse: 99 93 87   Temp: 98.9 F (37.2 C) 99.7 F (37.6 C) 97.9 F (36.6 C)   TempSrc: Oral Oral Oral   Resp: 18 18 18    Height:      Weight:   85.231 kg (187 lb 14.4 oz)   SpO2: 100% 98% 98% 98%    Wt Readings from Last 3 Encounters:  09/17/14 85.231 kg (187 lb 14.4 oz)  09/09/14 90.946 kg (200 lb 8 oz)  09/02/14 88.769 kg (195 lb 11.2 oz)     Intake/Output Summary (Last 24 hours) at 09/17/14 1111 Last data filed at 09/17/14 0700  Gross per 24 hour  Intake 1078.33 ml  Output   1400 ml  Net -321.67 ml    Exam  General: Well developed, well nourished, NAD, appears stated age  HEENT: NCAT, mucous membranes moist.   Cardiovascular: S1 S2 auscultated, 2/6 SEM, occasional ectopic beat, Regular rate and rhythm.  Respiratory: Diminished however clear breath sounds noted  Abdomen: Soft, nontender, nondistended, + bowel sounds  Extremities: warm dry without cyanosis clubbing or edema  Psych: Appropriate mood and affect  Data Review   Micro Results Recent Results (from the past 240 hour(s))  Clostridium Difficile by PCR     Status: None   Collection Time: 09/15/14  8:59 AM  Result Value Ref Range Status   C difficile by pcr NEGATIVE NEGATIVE Final    Comment: Performed at [facility]    Radiology Reports Dg Chest 2 View  09/14/2014   CLINICAL DATA:  Recent falls due to weakness. History of lung cancer.  EXAM: CHEST  2 VIEW  COMPARISON:  Chest CT 06/1914  FINDINGS: Port-A-Cath tip in the lower SVC region. Chronic changes at the lung bases compatible with bronchiectasis. New densities in the right mid lung are suggestive for fluid or densities in the right minor fissure. Heart size is  normal. Negative for pneumothorax. Bony structures appear to be intact.  IMPRESSION: There is new fluid or densities in the right minor fissure region.  Chronic changes and bronchiectasis at the lung bases.   Electronically Signed   By: Markus Daft M.D.   On: 09/14/2014 20:18    CBC  Recent Labs Lab 09/14/14 1932 09/15/14 0430 09/16/14 0508 09/17/14 0455  WBC 1.2* 1.8* 3.1* 7.1  HGB 8.5* 7.7* 7.5* 7.8*  HCT 25.3* 23.3* 21.9* 23.6*  PLT 56* 48* 38* 46*  MCV 92.3 92.5 92.0 92.2  MCH 31.0 30.6 31.5 30.5  MCHC 33.6 33.0 34.2 33.1  RDW 16.5* 16.6* 16.7* 16.7*  LYMPHSABS 0.4*  --   --   --   MONOABS 0.6  --   --   --  EOSABS 0.0  --   --   --   BASOSABS 0.0  --   --   --     Chemistries   Recent Labs Lab 09/14/14 1932 09/15/14 0430 09/16/14 0508 09/17/14 0455  NA 128* 131* 136* 138  K 3.2* 3.4* 3.0* 3.2*  CL 90* 95* 101 104  CO2 24 24 22 23   GLUCOSE 117* 98 95 95  BUN 20 20 17 16   CREATININE 1.22 1.10 1.02 1.15  CALCIUM 8.4 7.6* 7.3* 7.6*  AST 33  --   --   --   ALT 25  --   --   --   ALKPHOS 54  --   --   --   BILITOT 1.4*  --   --   --    ------------------------------------------------------------------------------------------------------------------ estimated creatinine clearance is 64.1 mL/min (by C-G formula based on Cr of 1.15). ------------------------------------------------------------------------------------------------------------------ No results for input(s): HGBA1C in the last 72 hours. ------------------------------------------------------------------------------------------------------------------ No results for input(s): CHOL, HDL, LDLCALC, TRIG, CHOLHDL, LDLDIRECT in the last 72 hours. ------------------------------------------------------------------------------------------------------------------ No results for input(s): TSH, T4TOTAL, T3FREE, THYROIDAB in the last 72 hours.  Invalid input(s):  FREET3 ------------------------------------------------------------------------------------------------------------------ No results for input(s): VITAMINB12, FOLATE, FERRITIN, TIBC, IRON, RETICCTPCT in the last 72 hours.  Coagulation profile No results for input(s): INR, PROTIME in the last 168 hours.  No results for input(s): DDIMER in the last 72 hours.  Cardiac Enzymes  Recent Labs Lab 09/14/14 1932  TROPONINI <0.30   ------------------------------------------------------------------------------------------------------------------ Invalid input(s): POCBNP    [name] [name] D.O. on 09/17/2014 at 11:11 AM  Between 7am to 7pm - Pager - [phone]  After 7pm go to [email or web address] - password [email or web address]  And look for the night coverage person covering for me after hours  [facility] Office  [phone]

## 2014-09-18 DIAGNOSIS — E663 Overweight: Secondary | ICD-10-CM

## 2014-09-18 LAB — GLUCOSE, CAPILLARY: Glucose-Capillary: 115 mg/dL — ABNORMAL HIGH (ref 70–99)

## 2014-09-18 LAB — CBC
HCT: 24.9 % — ABNORMAL LOW (ref 39.0–52.0)
HEMOGLOBIN: 8.6 g/dL — AB (ref 13.0–17.0)
MCH: 31.2 pg (ref 26.0–34.0)
MCHC: 34.5 g/dL (ref 30.0–36.0)
MCV: 90.2 fL (ref 78.0–100.0)
Platelets: 51 10*3/uL — ABNORMAL LOW (ref 150–400)
RBC: 2.76 MIL/uL — ABNORMAL LOW (ref 4.22–5.81)
RDW: 18 % — ABNORMAL HIGH (ref 11.5–15.5)
WBC: 11.1 10*3/uL — ABNORMAL HIGH (ref 4.0–10.5)

## 2014-09-18 LAB — BASIC METABOLIC PANEL
Anion gap: 11 (ref 5–15)
BUN: 14 mg/dL (ref 6–23)
CO2: 24 meq/L (ref 19–32)
Calcium: 7.5 mg/dL — ABNORMAL LOW (ref 8.4–10.5)
Chloride: 102 mEq/L (ref 96–112)
Creatinine, Ser: 1.19 mg/dL (ref 0.50–1.35)
GFR calc non Af Amer: 61 mL/min — ABNORMAL LOW (ref >90)
GFR, EST AFRICAN AMERICAN: 71 mL/min — AB (ref >90)
Glucose, Bld: 104 mg/dL — ABNORMAL HIGH (ref 70–99)
POTASSIUM: 3.7 meq/L (ref 3.7–5.3)
Sodium: 137 mEq/L (ref 137–147)

## 2014-09-18 MED ORDER — SALINE SPRAY 0.65 % NA SOLN
1.0000 | NASAL | Status: DC | PRN
  Administered 2014-09-19: 1 via NASAL
  Filled 2014-09-18: qty 44

## 2014-09-18 NOTE — Progress Notes (Signed)
[facility]                                                                              Patient Demographics  [name] [name], is a 68 y.o. male, DOB - [birth year], YQM:[id number]  Admit date - 09/14/2014   Admitting Physician Ivor Costa, MD  Outpatient Primary MD for the patient is [name], MD  LOS - 4   Chief Complaint  Patient presents with  . Weakness  . Lung Cancer      HPI on 09/15/2014 [name] [name] is a 68 y.o. 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 Patient reports that he is receiving chemotherapy for esophageal cancer. Last dose was on 09/12/14. He is s/p of status of radiotherapy. In the past several days, he has progressively worsening weakness. He has very poor appetite. Yesterday, he slipped from chair to the floor, and could not stand up by himself. He does not have nausea, vomiting, diarrhea, abdominal pain. Patient does not have fever, chills, cough, chest pain, shortness of breath. Work up in the ED demonstrates activity UTI, lactate 2.63, negative troponin. Potassium 3.2. Chest x-ray showed right minor fissure fluid accumulation, no infiltration.    Assessment & Plan   Generalized Weakness/Deconditioning  -May be complicated by underlying heart failure vs diarrhea, Cdiff PCR negative -Continue Megace -PT and OT consulted and recommended SNF  Acute diastolic heart failure -BNP on admission 2151, repeat 642 -Echocardiogram showed EF of 60-65% however unable to evaluate LV diastolic function due to atrial fibrillation -Continue to monitor daily weights, intake and output -Continue metoprolol, Lasix  Hypokalemia -Continue to replace and monitor BMP  Esophageal Cancer with pancytopenia -Oncology consulted and appreciated -Recommended transfusion of 2uPRBCs -Hemoglobin dropped to 8.6 (baseline appears to be approximately 9) -Patient was given  1u PRBCs on 12/19  Atrial fibrillation -EKG on admission showed sinus arrhythmia -On exam patient does have occasional ectopic beats however appears to be regular rate and rhythm -Repeat EKG did show atrial fibrillation -Currently rate controlled, patient is on Xarelto for DVT  DVT -Continue Xarelto  Hypertension -Appears to be stable  GERD -Continue PPI  Interstitial lung disease/Centrilobular Emphysema/COPD -Continue Spiriva -Possibly cor pulmonale causing CHF  Severe protein calorie malnutrition -Nutrition consultation appreciated -Continue Megace, feeding supplementation, cardiac healthy diet  Code Status: Full  Family Communication: None at bedside  Disposition Plan: Admitted, will likely discharge to SNF on 09/19/2014  Time Spent in minutes   25 minutes  Procedures  Echocardiogram showed EF of 60-65% however unable to evaluate LV diastolic function due to atrial fibrillation  Consults   Oncology  DVT Prophylaxis  Xarelto  Lab Results  Component Value Date   PLT 51* 09/18/2014    Medications  Scheduled Meds: . dutasteride  0.5 mg Oral QPM  . lactose free nutrition  237 mL Oral BID BM  . Living Better with Heart Failure Book   Does not apply Once  . magnesium oxide  400 mg Oral BID  . megestrol  400 mg Oral Daily  . metoprolol tartrate  12.5 mg Oral BID  . pantoprazole  40 mg Oral  Daily  . Rivaroxaban  15 mg Oral BID WC  . sodium chloride  10-40 mL Intracatheter Q12H  . tiotropium  18 mcg Inhalation Daily  . vitamin E  1,000 Units Oral Daily   Continuous Infusions: . sodium chloride 0 mL/hr at 09/16/14 1116   PRN Meds:.antiseptic oral rinse, HYDROcodone-acetaminophen, irbesartan **AND** [DISCONTINUED] hydrochlorothiazide, lidocaine-prilocaine, magic mouthwash w/lidocaine, magnesium hydroxide, prochlorperazine, sodium chloride, sodium chloride, traMADol  Antibiotics    Anti-infectives    None      Subjective:   [name] [name] seen and  examined today.  Patient complains of nasal dryness and congestion.  Patient denies dizziness, chest pain, shortness of breath, abdominal pain or diarrhea at this time.  Objective:   Filed Vitals:   09/17/14 1715 09/17/14 2150 09/18/14 0435 09/18/14 0802  BP: 98/60 92/54 109/62   Pulse: 81 102 104   Temp:  98.1 F (36.7 C) 98.6 F (37 C)   TempSrc: Oral Oral Oral   Resp: 18 20 18    Height:      Weight:   86.7 kg (191 lb 2.2 oz)   SpO2: 100% 98% 100% 100%    Wt Readings from Last 3 Encounters:  09/18/14 86.7 kg (191 lb 2.2 oz)  09/09/14 90.946 kg (200 lb 8 oz)  09/02/14 88.769 kg (195 lb 11.2 oz)     Intake/Output Summary (Last 24 hours) at 09/18/14 1700 Last data filed at 09/18/14 0600  Gross per 24 hour  Intake   1100 ml  Output   2550 ml  Net  -1450 ml    Exam  General: Well developed, well nourished, NAD, appears stated age  88: NCAT, mucous membranes moist.   Cardiovascular: S1 S2 auscultated, 2/6 SEM  Respiratory: Diminished however clear breath sounds noted  Abdomen: Soft, nontender, nondistended, + bowel sounds  Extremities: warm dry without cyanosis clubbing or edema   Psych: Appropriate mood and affect  Data Review   Micro Results Recent Results (from the past 240 hour(s))  Clostridium Difficile by PCR     Status: None   Collection Time: 09/15/14  8:59 AM  Result Value Ref Range Status   C difficile by pcr NEGATIVE NEGATIVE Final    Comment: Performed at [facility]    Radiology Reports Dg Chest 2 View  09/14/2014   CLINICAL DATA:  Recent falls due to weakness. History of lung cancer.  EXAM: CHEST  2 VIEW  COMPARISON:  Chest CT 06/1914  FINDINGS: Port-A-Cath tip in the lower SVC region. Chronic changes at the lung bases compatible with bronchiectasis. New densities in the right mid lung are suggestive for fluid or densities in the right minor fissure. Heart size is normal. Negative for pneumothorax. Bony structures appear to be  intact.  IMPRESSION: There is new fluid or densities in the right minor fissure region.  Chronic changes and bronchiectasis at the lung bases.   Electronically Signed   By: Markus Daft M.D.   On: 09/14/2014 20:18    CBC  Recent Labs Lab 09/14/14 1932 09/15/14 0430 09/16/14 0508 09/17/14 0455 09/18/14 0610  WBC 1.2* 1.8* 3.1* 7.1 11.1*  HGB 8.5* 7.7* 7.5* 7.8* 8.6*  HCT 25.3* 23.3* 21.9* 23.6* 24.9*  PLT 56* 48* 38* 46* 51*  MCV 92.3 92.5 92.0 92.2 90.2  MCH 31.0 30.6 31.5 30.5 31.2  MCHC 33.6 33.0 34.2 33.1 34.5  RDW 16.5* 16.6* 16.7* 16.7* 18.0*  LYMPHSABS 0.4*  --   --   --   --  MONOABS 0.6  --   --   --   --   EOSABS 0.0  --   --   --   --   BASOSABS 0.0  --   --   --   --     Chemistries   Recent Labs Lab 09/14/14 1932 09/15/14 0430 09/16/14 0508 09/17/14 0455 09/18/14 0610  NA 128* 131* 136* 138 137  K 3.2* 3.4* 3.0* 3.2* 3.7  CL 90* 95* 101 104 102  CO2 24 24 22 23 24   GLUCOSE 117* 98 95 95 104*  BUN 20 20 17 16 14   CREATININE 1.22 1.10 1.02 1.15 1.19  CALCIUM 8.4 7.6* 7.3* 7.6* 7.5*  AST 33  --   --   --   --   ALT 25  --   --   --   --   ALKPHOS 54  --   --   --   --   BILITOT 1.4*  --   --   --   --    ------------------------------------------------------------------------------------------------------------------ estimated creatinine clearance is 62.4 mL/min (by C-G formula based on Cr of 1.19). ------------------------------------------------------------------------------------------------------------------ No results for input(s): HGBA1C in the last 72 hours. ------------------------------------------------------------------------------------------------------------------ No results for input(s): CHOL, HDL, LDLCALC, TRIG, CHOLHDL, LDLDIRECT in the last 72 hours. ------------------------------------------------------------------------------------------------------------------ No results for input(s): TSH, T4TOTAL, T3FREE, THYROIDAB in the last 72  hours.  Invalid input(s): FREET3 ------------------------------------------------------------------------------------------------------------------ No results for input(s): VITAMINB12, FOLATE, FERRITIN, TIBC, IRON, RETICCTPCT in the last 72 hours.  Coagulation profile No results for input(s): INR, PROTIME in the last 168 hours.  No results for input(s): DDIMER in the last 72 hours.  Cardiac Enzymes  Recent Labs Lab 09/14/14 1932  TROPONINI <0.30   ------------------------------------------------------------------------------------------------------------------ Invalid input(s): POCBNP    Clayburn Weekly D.O. on 09/18/2014 at 9:53 AM  Between 7am to 7pm - Pager - [phone]  After 7pm go to [email or web address] - password [email or web address]  And look for the night coverage person covering for me after hours  [facility] Office  [phone]

## 2014-09-19 LAB — TYPE AND SCREEN
ABO/RH(D): O POS
Antibody Screen: NEGATIVE
Unit division: 0

## 2014-09-19 LAB — GLUCOSE, CAPILLARY: GLUCOSE-CAPILLARY: 104 mg/dL — AB (ref 70–99)

## 2014-09-19 MED ORDER — BOOST PLUS PO LIQD: 237.0000 mL | Freq: Two times a day (BID) | ORAL | Status: DC

## 2014-09-19 MED ORDER — MEGESTROL ACETATE 40 MG/ML PO SUSP: 400.0000 mg | Freq: Every day | ORAL | Status: DC

## 2014-09-19 MED ORDER — METOPROLOL TARTRATE 12.5 MG HALF TABLET: 12.5000 mg | ORAL_TABLET | Freq: Two times a day (BID) | ORAL | Status: DC

## 2014-09-19 MED ORDER — TRAMADOL HCL 50 MG PO TABS: 50.0000 mg | ORAL_TABLET | Freq: Four times a day (QID) | ORAL | Status: DC | PRN

## 2014-09-19 MED ORDER — HEPARIN SOD (PORK) LOCK FLUSH 100 UNIT/ML IV SOLN
500.0000 [IU] | INTRAVENOUS | Status: AC | PRN
  Administered 2014-09-19: 500 [IU]

## 2014-09-19 MED ORDER — BIOTENE DRY MOUTH MT LIQD: 15.0000 mL | OROMUCOSAL | Status: DC | PRN

## 2014-09-19 MED ORDER — MAGIC MOUTHWASH W/LIDOCAINE: 5.0000 mL | Freq: Four times a day (QID) | ORAL | Status: DC | PRN

## 2014-09-19 MED ORDER — RIVAROXABAN 20 MG PO TABS: 20.0000 mg | ORAL_TABLET | Freq: Every day | ORAL | Status: DC

## 2014-09-19 MED ORDER — LIDOCAINE-PRILOCAINE 2.5-2.5 % EX CREA: 1.0000 "application " | TOPICAL_CREAM | CUTANEOUS | Status: DC | PRN

## 2014-09-19 MED ORDER — RIVAROXABAN 15 MG PO TABS: 15.0000 mg | ORAL_TABLET | Freq: Two times a day (BID) | ORAL | Status: DC

## 2014-09-19 NOTE — Discharge Summary (Addendum)
Physician Discharge Summary  [name] [name] ELF:[id number] DOB: 1946/05/05 DOA: 09/14/2014  PCP: [name], MD  Admit date: 09/14/2014 Discharge date: 09/19/2014  Time spent: 45 minutes  Recommendations for Outpatient Follow-up:  Patient will be discharged to [facility]. He is to continue physicals with occupational health as recommended by the facility. Patient to continue his medications as prescribed. Patient will need to continue following up with his primary care physician as well as [name], oncologist upon discharge. Patient should also follow-up with the physician at the nursing facility. Patient should continue a heart healthy diet. Patient should have repeat CBC as well as BMP within 1 week of discharge.  Discharge Diagnoses:  Generalized weakness/deconditioning Acute diastolic heart failure Hypokalemia Esophageal cancer with pancytopenia Atrial fibrillation DVT  Hypertension GERD Interstitial lung disease/centrilobular emphysema/COPD Severe protein calorie malnutrition  Discharge Condition: Stable  Diet recommendation: heart healthy  Filed Weights   09/17/14 0608 09/18/14 0435 09/19/14 0418  Weight: 85.231 kg (187 lb 14.4 oz) 86.7 kg (191 lb 2.2 oz) 84.7 kg (186 lb 11.7 oz)    History of present illness:  on 09/15/2014 [name] [name] is a 68 y.o. 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 Patient reports that he is receiving chemotherapy for esophageal cancer. Last dose was on 09/12/14. He is s/p of status of radiotherapy. In the past several days, he has progressively worsening weakness. He has very poor appetite. Yesterday, he slipped from chair to the floor, and could not stand up by himself. He does not have nausea, vomiting, diarrhea, abdominal pain. Patient does not have fever, chills, cough, chest pain, shortness of breath. Work up in  the ED demonstrates activity UTI, lactate 2.63, negative troponin. Potassium 3.2. Chest x-ray showed right minor fissure fluid accumulation, no infiltration.  Hospital Course:  Generalized Weakness/Deconditioning  -May be complicated by underlying heart failure vs diarrhea, Cdiff PCR negative -Continue Megace -PT and OT consulted and recommended SNF  Acute diastolic heart failure -BNP on admission 2151, repeat 642 -Echocardiogram showed EF of 60-65% however unable to evaluate LV diastolic function due to atrial fibrillation -Continue to monitor daily weights, intake and output -Continue metoprolol, Lasix  Hypokalemia -Resolved, patient will need to have repeat BMP within 1 week of discharge  Esophageal Cancer with pancytopenia -Oncology consulted and appreciated -Recommended transfusion of 2uPRBCs -Hemoglobin dropped to 8.6 (baseline appears to be approximately 9) -Patient was given 1u PRBCs on 12/19 -Patient should have a repeat CBC within 1 week of discharge  Atrial fibrillation -EKG on admission showed sinus arrhythmia -On exam patient does have occasional ectopic beats however appears to be regular rate and rhythm -Repeat EKG did show atrial fibrillation -Currently rate controlled, patient is on Xarelto for DVT  DVT -Continue Xarelto  Hypertension -Appears to be stable  GERD -Continue PPI  Interstitial lung disease/Centrilobular Emphysema/COPD -Continue Spiriva -Possibly cor pulmonale causing CHF  Severe protein calorie malnutrition -Nutrition consultation appreciated -Continue Megace, feeding supplementation, cardiac healthy diet  Procedures: Echocardiogram showed EF of 60-65% however unable to evaluate LV diastolic function due to atrial fibrillation  Consultations: Oncology  Discharge Exam: Filed Vitals:   09/19/14 1000  BP: 94/59  Pulse: 101  Temp:   Resp:     General: Well developed, well nourished, NAD, appears stated age  HEENT: NCAT, mucous  membranes moist.  Cardiovascular: S1 S2 auscultated, Regular rate and rhythm. 2/6 SEM  Respiratory: Clear to auscultation bilaterally with equal  chest rise  Abdomen: Soft, nontender, nondistended, + bowel sounds  Extremities: warm dry without cyanosis clubbing or edema  Neuro: AAOx3, no focal deficits  Psych: Normal affect and demeanor   Discharge Instructions      Discharge Instructions    Discharge instructions    Complete by:  As directed   Patient will be discharged to [facility]. He is to continue physicals with occupational health as recommended by the facility. Patient to continue his medications as prescribed. Patient will need to continue following up with his primary care physician as well as [name], oncologist upon discharge. Patient should also follow-up with the physician at the nursing facility. Patient should continue a heart healthy diet. Patient should have repeat CBC as well as BMP within 1 week of discharge.            Medication List    STOP taking these medications        Rivaroxaban 15 & 20 MG Tbpk  Commonly known as:  XARELTO STARTER PACK  Replaced by:  rivaroxaban 20 MG Tabs tablet  You also have another medication with the same name that you need to continue taking as instructed.      TAKE these medications        antiseptic oral rinse Liqd  15 mLs by Mouth Rinse route as needed for dry mouth.     dexamethasone 4 MG tablet  Commonly known as:  DECADRON  2 tab po bid the day before, day of and day after chemo every 3 weeks.     dutasteride 0.5 MG capsule  Commonly known as:  AVODART  Take 0.5 mg by mouth every evening.     Garlic 7169 MG Caps  Take 1 capsule by mouth daily.     lactose free nutrition Liqd  Take 237 mLs by mouth 2 (two) times daily between meals.     lidocaine-prilocaine cream  Commonly known as:  EMLA  Apply 1 application topically as needed. To affect areas.     magic mouthwash w/lidocaine Soln    Take 5 mLs by mouth 4 (four) times daily as needed for mouth pain.     magnesium hydroxide 400 MG/5ML suspension  Commonly known as:  MILK OF MAGNESIA  Take 5 mLs by mouth daily as needed for mild constipation.     magnesium oxide 400 (241.3 MG) MG tablet  Commonly known as:  MAG-OX  TAKE 1 TABLET BY MOUTH TWICE A DAY     megestrol 40 MG/ML suspension  Commonly known as:  MEGACE  Take 10 mLs (400 mg total) by mouth daily.     metoprolol tartrate 12.5 mg Tabs tablet  Commonly known as:  LOPRESSOR  Take 0.5 tablets (12.5 mg total) by mouth 2 (two) times daily.     omeprazole 40 MG capsule  Commonly known as:  PRILOSEC  Take 40 mg by mouth daily.     prochlorperazine 10 MG tablet  Commonly known as:  COMPAZINE  Take 1 tablet (10 mg total) by mouth every 6 (six) hours as needed for nausea or vomiting.     Rivaroxaban 15 MG Tabs tablet  Commonly known as:  XARELTO  Take 1 tablet (15 mg total) by mouth 2 (two) times daily with a meal.     rivaroxaban 20 MG Tabs tablet  Commonly known as:  XARELTO  Take 1 tablet (20 mg total) by mouth daily with supper.  Start taking on:  09/23/2014  tiotropium 18 MCG inhalation capsule  Commonly known as:  SPIRIVA  Place 1 capsule (18 mcg total) into inhaler and inhale daily.     traMADol 50 MG tablet  Commonly known as:  ULTRAM  Take 1 tablet (50 mg total) by mouth every 6 (six) hours as needed.     valsartan-hydrochlorothiazide 160-25 MG per tablet  Commonly known as:  DIOVAN-HCT  Take 1 tablet by mouth daily as needed (If blood pressure is greater than 130/80).     vitamin E 1000 UNIT capsule  Generic drug:  vitamin E  Take 1,000 Units by mouth daily.       No Known Allergies Follow-up Information    Follow up with [name], MD. Schedule an appointment as soon as possible for a visit in 1 week.   Specialty:  Family Medicine   Why:  Hospital followup   Contact information:   [place] [address] [phone]       Follow up with Eilleen Kempf., MD. Schedule an appointment as soon as possible for a visit in 1 week.   Specialty:  Oncology   Why:  Hospital followup   Contact information:   [place] [address] [phone]        The results of significant diagnostics from this hospitalization (including imaging, microbiology, ancillary and laboratory) are listed below for reference.    Significant Diagnostic Studies: Dg Chest 2 View  09/14/2014   CLINICAL DATA:  Recent falls due to weakness. History of lung cancer.  EXAM: CHEST  2 VIEW  COMPARISON:  Chest CT 06/1914  FINDINGS: Port-A-Cath tip in the lower SVC region. Chronic changes at the lung bases compatible with bronchiectasis. New densities in the right mid lung are suggestive for fluid or densities in the right minor fissure. Heart size is normal. Negative for pneumothorax. Bony structures appear to be intact.  IMPRESSION: There is new fluid or densities in the right minor fissure region.  Chronic changes and bronchiectasis at the lung bases.   Electronically Signed   By: Markus Daft M.D.   On: 09/14/2014 20:18    Microbiology: Recent Results (from the past 240 hour(s))  Clostridium Difficile by PCR     Status: None   Collection Time: 09/15/14  8:59 AM  Result Value Ref Range Status   C difficile by pcr NEGATIVE NEGATIVE Final    Comment: Performed at [place]: Basic Metabolic Panel:  Recent Labs Lab 09/14/14 1932 09/15/14 0430 09/16/14 0508 09/17/14 0455 09/18/14 0610  NA 128* 131* 136* 138 137  K 3.2* 3.4* 3.0* 3.2* 3.7  CL 90* 95* 101 104 102  CO2 24 24 22 23 24   GLUCOSE 117* 98 95 95 104*  BUN 20 20 17 16 14   CREATININE 1.22 1.10 1.02 1.15 1.19  CALCIUM 8.4 7.6* 7.3* 7.6* 7.5*   Liver Function Tests:  Recent Labs Lab 09/14/14 1932  AST 33  ALT 25  ALKPHOS 54  BILITOT 1.4*  PROT 5.7*  ALBUMIN 2.4*   No results for  input(s): LIPASE, AMYLASE in the last 168 hours. No results for input(s): AMMONIA in the last 168 hours. CBC:  Recent Labs Lab 09/14/14 1932 09/15/14 0430 09/16/14 0508 09/17/14 0455 09/18/14 0610  WBC 1.2* 1.8* 3.1* 7.1 11.1*  NEUTROABS 0.2*  --   --   --   --   HGB 8.5* 7.7* 7.5* 7.8* 8.6*  HCT 25.3* 23.3* 21.9* 23.6* 24.9*  MCV 92.3 92.5 92.0 92.2 90.2  PLT 56* 48* 38* 46* 51*   Cardiac Enzymes:  Recent Labs Lab 09/14/14 1932  TROPONINI <0.30   BNP: BNP (last 3 results)  Recent Labs  09/15/14 1320 09/17/14 0455  PROBNP 2151.0* 642.5*   CBG:  Recent Labs Lab 09/16/14 0746 09/16/14 1204 09/17/14 0730 09/18/14 0747 09/19/14 0741  GLUCAP 92 96 90 115* 104*       Signed:  Ovella Manygoats  [facility] 09/19/2014, 12:44 PM

## 2014-09-19 NOTE — Progress Notes (Signed)
Patient is set to discharge to [facility] today. Patient &  grandaughter, [name] aware. Discharge packet given to RN, Estill Bamberg. [facility] called for transport to pickup at 2:30pm.   [facility] NOTE 09/19/2014  Patient:  [name] [name], [name] [name]  Account Number:  [id number] Admit date:  09/14/2014  Clinical Social Worker:  Renold Genta  Date/time:  09/16/2014 03:42 PM  Clinical Social Work is seeking post-discharge placement for this patient at the following level of care:   [place]   (*CSW will update this form in [place] as items are completed)   09/16/2014  Patient/family provided with [place] Department of Clinical Social Work's list of facilities offering this level of care within the geographic area requested by the patient (or if unable, by the patient's family).  09/16/2014  Patient/family informed of their freedom to choose among providers that offer the needed level of care, that participate in Medicare, Medicaid or managed care program needed by the patient, have an available bed and are willing to accept the patient.  09/16/2014  Patient/family informed of [facility]' ownership interest in [facility], as well as of the fact that they are under no obligation to receive care at this facility.  PASARR submitted to EDS on 09/16/2014 PASARR number received on 09/16/2014  FL2 transmitted to all facilities in geographic area requested by pt/family on  09/16/2014 FL2 transmitted to all facilities within larger geographic area on   Patient informed that his/her managed care company has contracts with or will negotiate with  certain facilities, including the following:     Patient/family informed of bed offers received:  09/16/2014 Patient chooses bed at [facility] Physician recommends and patient chooses bed at    Patient to be transferred to [facility] on   09/19/2014 Patient to be transferred to facility by [facility] Patient and family notified of transfer on 09/19/2014 Name of family member notified:  patient's grandaughter, [name] via phone  The following physician request were entered in Epic:   Additional Comments:   Raynaldo Opitz, [place] Social Worker cell #: [phone]

## 2014-09-19 NOTE — Discharge Instructions (Signed)
Deconditioning Deconditioning refers to the changes in your body that occur during a period of inactivity. Deconditioning results in changes to your heart, lungs, and muscles. These changes decrease your ability to endure activity, resulting in feelings of fatigue and weakness. Deconditioning can occur after only a few days of bed rest or inactivity. The longer the period of inactivity, the more severe your symptoms of deconditioning will be. After longer periods of inactivity, it will also take longer for you to return to your previous level of functioning. Deconditioning can be mild, moderate, or severe:  Mild. Your condition interferes with your ability to perform your usual types of exercise, such as running, biking, or swimming.  Moderate. Your condition interferes with your ability to do normal everyday activities. This may include walking, grocery shopping, or doing chores or lawn work.  Severe. Your condition interferes with your ability to perform minimal activity or normal self-care. CAUSES  Some common reasons for inactivity that may result in deconditioning include:  Illnesses, such as cancer, stroke, heart attack, fibromyalgia, or chronic fatigue syndrome.  Injuries, especially back injuries, broken bones, or injury to ligaments or tendons.  Surgery or a long stay in the hospital for any reason.  Pregnancy, especially with conditions that require long periods of bed rest. RISK FACTORS Anything that results in a period of hospitalization or bed rest will put you at risk of deconditioning. Some other factors that can increase the risk include:  Obesity.  Poor nutrition.  Old age.  Injuries or illnesses that interfere with movement and activity. SIGNS AND SYMPTOMS  Feeling weak.  Feeling tired.  Shortness of breath with minor exertion.  Your heart beating faster than normal. You may or may not notice this without taking your pulse.  Pain or discomfort with  activity.  Decreased strength.  Decreased sense of balance.  Decreased endurance.  Difficulty participating in usual forms of exercise.  Difficulty doing activities of daily living, such as grocery shopping or chores.  Difficulty walking around the house and doing basic self-care, such as getting to the bathroom, preparing meals, or doing laundry. DIAGNOSIS  There is no specific test to diagnose deconditioning. Your health care provider will take your medical history and do a physical exam. During the physical exam, the health care provider will check for signs of deconditioning, such as:  Decreased size of muscles.  Decreased strength.  Difficulty with balance.  Shortness of breath or abnormally increased heart rate after minor exertion. TREATMENT  Treatment usually involves a structured exercise program in which activity is increased gradually. Your health care provider will determine which exercises are right for you. The exercise program will likely include aerobic exercise and strength training. Aerobic exercise helps improve the functioning of the heart and lungs as well as the muscles. Strength training helps improve muscle size and strength. Both of these types of exercise will improve your endurance. You may be referred to a physical therapist who can create a safe strengthening program for you to follow. HOME CARE INSTRUCTIONS  Follow the exercise program recommended by your health care provider or physical therapist.  Do not increase your exercise any faster than directed.  Eat a healthy diet.  If your health care provider thinks that you need to lose weight, consider seeing a dietitian to help you do so in a healthy way.  Do not use any tobacco products, including cigarettes, chewing tobacco, or electronic cigarettes. If you need help quitting, ask your health care provider.  Take  medicines only as directed by your health care provider.  Keep all follow-up visits as  directed by your health care provider. This is important. SEEK MEDICAL CARE IF:  You are not able to carry out the prescribed exercise program.  You are not able to carry out your usual level of activity.  You are having trouble doing normal household chores or caring for yourself.  You are becoming increasingly fatigued and weak.  You become light-headed when rising to a sitting or standing position.  Your level of endurance decreases after having improved. SEEK IMMEDIATE MEDICAL CARE IF:  You have chest pain.  You are very short of breath.  You have any episodes of passing out. Document Released: 01/31/2014 Document Reviewed: 09/21/2013 [facility] Patient Information 2015 [place], [place]. This information is not intended to replace advice given to you by your health care provider. Make sure you discuss any questions you have with your health care provider. Heart Failure Heart failure means your heart has trouble pumping blood. This makes it hard for your body to work well. Heart failure is usually a long-term (chronic) condition. You must take good care of yourself and follow your doctor's treatment plan. HOME CARE  Take your heart medicine as told by your doctor.  Do not stop taking medicine unless your doctor tells you to.  Do not skip any dose of medicine.  Refill your medicines before they run out.  Take other medicines only as told by your doctor or pharmacist.  Stay active if told by your doctor. The elderly and people with severe heart failure should talk with a doctor about physical activity.  Eat heart-healthy foods. Choose foods that are without trans fat and are low in saturated fat, cholesterol, and salt (sodium). This includes fresh or frozen fruits and vegetables, fish, lean meats, fat-free or low-fat dairy foods, whole grains, and high-fiber foods. Lentils and dried peas and beans (legumes) are also good choices.  Limit salt if told by your doctor.  Cook in a  healthy way. Roast, grill, broil, bake, poach, steam, or stir-fry foods.  Limit fluids as told by your doctor.  Weigh yourself every morning. Do this after you pee (urinate) and before you eat breakfast. Write down your weight to give to your doctor.  Take your blood pressure and write it down if your doctor tells you to.  Ask your doctor how to check your pulse. Check your pulse as told.  Lose weight if told by your doctor.  Stop smoking or chewing tobacco. Do not use gum or patches that help you quit without your doctor's approval.  Schedule and go to doctor visits as told.  Nonpregnant women should have no more than 1 drink a day. Men should have no more than 2 drinks a day. Talk to your doctor about drinking alcohol.  Stop illegal drug use.  Stay current with shots (immunizations).  Manage your health conditions as told by your doctor.  Learn to manage your stress.  Rest when you are tired.  If it is really hot outside:  Avoid intense activities.  Use air conditioning or fans, or get in a cooler place.  Avoid caffeine and alcohol.  Wear loose-fitting, lightweight, and light-colored clothing.  If it is really cold outside:  Avoid intense activities.  Layer your clothing.  Wear mittens or gloves, a hat, and a scarf when going outside.  Avoid alcohol.  Learn about heart failure and get support as needed.  Get help to maintain or  improve your quality of life and your ability to care for yourself as needed. GET HELP IF:   You gain 03 lb/1.4 kg or more in 1 day or 05 lb/2.3 kg in a week.  You are more short of breath than usual.  You cannot do your normal activities.  You tire easily.  You cough more than normal, especially with activity.  You have any or more puffiness (swelling) in areas such as your hands, feet, ankles, or belly (abdomen).  You cannot sleep because it is hard to breathe.  You feel like your heart is beating fast  (palpitations).  You get dizzy or light-headed when you stand up. GET HELP RIGHT AWAY IF:   You have trouble breathing.  There is a change in mental status, such as becoming less alert or not being able to focus.  You have chest pain or discomfort.  You faint. MAKE SURE YOU:   Understand these instructions.  Will watch your condition.  Will get help right away if you are not doing well or get worse. Document Released: 06/25/2008 Document Revised: 01/31/2014 Document Reviewed: 11/02/2012 [facility] Patient Information 2015 [place], [place]. This information is not intended to replace advice given to you by your health care provider. Make sure you discuss any questions you have with your health care provider. Atrial Fibrillation Atrial fibrillation is a condition that causes your heart to beat irregularly. It may also cause your heart to beat faster than normal. Atrial fibrillation can prevent your heart from pumping blood normally. It increases your risk of stroke and heart problems. HOME CARE  Take medications as told by your doctor.  Only take medications that your doctor says are safe. Some medications can make the condition worse or happen again.  If blood thinners were prescribed by your doctor, take them exactly as told. Too much can cause bleeding. Too little and you will not have the needed protection against stroke and other problems.  Perform blood tests at home if told by your doctor.  Perform blood tests exactly as told by your doctor.  Do not drink alcohol.  Do not drink beverages with caffeine such as coffee, soda, and some teas.  Maintain a healthy weight.  Do not use diet pills unless your doctor says they are safe. They may make heart problems worse.  Follow diet instructions as told by your doctor.  Exercise regularly as told by your doctor.  Keep all follow-up appointments. GET HELP IF:  You notice a change in the speed, rhythm, or strength of your  heartbeat.  You suddenly begin peeing (urinating) more often.  You get tired more easily when moving or exercising. GET HELP RIGHT AWAY IF:   You have chest or belly (abdominal) pain.  You feel sick to your stomach (nauseous).  You are short of breath.  You suddenly have swollen feet and ankles.  You feel dizzy.  You face, arms, or legs feel numb or weak.  There is a change in your vision or speech. MAKE SURE YOU:   Understand these instructions.  Will watch your condition.  Will get help right away if you are not doing well or get worse. Document Released: 06/25/2008 Document Revised: 01/31/2014 Document Reviewed: 10/27/2012 [facility] Patient Information 2015 [place], [place]. This information is not intended to replace advice given to you by your health care provider. Make sure you discuss any questions you have with your health care provider.

## 2014-09-22 ENCOUNTER — Ambulatory Visit (HOSPITAL_COMMUNITY): Admission: RE | Admit: 2014-09-22 | Payer: Medicare Other | Source: Ambulatory Visit

## 2014-09-22 ENCOUNTER — Other Ambulatory Visit: Payer: Medicare Other

## 2014-09-23 ENCOUNTER — Other Ambulatory Visit: Payer: Self-pay | Admitting: Internal Medicine

## 2014-09-25 ENCOUNTER — Emergency Department (HOSPITAL_COMMUNITY): Payer: Medicare Other

## 2014-09-25 ENCOUNTER — Inpatient Hospital Stay (HOSPITAL_COMMUNITY): Payer: Medicare Other

## 2014-09-25 ENCOUNTER — Inpatient Hospital Stay (HOSPITAL_COMMUNITY)
Admission: EM | Admit: 2014-09-25 | Discharge: 2014-10-11 | DRG: 023 | Disposition: A | Discharge status: Skilled Nursing Facility | Payer: Medicare Other | Attending: Internal Medicine | Admitting: Internal Medicine

## 2014-09-25 ENCOUNTER — Encounter (HOSPITAL_COMMUNITY): Payer: Self-pay | Admitting: Emergency Medicine

## 2014-09-25 DIAGNOSIS — B9689 Other specified bacterial agents as the cause of diseases classified elsewhere: Secondary | ICD-10-CM | POA: Diagnosis present

## 2014-09-25 DIAGNOSIS — C159 Malignant neoplasm of esophagus, unspecified: Secondary | ICD-10-CM | POA: Diagnosis present

## 2014-09-25 DIAGNOSIS — K219 Gastro-esophageal reflux disease without esophagitis: Secondary | ICD-10-CM | POA: Diagnosis present

## 2014-09-25 DIAGNOSIS — I10 Essential (primary) hypertension: Secondary | ICD-10-CM | POA: Diagnosis present

## 2014-09-25 DIAGNOSIS — Z923 Personal history of irradiation: Secondary | ICD-10-CM

## 2014-09-25 DIAGNOSIS — Z9221 Personal history of antineoplastic chemotherapy: Secondary | ICD-10-CM | POA: Diagnosis not present

## 2014-09-25 DIAGNOSIS — K5900 Constipation, unspecified: Secondary | ICD-10-CM | POA: Diagnosis not present

## 2014-09-25 DIAGNOSIS — I482 Chronic atrial fibrillation: Secondary | ICD-10-CM

## 2014-09-25 DIAGNOSIS — R569 Unspecified convulsions: Secondary | ICD-10-CM

## 2014-09-25 DIAGNOSIS — K2270 Barrett's esophagus without dysplasia: Secondary | ICD-10-CM | POA: Diagnosis present

## 2014-09-25 DIAGNOSIS — C787 Secondary malignant neoplasm of liver and intrahepatic bile duct: Secondary | ICD-10-CM | POA: Diagnosis present

## 2014-09-25 DIAGNOSIS — E860 Dehydration: Secondary | ICD-10-CM | POA: Diagnosis present

## 2014-09-25 DIAGNOSIS — G40401 Other generalized epilepsy and epileptic syndromes, not intractable, with status epilepticus: Secondary | ICD-10-CM | POA: Diagnosis present

## 2014-09-25 DIAGNOSIS — G40111 Localization-related (focal) (partial) symptomatic epilepsy and epileptic syndromes with simple partial seizures, intractable, with status epilepticus: Secondary | ICD-10-CM

## 2014-09-25 DIAGNOSIS — G060 Intracranial abscess and granuloma: Secondary | ICD-10-CM | POA: Diagnosis present

## 2014-09-25 DIAGNOSIS — D638 Anemia in other chronic diseases classified elsewhere: Secondary | ICD-10-CM | POA: Diagnosis present

## 2014-09-25 DIAGNOSIS — E43 Unspecified severe protein-calorie malnutrition: Secondary | ICD-10-CM | POA: Diagnosis present

## 2014-09-25 DIAGNOSIS — G40409 Other generalized epilepsy and epileptic syndromes, not intractable, without status epilepticus: Secondary | ICD-10-CM | POA: Diagnosis present

## 2014-09-25 DIAGNOSIS — N400 Enlarged prostate without lower urinary tract symptoms: Secondary | ICD-10-CM | POA: Diagnosis present

## 2014-09-25 DIAGNOSIS — G8191 Hemiplegia, unspecified affecting right dominant side: Secondary | ICD-10-CM | POA: Diagnosis present

## 2014-09-25 DIAGNOSIS — D649 Anemia, unspecified: Secondary | ICD-10-CM | POA: Diagnosis present

## 2014-09-25 DIAGNOSIS — Z86718 Personal history of other venous thrombosis and embolism: Secondary | ICD-10-CM | POA: Diagnosis not present

## 2014-09-25 DIAGNOSIS — C7951 Secondary malignant neoplasm of bone: Secondary | ICD-10-CM | POA: Diagnosis present

## 2014-09-25 DIAGNOSIS — R0902 Hypoxemia: Secondary | ICD-10-CM

## 2014-09-25 DIAGNOSIS — I5032 Chronic diastolic (congestive) heart failure: Secondary | ICD-10-CM | POA: Diagnosis present

## 2014-09-25 DIAGNOSIS — I480 Paroxysmal atrial fibrillation: Secondary | ICD-10-CM | POA: Diagnosis present

## 2014-09-25 DIAGNOSIS — J9601 Acute respiratory failure with hypoxia: Secondary | ICD-10-CM | POA: Diagnosis present

## 2014-09-25 DIAGNOSIS — R296 Repeated falls: Secondary | ICD-10-CM | POA: Diagnosis present

## 2014-09-25 DIAGNOSIS — E785 Hyperlipidemia, unspecified: Secondary | ICD-10-CM | POA: Diagnosis present

## 2014-09-25 DIAGNOSIS — Z87891 Personal history of nicotine dependence: Secondary | ICD-10-CM

## 2014-09-25 DIAGNOSIS — D496 Neoplasm of unspecified behavior of brain: Secondary | ICD-10-CM

## 2014-09-25 DIAGNOSIS — Z7901 Long term (current) use of anticoagulants: Secondary | ICD-10-CM

## 2014-09-25 DIAGNOSIS — G936 Cerebral edema: Secondary | ICD-10-CM | POA: Diagnosis present

## 2014-09-25 DIAGNOSIS — Z6828 Body mass index (BMI) 28.0-28.9, adult: Secondary | ICD-10-CM

## 2014-09-25 DIAGNOSIS — D72829 Elevated white blood cell count, unspecified: Secondary | ICD-10-CM | POA: Diagnosis present

## 2014-09-25 DIAGNOSIS — G40109 Localization-related (focal) (partial) symptomatic epilepsy and epileptic syndromes with simple partial seizures, not intractable, without status epilepticus: Secondary | ICD-10-CM

## 2014-09-25 DIAGNOSIS — J9621 Acute and chronic respiratory failure with hypoxia: Secondary | ICD-10-CM | POA: Diagnosis present

## 2014-09-25 DIAGNOSIS — Z79899 Other long term (current) drug therapy: Secondary | ICD-10-CM

## 2014-09-25 DIAGNOSIS — C7931 Secondary malignant neoplasm of brain: Secondary | ICD-10-CM | POA: Diagnosis present

## 2014-09-25 DIAGNOSIS — I82409 Acute embolism and thrombosis of unspecified deep veins of unspecified lower extremity: Secondary | ICD-10-CM | POA: Diagnosis present

## 2014-09-25 DIAGNOSIS — R739 Hyperglycemia, unspecified: Secondary | ICD-10-CM | POA: Diagnosis not present

## 2014-09-25 DIAGNOSIS — R5381 Other malaise: Secondary | ICD-10-CM | POA: Diagnosis present

## 2014-09-25 DIAGNOSIS — Z9889 Other specified postprocedural states: Secondary | ICD-10-CM | POA: Diagnosis not present

## 2014-09-25 DIAGNOSIS — I4891 Unspecified atrial fibrillation: Secondary | ICD-10-CM | POA: Diagnosis present

## 2014-09-25 DIAGNOSIS — G9389 Other specified disorders of brain: Secondary | ICD-10-CM

## 2014-09-25 LAB — CBC
HCT: 29.2 % — ABNORMAL LOW (ref 39.0–52.0)
Hemoglobin: 9.8 g/dL — ABNORMAL LOW (ref 13.0–17.0)
MCH: 30.6 pg (ref 26.0–34.0)
MCHC: 33.6 g/dL (ref 30.0–36.0)
MCV: 91.3 fL (ref 78.0–100.0)
PLATELETS: 241 10*3/uL (ref 150–400)
RBC: 3.2 MIL/uL — ABNORMAL LOW (ref 4.22–5.81)
RDW: 19.4 % — ABNORMAL HIGH (ref 11.5–15.5)
WBC: 12.8 10*3/uL — ABNORMAL HIGH (ref 4.0–10.5)

## 2014-09-25 LAB — I-STAT CHEM 8, ED
BUN: 17 mg/dL (ref 6–23)
CALCIUM ION: 1.18 mmol/L (ref 1.13–1.30)
CHLORIDE: 104 meq/L (ref 96–112)
Creatinine, Ser: 1.2 mg/dL (ref 0.50–1.35)
Glucose, Bld: 107 mg/dL — ABNORMAL HIGH (ref 70–99)
HEMATOCRIT: 30 % — AB (ref 39.0–52.0)
Hemoglobin: 10.2 g/dL — ABNORMAL LOW (ref 13.0–17.0)
Potassium: 4.1 mmol/L (ref 3.5–5.1)
SODIUM: 137 mmol/L (ref 135–145)
TCO2: 19 mmol/L (ref 0–100)

## 2014-09-25 LAB — DIFFERENTIAL
BASOS PCT: 0 % (ref 0–1)
Basophils Absolute: 0 10*3/uL (ref 0.0–0.1)
EOS ABS: 0 10*3/uL (ref 0.0–0.7)
Eosinophils Relative: 0 % (ref 0–5)
Lymphocytes Relative: 8 % — ABNORMAL LOW (ref 12–46)
Lymphs Abs: 1.1 10*3/uL (ref 0.7–4.0)
Monocytes Absolute: 1.1 10*3/uL — ABNORMAL HIGH (ref 0.1–1.0)
Monocytes Relative: 8 % (ref 3–12)
Neutro Abs: 10.6 10*3/uL — ABNORMAL HIGH (ref 1.7–7.7)
Neutrophils Relative %: 84 % — ABNORMAL HIGH (ref 43–77)

## 2014-09-25 LAB — URINALYSIS, ROUTINE W REFLEX MICROSCOPIC
BILIRUBIN URINE: NEGATIVE
Glucose, UA: NEGATIVE mg/dL
Hgb urine dipstick: NEGATIVE
Ketones, ur: NEGATIVE mg/dL
LEUKOCYTES UA: NEGATIVE
Nitrite: NEGATIVE
PROTEIN: NEGATIVE mg/dL
Specific Gravity, Urine: 1.013 (ref 1.005–1.030)
Urobilinogen, UA: 0.2 mg/dL (ref 0.0–1.0)
pH: 6.5 (ref 5.0–8.0)

## 2014-09-25 LAB — COMPREHENSIVE METABOLIC PANEL
ALK PHOS: 80 U/L (ref 39–117)
ALT: 15 U/L (ref 0–53)
AST: 22 U/L (ref 0–37)
Albumin: 2.5 g/dL — ABNORMAL LOW (ref 3.5–5.2)
Anion gap: 9 (ref 5–15)
BUN: 15 mg/dL (ref 6–23)
CO2: 18 mmol/L — ABNORMAL LOW (ref 19–32)
Calcium: 8.7 mg/dL (ref 8.4–10.5)
Chloride: 108 mEq/L (ref 96–112)
Creatinine, Ser: 1.21 mg/dL (ref 0.50–1.35)
GFR calc Af Amer: 69 mL/min — ABNORMAL LOW (ref >90)
GFR calc non Af Amer: 60 mL/min — ABNORMAL LOW (ref >90)
Glucose, Bld: 108 mg/dL — ABNORMAL HIGH (ref 70–99)
POTASSIUM: 4.2 mmol/L (ref 3.5–5.1)
SODIUM: 135 mmol/L (ref 135–145)
TOTAL PROTEIN: 5.2 g/dL — AB (ref 6.0–8.3)
Total Bilirubin: 0.5 mg/dL (ref 0.3–1.2)

## 2014-09-25 LAB — ETHANOL

## 2014-09-25 LAB — PROTIME-INR
INR: 2.38 — AB (ref 0.00–1.49)
PROTHROMBIN TIME: 26.2 s — AB (ref 11.6–15.2)

## 2014-09-25 LAB — CK: CK TOTAL: 91 U/L (ref 7–232)

## 2014-09-25 LAB — APTT: aPTT: 33 seconds (ref 24–37)

## 2014-09-25 LAB — I-STAT TROPONIN, ED: TROPONIN I, POC: 0.04 ng/mL (ref 0.00–0.08)

## 2014-09-25 LAB — MRSA PCR SCREENING: MRSA by PCR: NEGATIVE

## 2014-09-25 MED ORDER — TIOTROPIUM BROMIDE MONOHYDRATE 18 MCG IN CAPS
18.0000 ug | ORAL_CAPSULE | Freq: Every day | RESPIRATORY_TRACT | Status: DC
  Administered 2014-09-27 – 2014-10-11 (×12): 18 ug via RESPIRATORY_TRACT
  Filled 2014-09-25 (×5): qty 5

## 2014-09-25 MED ORDER — GADOBENATE DIMEGLUMINE 529 MG/ML IV SOLN
18.0000 mL | Freq: Once | INTRAVENOUS | Status: AC | PRN
  Administered 2014-09-25: 18 mL via INTRAVENOUS

## 2014-09-25 MED ORDER — ALBUTEROL SULFATE (2.5 MG/3ML) 0.083% IN NEBU: 2.5000 mg | INHALATION_SOLUTION | RESPIRATORY_TRACT | Status: DC | PRN

## 2014-09-25 MED ORDER — SODIUM CHLORIDE 0.9 % IV BOLUS (SEPSIS)
500.0000 mL | Freq: Once | INTRAVENOUS | Status: AC
  Administered 2014-09-25: 500 mL via INTRAVENOUS

## 2014-09-25 MED ORDER — HEPARIN BOLUS VIA INFUSION
4000.0000 [IU] | Freq: Once | INTRAVENOUS | Status: AC
  Administered 2014-09-25: 4000 [IU] via INTRAVENOUS
  Filled 2014-09-25: qty 4000

## 2014-09-25 MED ORDER — PHENYTOIN SODIUM 50 MG/ML IJ SOLN
1600.0000 mg | INTRAMUSCULAR | Status: AC
  Administered 2014-09-25: 1600 mg via INTRAVENOUS
  Filled 2014-09-25: qty 32

## 2014-09-25 MED ORDER — LEVETIRACETAM IN NACL 1500 MG/100ML IV SOLN
1500.0000 mg | INTRAVENOUS | Status: AC
  Administered 2014-09-25: 1500 mg via INTRAVENOUS
  Filled 2014-09-25: qty 100

## 2014-09-25 MED ORDER — LORAZEPAM 2 MG/ML IJ SOLN
INTRAMUSCULAR | Status: AC
  Administered 2014-09-25: 2 mg
  Filled 2014-09-25: qty 1

## 2014-09-25 MED ORDER — HEPARIN (PORCINE) IN NACL 100-0.45 UNIT/ML-% IJ SOLN
1350.0000 [IU]/h | INTRAMUSCULAR | Status: DC
  Administered 2014-09-25: 1350 [IU]/h via INTRAVENOUS
  Filled 2014-09-25 (×2): qty 250

## 2014-09-25 MED ORDER — LEVETIRACETAM IN NACL 1000 MG/100ML IV SOLN
1000.0000 mg | Freq: Two times a day (BID) | INTRAVENOUS | Status: DC
  Administered 2014-09-25: 1000 mg via INTRAVENOUS
  Filled 2014-09-25 (×2): qty 100

## 2014-09-25 MED ORDER — LEVETIRACETAM IN NACL 500 MG/100ML IV SOLN
500.0000 mg | Freq: Two times a day (BID) | INTRAVENOUS | Status: DC
  Filled 2014-09-25: qty 100

## 2014-09-25 MED ORDER — SODIUM CHLORIDE 0.9 % IV SOLN
INTRAVENOUS | Status: DC
  Administered 2014-09-25 – 2014-10-01 (×5): via INTRAVENOUS

## 2014-09-25 MED ORDER — ONDANSETRON HCL 4 MG/2ML IJ SOLN
4.0000 mg | Freq: Four times a day (QID) | INTRAMUSCULAR | Status: DC | PRN
  Administered 2014-09-27: 4 mg via INTRAVENOUS
  Filled 2014-09-25: qty 2

## 2014-09-25 MED ORDER — ONDANSETRON HCL 4 MG PO TABS: 4.0000 mg | ORAL_TABLET | Freq: Four times a day (QID) | ORAL | Status: DC | PRN

## 2014-09-25 MED ORDER — DEXAMETHASONE SODIUM PHOSPHATE 4 MG/ML IJ SOLN
4.0000 mg | Freq: Four times a day (QID) | INTRAMUSCULAR | Status: DC
  Administered 2014-09-25 – 2014-10-02 (×28): 4 mg via INTRAVENOUS
  Filled 2014-09-25 (×35): qty 1

## 2014-09-25 MED ORDER — SODIUM CHLORIDE 0.9 % IV SOLN: INTRAVENOUS | Status: AC

## 2014-09-25 NOTE — ED Notes (Signed)
Swallow screen held at this time due to drowsiness. Notified Vicente Males, Rn on Stepdown.

## 2014-09-25 NOTE — Progress Notes (Signed)
ANTICOAGULATION CONSULT NOTE - Initial Consult  Pharmacy Consult for Heparin Indication: VTE treatment  No Known Allergies  Patient Measurements: Weight: 189 lb 13.1 oz (86.1 kg)  Vital Signs: Temp: 97.5 F (36.4 C) (12/27 1644) Temp Source: Axillary (12/27 1644) BP: 87/50 mmHg (12/27 1700) Pulse Rate: 64 (12/27 1700)  Labs:  Recent Labs  09/25/14 1221 09/25/14 1227  HGB 9.8* 10.2*  HCT 29.2* 30.0*  PLT 241  --   APTT 33  --   LABPROT 26.2*  --   INR 2.38*  --   CREATININE 1.21 1.20    Estimated Creatinine Clearance: 61.8 mL/min (by C-G formula based on Cr of 1.2).   Medical History: Past Medical History  Diagnosis Date  . GERD (gastroesophageal reflux disease)   . Hyperlipemia   . Barrett's esophagus 12/2013  . History of radiation therapy 02/23/14- 04/04/14    esophagus 5040 cGy 28 sessions  . 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 Hypertension     Medications:  Prescriptions prior to admission  Medication Sig Dispense Refill Last Dose  . tiotropium (SPIRIVA) 18 MCG inhalation capsule Place 1 capsule (18 mcg total) into inhaler and inhale daily. 30 capsule 6 09/25/2014 at Unknown time  . [DISCONTINUED] Alum & Mag Hydroxide-Simeth (MAGIC MOUTHWASH W/LIDOCAINE) SOLN Take 5 mLs by mouth 4 (four) times daily as needed for mouth pain.  0 unknown  . [DISCONTINUED] antiseptic oral rinse (BIOTENE) LIQD 15 mLs by Mouth Rinse route as needed for dry mouth.   unknown  . [DISCONTINUED] dutasteride (AVODART) 0.5 MG capsule Take 0.5 mg by mouth every evening.    09/19/2014  . [DISCONTINUED] finasteride (PROSCAR) 5 MG tablet Take 5 mg by mouth daily.   09/24/2014 at Unknown time  . [DISCONTINUED] Garlic 4782 MG CAPS Take 1 capsule by mouth daily.   09/25/2014 at Unknown time  . [DISCONTINUED] lactose free nutrition (BOOST PLUS) LIQD Take 237 mLs by mouth 2 (two) times daily between meals.  0 09/24/2014 at Unknown time  . [DISCONTINUED] lidocaine-prilocaine (EMLA)  cream Apply 1 application topically as needed. To affect areas. 30 g 0 unknown  . [DISCONTINUED] magnesium hydroxide (MILK OF MAGNESIA) 400 MG/5ML suspension Take 5 mLs by mouth daily as needed for mild constipation.   unknown at Unknown time  . [DISCONTINUED] magnesium oxide (MAG-OX) 400 (241.3 MG) MG tablet TAKE 1 TABLET BY MOUTH TWICE A DAY 60 tablet 0 09/25/2014 at Unknown time  . [DISCONTINUED] megestrol (MEGACE) 40 MG/ML suspension Take 10 mLs (400 mg total) by mouth daily. 240 mL 0 09/24/2014 at Unknown time  . [DISCONTINUED] metoprolol tartrate (LOPRESSOR) 12.5 mg TABS tablet Take 0.5 tablets (12.5 mg total) by mouth 2 (two) times daily.   09/25/2014 at 0900  . [DISCONTINUED] omeprazole (PRILOSEC) 40 MG capsule Take 40 mg by mouth daily.   09/25/2014 at Unknown time  . [DISCONTINUED] prochlorperazine (COMPAZINE) 10 MG tablet Take 1 tablet (10 mg total) by mouth every 6 (six) hours as needed for nausea or vomiting. 30 tablet 1 unknown  . [DISCONTINUED] rivaroxaban (XARELTO) 20 MG TABS tablet Take 1 tablet (20 mg total) by mouth daily with supper. 30 tablet 0 09/24/2014 at Unknown time  . [DISCONTINUED] traMADol (ULTRAM) 50 MG tablet Take 1 tablet (50 mg total) by mouth every 6 (six) hours as needed. 30 tablet 0 09/25/2014 at Unknown time  . [DISCONTINUED] valsartan-hydrochlorothiazide (DIOVAN-HCT) 160-25 MG per tablet Take 1 tablet by mouth daily as needed (If blood pressure is greater than  130/80).    unknown  . [DISCONTINUED] vitamin E (VITAMIN E) 1000 UNIT capsule Take 1,000 Units by mouth daily.   09/25/2014 at Unknown time  . Rivaroxaban (XARELTO) 15 MG TABS tablet Take 1 tablet (15 mg total) by mouth 2 (two) times daily with a meal. 8 tablet 0   . [DISCONTINUED] dexamethasone (DECADRON) 4 MG tablet 2 tab po bid the day before, day of and day after chemo every 3 weeks. (Patient not taking: Reported on 09/25/2014) 40 tablet 1 Past Week at Unknown time   Scheduled:  . sodium chloride    Intravenous STAT  . dexamethasone  4 mg Intravenous 4 times per day  . tiotropium  18 mcg Inhalation Daily   Infusions:  . sodium chloride      Assessment: 68yo male presents with seizure. Pharmacy is consulted to dose heparin for VTE treatment. Hgb 10.2, Plt 241. CT head found no evidence of acute hemorrhage.  Goal of Therapy:  Heparin level 0.3-0.7 units/ml Monitor platelets by anticoagulation protocol: Yes   Plan:  Give 4000 units bolus x 1 Start heparin infusion at 1350 units/hr Check anti-Xa level in 6 hours and daily while on heparin Continue to monitor H&H and platelets  Monitor s/sx of bleeding  Andrey Cota. Diona Foley, PharmD Clinical Pharmacist Pager [phone] 09/25/2014,5:32 PM

## 2014-09-25 NOTE — ED Notes (Signed)
Notified [facility] of blood pressure 68/45; stopped dilantin and started 500cc bolus per orders.

## 2014-09-25 NOTE — ED Notes (Signed)
2mg  ativan given via verbal order from Dr. Leonel Ramsay.

## 2014-09-25 NOTE — ED Notes (Signed)
From [facility]: Per EMS, physical therapy at facility noted some right arm flaccidity during session. At 1100 normal weakness. At 1137 EMS called for focal seizures, right arm flaccidity, and right arm twitching. First responders witnessed grand mal seizure lasting 3-4 minutes. EMS arrived gave 5mg  Versed IV. EMS reports talking, A&Ox4 in route, with twitching of right arm continuing. Leonel Ramsay, MD at bridge on arrival, ordered 2mg  Ativan to be given and take to CT. A&O x4 prior to administration of Ativan 2mg  (given at 1226). Currently resting with eyes closed, twitching resolved. Xarelto started on 12/25 per nursing home paperwork.

## 2014-09-25 NOTE — ED Notes (Signed)
Spoke with Dr. Leonel Ramsay. He states hold the remainder of this dose of Dilantin. Transfer to step down once pressure is 85 systolic. Do not restart this dose of Dilantin. Re-evaluate if jerk becomes more pronounced again (currently jerk is very mild, almost imperceptible).

## 2014-09-25 NOTE — Consult Note (Addendum)
Neurology Consultation Reason for Consult: Seizure Referring Physician: Towanda Malkin  CC: Seizure  History is obtained from: Patient, medical record  HPI: [name] [name] is a 68 y.o. male with a history of esophageal cancer who presents with focal seizures that started earlier today. He states that he felt weak prior to the seizure starting, but then the seizures have been going on and are not stopping and therefore EMS was called. On arrival, he was having focal twitching of the right arm and face with preservation of consciousness. CT of his head revealed a large likely metastatic lesion.  ROS: A 14 point ROS was difficult to obtain due to ongoing seizure  Past Medical History  Diagnosis Date  . GERD (gastroesophageal reflux disease)   . Hyperlipemia   . Barrett's esophagus 12/2013  . History of radiation therapy 02/23/14- 04/04/14    esophagus 5040 cGy 28 sessions  . 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 Hypertension     Family History: No history of similar  Social History: Tob: Former smoker  Exam: Current vital signs: BP 79/50 mmHg  Pulse 67  Temp(Src) 97.5 F (36.4 C) (Axillary)  Resp 16  Wt 86.1 kg (189 lb 13.1 oz)  SpO2 100% Vital signs in last 24 hours: Temp:  [97.5 F (36.4 C)-98.2 F (36.8 C)] 97.5 F (36.4 C) (12/27 1644) Pulse Rate:  [55-99] 67 (12/27 1800) Resp:  [8-20] 16 (12/27 1800) BP: (69-149)/(48-113) 79/50 mmHg (12/27 1800) SpO2:  [98 %-100 %] 100 % (12/27 1800) Weight:  [86.1 kg (189 lb 13.1 oz)] 86.1 kg (189 lb 13.1 oz) (12/27 1535)   Physical Exam  Constitutional: Appears well-developed and well-nourished.  Psych: Affect appropriate to situation Eyes: No scleral injection HENT: No OP obstrucion Head: Normocephalic.  Cardiovascular: Normal rate and regular rhythm.  Respiratory: Effort normal and breath sounds normal to anterior ascultation GI: Soft.  No distension. There is no tenderness.  Skin: WDI  Neuro: Mental  Status: Patient is awake, alert, oriented to person, place, month, year, and situation. No clear signs of aphasia, however it is somewhat difficult to assess given that he has repeated twitching of his right face and therefore is difficult to understand at times. No signs of neglect Cranial Nerves: II: Visual Fields are full. Pupils are equal, round, and reactive to light.   III,IV, VI: EOMI without ptosis or diploplia.  V: Facial sensation is symmetric to temperature VII: Facial movement is notable for right facial twitching and weakness VIII: hearing is intact to voice X: Uvula elevates symmetrically XI: Shoulder shrug is symmetric. XII: tongue is midline without atrophy or fasciculations.  Motor: Tone is normal. Bulk is normal. 5/5 strength was present on the left, on the right he has significant weakness and twitching of his right arm, some voluntary movement remaining in his right leg with less twitching. Sensory: Sensation is decreased on the right side Deep Tendon Reflexes: 2+ and symmetric in the biceps and patellae.       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 Given the preserved consciousness, I would not favor proceeding to burst suppression but will treat seizures. He has had significant improvement in movements with Keppra and Dilantin, however he has drops pressures with Dilantin. We'll continue Keppra for now but  if he continues to have the movements, than other agents may need to be added.  Recommendations: 1) Ativan 2 mg 1 2) Keppra 1500 mg 1 then 1000 mg twice a day 3) will continue to follow 4) agree with Decadron  Roland Rack, MD [facility] [phone]  If 7pm- 7am, please  page neurology on call as listed in [place].

## 2014-09-25 NOTE — ED Provider Notes (Signed)
CSN: [id number]     Arrival date & time 09/25/14  1219 History   First MD Initiated Contact with Patient 09/25/14 1223     Chief Complaint  Patient presents with  . Seizures      Patient is a 68 y.o. male presenting with seizures. The history is provided by the patient, the nursing home and the EMS personnel. The history is limited by the condition of the patient (focal seizure).  Seizures Pt was seen at 1240. Per EMS, NH report: PT at pt's facility noted RUE flaccid during their tx session this morning. EMS was called to NH shortly afterwards for focal seizure to RUE and right face. While en route, EMS noted pt to start with RUE focal seizure, then have a generalized tonic-clonic seizure lasting approximately 3 to 4 minutes. EMS gave IV versed with improvement. EMS called code stroke en route. Upon arrival to ED, pt awake/alert with right facial and RUE focal seizure.   Past Medical History  Diagnosis Date  . GERD (gastroesophageal reflux disease)   . Hyperlipemia   . Barrett's esophagus 12/2013  . History of radiation therapy 02/23/14- 04/04/14    esophagus 5040 cGy 28 sessions  . 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 Hypertension    Past Surgical History  Procedure Laterality Date  . Eus N/A 02/03/2014    Procedure: UPPER ENDOSCOPIC ULTRASOUND (EUS) LINEAR;  Surgeon: Milus Banister, MD;  Location: [facility];  Service: Endoscopy;  Laterality: N/A;   Family History  Problem Relation Age of Onset  . Breast cancer Sister    History  Substance Use Topics  . Smoking status: Former Smoker -- 2.00 packs/day for 35 years    Types: Cigarettes    Quit date: 09/30/1988  . Smokeless tobacco: Never Used     Comment: pt thinks he stopped smoking in 1990s  . Alcohol Use: Yes     Comment: occasional, history of alcohol abuse    Review of Systems  Unable to perform ROS: Acuity of condition  Neurological: Positive for seizures.      Allergies  Review of patient's allergies  indicates no known allergies.  Home Medications   Prior to Admission medications   Medication Sig Start Date End Date Taking? Authorizing Provider  Alum & Mag Hydroxide-Simeth (MAGIC MOUTHWASH W/LIDOCAINE) SOLN Take 5 mLs by mouth 4 (four) times daily as needed for mouth pain. 09/19/14   Maryann Mikhail, DO  antiseptic oral rinse (BIOTENE) LIQD 15 mLs by Mouth Rinse route as needed for dry mouth. 09/19/14   Maryann Mikhail, DO  dexamethasone (DECADRON) 4 MG tablet 2 tab po bid the day before, day of and day after chemo every 3 weeks. 06/13/14   Drue Second, NP  dutasteride (AVODART) 0.5 MG capsule Take 0.5 mg by mouth every evening.     Historical Provider, MD  Garlic 7741 MG CAPS Take 1 capsule by mouth daily.    Historical Provider, MD  lactose free nutrition (BOOST PLUS) LIQD Take 237 mLs by mouth 2 (two) times daily between meals. 09/19/14   Maryann Mikhail, DO  lidocaine-prilocaine (EMLA) cream Apply 1 application topically as needed. To affect areas. 09/19/14   Maryann Mikhail, DO  magnesium hydroxide (MILK OF MAGNESIA) 400 MG/5ML suspension Take 5 mLs by mouth daily as needed for mild constipation.    Historical Provider, MD  magnesium oxide (MAG-OX) 400 (241.3 MG) MG tablet TAKE 1 TABLET BY MOUTH TWICE A DAY 09/23/14   Curt Bears, MD  megestrol (MEGACE) 40 MG/ML suspension Take 10 mLs (400 mg total) by mouth daily. 09/19/14   Maryann Mikhail, DO  metoprolol tartrate (LOPRESSOR) 12.5 mg TABS tablet Take 0.5 tablets (12.5 mg total) by mouth 2 (two) times daily. 09/19/14   Maryann Mikhail, DO  omeprazole (PRILOSEC) 40 MG capsule Take 40 mg by mouth daily.    Historical Provider, MD  prochlorperazine (COMPAZINE) 10 MG tablet Take 1 tablet (10 mg total) by mouth every 6 (six) hours as needed for nausea or vomiting. 02/28/14   Curt Bears, MD  Rivaroxaban (XARELTO) 15 MG TABS tablet Take 1 tablet (15 mg total) by mouth 2 (two) times daily with a meal. 09/19/14 09/22/14  Maryann Mikhail,  DO  rivaroxaban (XARELTO) 20 MG TABS tablet Take 1 tablet (20 mg total) by mouth daily with supper. 09/23/14   Maryann Mikhail, DO  tiotropium (SPIRIVA) 18 MCG inhalation capsule Place 1 capsule (18 mcg total) into inhaler and inhale daily. Patient not taking: Reported on 09/14/2014 08/18/14   Brand Males, MD  traMADol (ULTRAM) 50 MG tablet Take 1 tablet (50 mg total) by mouth every 6 (six) hours as needed. 09/19/14   Maryann Mikhail, DO  valsartan-hydrochlorothiazide (DIOVAN-HCT) 160-25 MG per tablet Take 1 tablet by mouth daily as needed (If blood pressure is greater than 130/80).     Historical Provider, MD  vitamin E (VITAMIN E) 1000 UNIT capsule Take 1,000 Units by mouth daily.    Historical Provider, MD   BP 149/113 mmHg  Pulse 99  Temp(Src) 98.2 F (36.8 C) (Oral)  Resp 16  SpO2 98% Physical Exam 1245: Physical examination:  Nursing notes reviewed; Vital signs and O2 SAT reviewed;  Constitutional: Well developed, Well nourished, In no acute distress; Head:  Normocephalic, atraumatic; Eyes: EOMI, PERRL, No scleral icterus; ENMT: Mouth and pharynx normal, Mucous membranes dry;; Neck: Supple, Full range of motion, No lymphadenopathy; Cardiovascular: Regular rate and rhythm, No gallop; Respiratory: Breath sounds clear & equal bilaterally, No wheezes. Pt will answer questions in short sentences. Normal respiratory effort/excursion; Chest: Nontender, Movement normal; Abdomen: Soft, Nontender, Nondistended, Normal bowel sounds; Genitourinary: No CVA tenderness; Extremities: Pulses normal, No deformity, No edema, No calf edema or asymmetry.; Neuro: Awake, alert. +right facial droop and muscle twitching. Speech slurred. Pt will move LUE and LLE on stretcher spontaneously and to command. +RLE flaccid. +RUE flaccid, with rhythmic twitching.; Skin: Color normal, Warm, Dry.    ED Course  Procedures     EKG Interpretation None      MDM  MDM Reviewed: previous chart, nursing note and  vitals Reviewed previous: labs and ECG Interpretation: labs, CT scan and ECG Total time providing critical care: 30-74 minutes. This excludes time spent performing separately reportable procedures and services. Consults: neurology and admitting MD   CRITICAL CARE Performed by: Alfonzo Feller Total critical care time: 35 Critical care time was exclusive of separately billable procedures and treating other patients. Critical care was necessary to treat or prevent imminent or life-threatening deterioration. Critical care was time spent personally by me on the following activities: development of treatment plan with patient and/or surrogate as well as nursing, discussions with consultants, evaluation of patient's response to treatment, examination of patient, obtaining history from patient or surrogate, ordering and performing treatments and interventions, ordering and review of laboratory studies, ordering and review of radiographic studies, pulse oximetry and re-evaluation of patient's condition.    Date: 09/25/2014   Rate: 97  Rhythm: normal sinus rhythm and premature ventricular contractions (PVC)  QRS Axis: normal  Intervals: QT prolonged  ST/T Wave abnormalities: nonspecific ST/T changes  Conduction Disutrbances:none  Narrative Interpretation:   Old EKG Reviewed: changes noted; QT now prolonged compared to previous EKG dated 09/16/14.    Results for orders placed or performed during the hospital encounter of 09/25/14  Protime-INR  Result Value Ref Range   Prothrombin Time 26.2 (H) 11.6 - 15.2 seconds   INR 2.38 (H) 0.00 - 1.49  APTT  Result Value Ref Range   aPTT 33 24 - 37 seconds  CBC  Result Value Ref Range   WBC 12.8 (H) 4.0 - 10.5 K/uL   RBC 3.20 (L) 4.22 - 5.81 MIL/uL   Hemoglobin 9.8 (L) 13.0 - 17.0 g/dL   HCT 29.2 (L) 39.0 - 52.0 %   MCV 91.3 78.0 - 100.0 fL   MCH 30.6 26.0 - 34.0 pg   MCHC 33.6 30.0 - 36.0 g/dL   RDW 19.4 (H) 11.5 - 15.5 %   Platelets 241  150 - 400 K/uL  Differential  Result Value Ref Range   Neutrophils Relative % 84 (H) 43 - 77 %   Neutro Abs 10.6 (H) 1.7 - 7.7 K/uL   Lymphocytes Relative 8 (L) 12 - 46 %   Lymphs Abs 1.1 0.7 - 4.0 K/uL   Monocytes Relative 8 3 - 12 %   Monocytes Absolute 1.1 (H) 0.1 - 1.0 K/uL   Eosinophils Relative 0 0 - 5 %   Eosinophils Absolute 0.0 0.0 - 0.7 K/uL   Basophils Relative 0 0 - 1 %   Basophils Absolute 0.0 0.0 - 0.1 K/uL  I-Stat Chem 8, ED  Result Value Ref Range   Sodium 137 135 - 145 mmol/L   Potassium 4.1 3.5 - 5.1 mmol/L   Chloride 104 96 - 112 mEq/L   BUN 17 6 - 23 mg/dL   Creatinine, Ser 1.20 0.50 - 1.35 mg/dL   Glucose, Bld 107 (H) 70 - 99 mg/dL   Calcium, Ion 1.18 1.13 - 1.30 mmol/L   TCO2 19 0 - 100 mmol/L   Hemoglobin 10.2 (L) 13.0 - 17.0 g/dL   HCT 30.0 (L) 39.0 - 52.0 %  I-Stat Troponin, ED (not at [facility])  Result Value Ref Range   Troponin i, poc 0.04 0.00 - 0.08 ng/mL   Comment 3           Ct Head Wo Contrast 09/25/2014   CLINICAL DATA:  68 year old with code stroke. Right-sided weakness. History of esophageal cancer.  EXAM: CT HEAD WITHOUT CONTRAST  TECHNIQUE: Contiguous axial images were obtained from the base of the skull through the vertex without contrast.  COMPARISON:  None  FINDINGS: There is focal low density in the posterior left frontal lobe which is consistent with vasogenic edema and there appears to be an underlying lesion in this area. The lesion roughly measures 1.4 cm but poorly characterized on this non contrast examination. Findings are concerning for metastatic disease based on the history of esophagus cancer. There is no significant midline shift. There is no evidence for acute hemorrhage, hydrocephalus or definite infarct.  No sinuses are clear.  No acute bone abnormality.  IMPRESSION: There is a focus of vasogenic edema in the left frontal lobe. Findings are concerning for a brain lesion and likely represent metastatic disease. There is no evidence for  acute hemorrhage, hydrocephalus or midline shift. Recommend further characterization with MRI.  Critical Value/emergent results were called by telephone at the time of interpretation on 09/25/2014  at 12:48 pm to Dr. Leonel Ramsay, who verbally acknowledged these results.   Electronically Signed   By: Markus Daft M.D.   On: 09/25/2014 12:51     1310:  EMS had called code stroke en route. Neuro Dr. Leonel Ramsay at bedside on pt's arrival to the ED. Code stroke cancelled due to pt having focal seizure. IV ativan given. Neuro MD also ordered IV keppra and dilantin loads; he cautioned ED staff to watch for hypotension, requested to admit to medicine service. Pt remains awake/alert, resps without distress, right facial and arm twitching slowly decreasing.  T/C to [facility]. [place] Pink, case discussed, including:  HPI, pertinent PM/SHx, VS/PE, dx testing, ED course and treatment:  Agreeable to admit, requests to write temporary orders, obtain stepdown bed to team [facility].  1445:  ED RN notified me of pt's BP drop to 68/45. IV keppra load already infused, IV dilantin load currently infusing. Pt's right sided muscles twitching significantly improved since arrival to ED.  ED RN instructed to stop IV dilantin, infuse IV NS 572ml bolus, and notify Neuro MD.  1500:  SBP increasing to 80's then 90's after above interventions. ED RN states Neuro MD agrees with above tx and she will transport to stepdown per Neuro MD further instructions.    Francine Graven, DO 09/26/14 1256

## 2014-09-25 NOTE — H&P (Signed)
History and Physical  [name] [name] OXB:[id number] DOB: 09/04/1946 DOA: 09/25/2014  Referring physician: Lurene Shadow, ER physician PCP: [name], MD   Chief Complaint: Seizure  HPI: [name] [name] is a 68 y.o. male  With past mental history of esophageal cancer status post previous radiation treatments and ongoing chemotherapy who presented to the emergency room and was just discharged from the hospitalist service 6 days ago for diastolic heart failure and frequent falls and was brought in today by EMS from his skilled nursing facility with some right arm facility and twitching. There is concerns about seizure activity and while being transported, patient had a witnessed generalized tonic-clonic seizure lasting 3-4 minutes. He was given a dose of IV Ativan and brought into the ER. In the emergency room, he continued to have some right facial and right upper extremity twitching concerning for ongoing focal seizure. Patient was given IV Keppra and Dilantin. A CT scan of the head was concerning for a isolated brain lesion felt to be metastatic disease from patient's esophageal cancer. No evidence of acute hemorrhage, hydrocephalus or midline shift. Dr. Leonel Ramsay from neurology was consulted and recommended MRI for better evaluation of lesion, holding IV Dilantin and continuing IV Keppra and monitoring patient's stepdown unit. Hospitalists were called for further evaluation and admission.   Review of Systems:  Patient seen after transfer to unit. Unable to get review of systems due to somnolence from Ativan  Review of systems are otherwise negative  Past Medical History  Diagnosis Date  . GERD (gastroesophageal reflux disease)   . Hyperlipemia   . Barrett's esophagus 12/2013  . History of radiation therapy 02/23/14- 04/04/14    esophagus 5040 cGy 28 sessions  . 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 Hypertension    Past Surgical History  Procedure Laterality Date  . Eus  N/A 02/03/2014    Procedure: UPPER ENDOSCOPIC ULTRASOUND (EUS) LINEAR;  Surgeon: Milus Banister, MD;  Location: [facility];  Service: Endoscopy;  Laterality: N/A;   Social History:  reports that he quit smoking about 26 years ago. His smoking use included Cigarettes. He has a 70 pack-year smoking history. He has never used smokeless tobacco. He reports that he drinks alcohol. He reports that he does not use illicit drugs. Unable to get his home situation and her activity level due to somnolence  No Known Allergies  Family History  Problem Relation Age of Onset  . Breast cancer Sister     Obtained from previous records  Prior to Admission medications   Medication Sig Start Date End Date Taking? Authorizing Provider  tiotropium (SPIRIVA) 18 MCG inhalation capsule Place 1 capsule (18 mcg total) into inhaler and inhale daily. 08/18/14  Yes Brand Males, MD  Rivaroxaban (XARELTO) 15 MG TABS tablet Take 1 tablet (15 mg total) by mouth 2 (two) times daily with a meal. 09/19/14 09/22/14  Cristal Ford, DO    Physical Exam: BP 87/50 mmHg  Pulse 64  Temp(Src) 97.5 F (36.4 C) (Axillary)  Resp 8  Wt 86.1 kg (189 lb 13.1 oz)  SpO2 100%  General:  Drowsy, no acute distress Eyes: Sclerae appear nonicteric ENT: Normocephalic major manic, mucous members are dry Neck: No JVD Cardiovascular: Irregular rhythm, rate controlled Respiratory: Clear to auscultation bilaterally Abdomen: Soft, nontender, nondistended, positive bowel sounds Skin: No skin breaks, tears or lesions Musculoskeletal: No clubbing or cyanosis or edema Psychiatric: Patient currently somnolent Neurologic: Unable to check full exam secondary to somnolence  Labs on Admission:  Basic Metabolic Panel:  Recent Labs Lab 09/25/14 1221 09/25/14 1227  NA 135 137  K 4.2 4.1  CL 108 104  CO2 18*  --   GLUCOSE 108* 107*  BUN 15 17  CREATININE 1.21 1.20  CALCIUM 8.7  --    Liver Function Tests:  Recent  Labs Lab 09/25/14 1221  AST 22  ALT 15  ALKPHOS 80  BILITOT 0.5  PROT 5.2*  ALBUMIN 2.5*   No results for input(s): LIPASE, AMYLASE in the last 168 hours. No results for input(s): AMMONIA in the last 168 hours. CBC:  Recent Labs Lab 09/25/14 1221 09/25/14 1227  WBC 12.8*  --   NEUTROABS 10.6*  --   HGB 9.8* 10.2*  HCT 29.2* 30.0*  MCV 91.3  --   PLT 241  --    Cardiac Enzymes: No results for input(s): CKTOTAL, CKMB, CKMBINDEX, TROPONINI in the last 168 hours.  BNP (last 3 results)  Recent Labs  09/15/14 1320 09/17/14 0455  PROBNP 2151.0* 642.5*   CBG:  Recent Labs Lab 09/19/14 0741  GLUCAP 104*    Radiological Exams on Admission: Ct Head Wo Contrast  09/25/2014   CLINICAL DATA:  68 year old with code stroke. Right-sided weakness. History of esophageal cancer.  EXAM: CT HEAD WITHOUT CONTRAST  TECHNIQUE: Contiguous axial images were obtained from the base of the skull through the vertex without contrast.  COMPARISON:  None  FINDINGS: There is focal low density in the posterior left frontal lobe which is consistent with vasogenic edema and there appears to be an underlying lesion in this area. The lesion roughly measures 1.4 cm but poorly characterized on this non contrast examination. Findings are concerning for metastatic disease based on the history of esophagus cancer. There is no significant midline shift. There is no evidence for acute hemorrhage, hydrocephalus or definite infarct.  No sinuses are clear.  No acute bone abnormality.  IMPRESSION: There is a focus of vasogenic edema in the left frontal lobe. Findings are concerning for a brain lesion and likely represent metastatic disease. There is no evidence for acute hemorrhage, hydrocephalus or midline shift. Recommend further characterization with MRI.  Critical Value/emergent results were called by telephone at the time of interpretation on 09/25/2014 at 12:48 pm to Dr. Leonel Ramsay, who verbally acknowledged  these results.   Electronically Signed   By: Markus Daft M.D.   On: 09/25/2014 12:51   Dg Chest Port 1 View  09/25/2014   CLINICAL DATA:  Seizures.  EXAM: PORTABLE CHEST - 1 VIEW  COMPARISON:  09/14/2014  FINDINGS: Right IJ Port-A-Cath is unchanged with tip in the region of the cavoatrial junction. Lungs are adequately inflated with stable focal opacification over the right midlung which may represent fluid within the fissure. Hazy left base opacification as cannot exclude small effusion with atelectasis versus infection. Mild stable cardiomegaly. Remainder the exam is unchanged.  IMPRESSION: Left base opacification which may represent a small effusion with atelectasis although cannot exclude infection. Stable focal opacification over the right mid lung likely fluid within the fissure.  Stable cardiomegaly.   Electronically Signed   By: Marin Olp M.D.   On: 09/25/2014 13:59     Assessment/Plan Present on Admission:  . Hypertension: Blood pressure on soft side given Ativan. Hold oral antihypertensives  . Hyperlipemia . 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 Previously thought to be isolated. We'll plan to get MRI to better  look at lesion ensure there are no other lesions. Then discussed with neurosurgery. Following that, if there are no options for neurosurgical standpoint, we'll discuss with oncology and radiation oncology and likely transfer to [facility] long. In the meantime has started IV steroids to decrease inflammation. Neurology following. On IV antiepileptics and when necessary Ativan. . DVT of lower extremity (deep venous thrombosis): Patient on xarelto. Have held this and started IV heparin in case there may be opportunity for any invasive option, plus patient currently too drowsy take anything orally  . BPH (benign prostatic hypertrophy): Holding patient's oral medications while he is still drowsy  . A-fib:  Rate controlled. On anticoagulation, currently held while patient is drowsy  . Severe protein-calorie malnutrition: Have asked nutrition to see  Consultants: Neurology  Code Status: Full code for now, although greatly concerned about this patient ending up on a ventilator  Family Communication: Spoke with patient's niece, patient does not have a formal medical power of attorney, however she is his closest relative   Disposition Plan: After MRI, we'll discuss options with neurosurgery and oncology/radiation oncology. Patient likely will end up being transferred to [facility] long  Time spent: 63 minutes  [facility] Pager [phone]

## 2014-09-25 NOTE — ED Notes (Signed)
Pt was stating 91% on RA this tech placed pt on NRB at 10 L and pt O2 stat went to 98%.

## 2014-09-25 NOTE — ED Notes (Signed)
Per Dr. Leonel Ramsay, cancel code stroke. No need for NIH. Continue with seizure work-up and treatment.

## 2014-09-25 NOTE — ED Notes (Signed)
Transferring to step down at this time.

## 2014-09-25 NOTE — ED Notes (Signed)
Wyvonne Lenz, RN on Stepdown of changes in condition and orders.

## 2014-09-26 ENCOUNTER — Ambulatory Visit: Payer: Medicare Other | Admitting: Physician Assistant

## 2014-09-26 ENCOUNTER — Other Ambulatory Visit: Payer: Self-pay | Admitting: Physician Assistant

## 2014-09-26 ENCOUNTER — Other Ambulatory Visit: Payer: Medicare Other

## 2014-09-26 LAB — RAPID URINE DRUG SCREEN, HOSP PERFORMED
AMPHETAMINES: NOT DETECTED
BARBITURATES: NOT DETECTED
Benzodiazepines: POSITIVE — AB
COCAINE: NOT DETECTED
Opiates: NOT DETECTED
Tetrahydrocannabinol: NOT DETECTED

## 2014-09-26 LAB — CBC
HEMATOCRIT: 28.3 % — AB (ref 39.0–52.0)
HEMOGLOBIN: 9.2 g/dL — AB (ref 13.0–17.0)
MCH: 30.2 pg (ref 26.0–34.0)
MCHC: 32.5 g/dL (ref 30.0–36.0)
MCV: 92.8 fL (ref 78.0–100.0)
Platelets: 273 10*3/uL (ref 150–400)
RBC: 3.05 MIL/uL — ABNORMAL LOW (ref 4.22–5.81)
RDW: 19.7 % — AB (ref 11.5–15.5)
WBC: 11.2 10*3/uL — ABNORMAL HIGH (ref 4.0–10.5)

## 2014-09-26 LAB — APTT: aPTT: 156 seconds — ABNORMAL HIGH (ref 24–37)

## 2014-09-26 LAB — HEPARIN LEVEL (UNFRACTIONATED)

## 2014-09-26 LAB — BASIC METABOLIC PANEL
Anion gap: 5 (ref 5–15)
BUN: 15 mg/dL (ref 6–23)
CALCIUM: 8.3 mg/dL — AB (ref 8.4–10.5)
CO2: 24 mmol/L (ref 19–32)
Chloride: 105 mEq/L (ref 96–112)
Creatinine, Ser: 1.17 mg/dL (ref 0.50–1.35)
GFR calc non Af Amer: 62 mL/min — ABNORMAL LOW (ref >90)
GFR, EST AFRICAN AMERICAN: 72 mL/min — AB (ref >90)
GLUCOSE: 117 mg/dL — AB (ref 70–99)
POTASSIUM: 5 mmol/L (ref 3.5–5.1)
SODIUM: 134 mmol/L — AB (ref 135–145)

## 2014-09-26 MED ORDER — SODIUM CHLORIDE 0.9 % IV SOLN
200.0000 mg | INTRAVENOUS | Status: AC
  Administered 2014-09-26: 200 mg via INTRAVENOUS
  Filled 2014-09-26 (×2): qty 20

## 2014-09-26 MED ORDER — ENOXAPARIN SODIUM 100 MG/ML ~~LOC~~ SOLN
85.0000 mg | Freq: Two times a day (BID) | SUBCUTANEOUS | Status: AC
  Administered 2014-09-26 – 2014-10-04 (×17): 85 mg via SUBCUTANEOUS
  Filled 2014-09-26 (×20): qty 1

## 2014-09-26 MED ORDER — LIDOCAINE-PRILOCAINE 2.5-2.5 % EX CREA
TOPICAL_CREAM | Freq: Once | CUTANEOUS | Status: DC
  Filled 2014-09-26: qty 5

## 2014-09-26 MED ORDER — LEVETIRACETAM IN NACL 1500 MG/100ML IV SOLN
1500.0000 mg | Freq: Two times a day (BID) | INTRAVENOUS | Status: DC
  Administered 2014-09-26 – 2014-10-02 (×13): 1500 mg via INTRAVENOUS
  Filled 2014-09-26 (×18): qty 100

## 2014-09-26 MED ORDER — PANTOPRAZOLE SODIUM 40 MG IV SOLR
40.0000 mg | INTRAVENOUS | Status: DC
  Administered 2014-09-26 – 2014-09-27 (×2): 40 mg via INTRAVENOUS
  Filled 2014-09-26 (×4): qty 40

## 2014-09-26 MED ORDER — HEPARIN (PORCINE) IN NACL 100-0.45 UNIT/ML-% IJ SOLN
1150.0000 [IU]/h | INTRAMUSCULAR | Status: DC
  Administered 2014-09-26: 1150 [IU]/h via INTRAVENOUS
  Filled 2014-09-26 (×2): qty 250

## 2014-09-26 MED ORDER — SODIUM CHLORIDE 0.9 % IV SOLN
100.0000 mg | Freq: Two times a day (BID) | INTRAVENOUS | Status: DC
  Administered 2014-09-26 – 2014-10-02 (×12): 100 mg via INTRAVENOUS
  Filled 2014-09-26 (×24): qty 10

## 2014-09-26 MED ORDER — LORAZEPAM 2 MG/ML IJ SOLN
0.5000 mg | INTRAMUSCULAR | Status: DC | PRN
  Administered 2014-09-26: 0.5 mg via INTRAVENOUS
  Filled 2014-09-26 (×2): qty 1

## 2014-09-26 NOTE — Progress Notes (Signed)
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 Currently on chemotherapy followed by [name]. Recently diagnosed with DVT on 09/02/14 and started initially on Lovenox w/ transition to Xarelto. Recent admission to the hospital for diastolic dysfunction and was discharged on 12/21. At skilled nursing facility noted to have focal seizure activity involving right arm. Transported via EMS. En route noted to have generalized tonic-clonic seizure that responded to IV Ativan. Upon arrival to ER continued right facial and upper extremity twitching. Given IV Keppra and Dilantin. CT scan the head concerning for brain lesion likely metastatic but no evidence of hemorrhage, hydrocephalus, or midline shift. Neurology evaluated the patient. MRI ordered.  Since admission MRI confirmed left frontal region mass measuring 1.3 x 1.6 x 2 cm. Started on IV Decadron. Neurosurgery and Oncology consulted. Because of metastatic brain lesion anticoagulation held.  HPI/Subjective: Upon entering room patient having focal seizure activity involving right face and upper extremity. Patient attempted to turn head to face me when I spoke to him but unable to verbally respond except for grunting. Seizure lasted several minutes with noted postictal phase.  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 -Appreciate Neurology assistance - continue IV Keppra; Vimpat added 12/28 due to ongoing  seizure activity - limited use of Ativan in setting of low blood pressure/hold parameters of systolic blood pressure less than or equal to 95   Acute respiratory failure with hypoxia -Directly related to altered mentation and recurrent seizures - continue supportive care with oxygen - monitor for signs of aspiration - nothing by mouth until seizure free    Hypertension -Blood pressure soft - metoprolol, valsartan and hydrochlorothiazide on hold    Hyperlipemia    DVT of lower extremity  -New diagnosis 12/4 - anticoagulation to be resumed w/ lovenox - recent study supports it's safe use in this clinical scenario (Intracranial hemorrhage in patients with brain metastases treated with therapeutic enoxaparin: a matched cohort study - Lenna Sciara, Federico Campigotto, Hood Smiley Houseman Anderson, Loman Chroman M. Weber, Romona Curls Blood Jan 2015, DOI: [id number])    BPH     A-fib -Rate controlled    Severe protein-calorie malnutrition -On Megace prior to admission  DVT prophylaxis: lovenox  Code Status: Full Family Communication: Family at bedside updated Disposition Plan/Expected LOS: Stepdown  Consultants: Neurology/[name] Neurosurgery/Dr. Pool Oncology/[name]  Procedures: None  Antibiotics: None  Objective: Blood pressure 92/56, pulse 71, temperature 98 F (36.7 C), temperature source Axillary, resp. rate 14, weight 189 lb 13.1 oz (86.1 kg), SpO2 100 %.  Intake/Output Summary (Last 24 hours) at 09/26/14 1223 Last data filed at 09/26/14 1100  Gross per 24 hour  Intake 2101.88 ml  Output    675 ml  Net 1426.88 ml    Exam: Gen: No acute respiratory distress although noted with tachypnea and labored respiratory pattern during seizure activity Neurological: Observed focal seizure activity involving right face and right upper extremity with postictal phase upon resolution of seizure  activity Chest: Clear to auscultation  bilaterally without wheezes or crackles, 2 L Cardiac: Regular rate and rhythm, S1-S2, no rubs murmurs or gallops Abdomen: Soft nontender nondistended without obvious hepatosplenomegaly, no ascites Extremities: no signif c/c/e B le   Scheduled Meds:  Scheduled Meds: . dexamethasone  4 mg Intravenous Q6H  . lacosamide (VIMPAT) IV  100 mg Intravenous Q12H  . levETIRAcetam  1,500 mg Intravenous Q12H  . lidocaine-prilocaine   Topical Once  . tiotropium  18 mcg Inhalation Daily    Data Reviewed: Basic Metabolic Panel:  Recent Labs Lab 09/25/14 1221 09/25/14 1227 09/26/14 0455  NA 135 137 134*  K 4.2 4.1 5.0  CL 108 104 105  CO2 18*  --  24  GLUCOSE 108* 107* 117*  BUN 15 17 15   CREATININE 1.21 1.20 1.17  CALCIUM 8.7  --  8.3*   Liver Function Tests:  Recent Labs Lab 09/25/14 1221  AST 22  ALT 15  ALKPHOS 80  BILITOT 0.5  PROT 5.2*  ALBUMIN 2.5*   CBC:  Recent Labs Lab 09/25/14 1221 09/25/14 1227 09/26/14 0455  WBC 12.8*  --  11.2*  NEUTROABS 10.6*  --   --   HGB 9.8* 10.2* 9.2*  HCT 29.2* 30.0* 28.3*  MCV 91.3  --  92.8  PLT 241  --  273   Cardiac Enzymes:  Recent Labs Lab 09/25/14 2011  CKTOTAL 91   BNP (last 3 results)  Recent Labs  09/15/14 1320 09/17/14 0455  PROBNP 2151.0* 642.5*    Recent Results (from the past 240 hour(s))  MRSA PCR Screening     Status: None   Collection Time: 09/25/14  4:49 PM  Result Value Ref Range Status   MRSA by PCR NEGATIVE NEGATIVE Final    Comment:        The GeneXpert MRSA Assay (FDA approved for NASAL specimens only), is one component of a comprehensive MRSA colonization surveillance program. It is not intended to diagnose MRSA infection nor to guide or monitor treatment for MRSA infections.      Studies:  Recent x-ray studies have been reviewed in detail by the Attending Physician  Time spent :  [place], ANP [facility] Office  [phone] Pager  [phone]  On-Call/Text Page:      [email or web address]      password [email or web address]  If 7PM-7AM, please contact night-coverage [email or web address] Password [facility] 09/26/2014, 12:23 PM   LOS: 1 day   I have personally examined this patient and reviewed the entire database. I have reviewed the above note, made any necessary editorial changes, and agree with its content.  Cherene Altes, MD [facility]

## 2014-09-26 NOTE — Progress Notes (Signed)
INITIAL NUTRITION ASSESSMENT  DOCUMENTATION CODES Per approved criteria  -Not Applicable   INTERVENTION: Diet advancement per MD/SLP Add Ensure Complete BID after meals when diet is advanced, each supplement provides 350 kcal and 13 grams of protein Add Pro-stat BID when diet advanced Recommend Magic Mouth wash  NUTRITION DIAGNOSIS: Inadequate oral intake related to varied appetite as evidenced by pt's report and 5% weight loss in one month.   Goal: Pt to meet >/= 90% of their estimated nutrition needs   Monitor:  Diet advancement, PO intake, weight trend, labs  Reason for Assessment: Consult (Protein Calorie Malnutrition)  68 y.o. male  Admitting Dx: Malignant neoplasm metastatic to brain  ASSESSMENT: 68 y.o. male with past mental history of esophageal cancer status post previous radiation treatments and ongoing chemotherapy who presented to the emergency room and was just discharged from the hospitalist service 6 days ago for diastolic heart failure and frequent falls and was brought in today by EMS from his skilled nursing facility with some right arm facility and twitching. There is concerns about seizure activity and while being transported, patient had a witnessed generalized tonic-clonic seizure lasting 3-4 minutes.   Patient reports that he was eating well PTA but, that his appetite varies. When he can't eat much he drinks Ensure shakes 2-3 times per day. He reports eating eggs and grits most days for breakfast but, he usually only eats 2 meals. He reports taste changes PTA and oral ulcers- Magic Mouth was helps. Weight history shows pt has lost from 202 lbs to 186 lbs within the past 6 weeks.  Labs: low protein, low albumin, low hemoglobin  Nutrition Focused Physical Exam:  Subcutaneous Fat:  Orbital Region: wnl Upper Arm Region: wnl Thoracic and Lumbar Region: NA  Muscle:  Temple Region: wnl Clavicle Bone Region: mild wasting Clavicle and Acromion Bone Region:  wnl Scapular Bone Region: NA Dorsal Hand: wnl Patellar Region: mild wasting Anterior Thigh Region: moderate wasting Posterior Calf Region: wnl  Edema: none noted   Height: Ht Readings from Last 1 Encounters:  09/15/14 5\' 7"  (1.702 m)    Weight: Wt Readings from Last 1 Encounters:  09/25/14 189 lb 13.1 oz (86.1 kg)    Ideal Body Weight: 148 lbs  % Ideal Body Weight: 128%  Wt Readings from Last 10 Encounters:  09/25/14 189 lb 13.1 oz (86.1 kg)  09/19/14 186 lb 11.7 oz (84.7 kg)  09/09/14 200 lb 8 oz (90.946 kg)  09/02/14 195 lb 11.2 oz (88.769 kg)  08/18/14 208 lb (94.348 kg)  08/15/14 202 lb 6.4 oz (91.808 kg)  07/22/14 202 lb 12.8 oz (91.989 kg)  05/30/14 200 lb 9.6 oz (90.992 kg)  05/18/14 203 lb 8 oz (92.307 kg)  05/17/14 202 lb (91.627 kg)    Usual Body Weight: 200 lbs  % Usual Body Weight: 95%  BMI:  Body mass index is 29.72 kg/(m^2).  Estimated Nutritional Needs: Kcal: 2200-2400 Protein: 105-120 grams Fluid: 2.4 L/day  Skin: Stage 2 pressure ulcer on sacrum  Diet Order: Diet NPO time specified  EDUCATION NEEDS: -No education needs identified at this time   Intake/Output Summary (Last 24 hours) at 09/26/14 1055 Last data filed at 09/26/14 0800  Gross per 24 hour  Intake 1801.88 ml  Output    675 ml  Net 1126.88 ml    Last BM: PTA  Labs:   Recent Labs Lab 09/25/14 1221 09/25/14 1227 09/26/14 0455  NA 135 137 134*  K 4.2 4.1 5.0  CL  108 104 105  CO2 18*  --  24  BUN 15 17 15   CREATININE 1.21 1.20 1.17  CALCIUM 8.7  --  8.3*  GLUCOSE 108* 107* 117*    CBG (last 3)  No results for input(s): GLUCAP in the last 72 hours.  Scheduled Meds: . dexamethasone  4 mg Intravenous Q6H  . lacosamide (VIMPAT) IV  100 mg Intravenous Q12H  . levETIRAcetam  1,500 mg Intravenous Q12H  . tiotropium  18 mcg Inhalation Daily    Continuous Infusions: . sodium chloride 150 mL/hr (09/26/14 7062)    Past Medical History  Diagnosis Date  .  GERD (gastroesophageal reflux disease)   . Hyperlipemia   . Barrett's esophagus 12/2013  . History of radiation therapy 02/23/14- 04/04/14    esophagus 5040 cGy 28 sessions  . 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 Hypertension     Past Surgical History  Procedure Laterality Date  . Eus N/A 02/03/2014    Procedure: UPPER ENDOSCOPIC ULTRASOUND (EUS) LINEAR;  Surgeon: Milus Banister, MD;  Location: [facility];  Service: Endoscopy;  Laterality: N/A;    Pryor Ochoa RD, LDN Inpatient Clinical Dietitian Pager: [phone] After Hours Pager: [phone]

## 2014-09-26 NOTE — Progress Notes (Signed)
Utilization review completed. Diquan Kassis, RN, BSN. 

## 2014-09-26 NOTE — Progress Notes (Signed)
MD at bedside patient started seizing, twiching noted on the right side of the face and right arm MD ordered Vimpat Stat.

## 2014-09-26 NOTE — Progress Notes (Signed)
ANTICOAGULATION CONSULT NOTE - Follow Up Consult  Pharmacy Consult for Heparin (whilte Xarelto on hold) Indication: hx DVT  No Known Allergies  Patient Measurements: Weight: 189 lb 13.1 oz (86.1 kg)  Vital Signs: Temp: 97.9 F (36.6 C) (12/28 0412) Temp Source: Axillary (12/28 0412) BP: 99/63 mmHg (12/28 0500) Pulse Rate: 66 (12/28 0500)  Labs:  Recent Labs  09/25/14 1221 09/25/14 1227 09/25/14 2011 09/26/14 0455  HGB 9.8* 10.2*  --  9.2*  HCT 29.2* 30.0*  --  28.3*  PLT 241  --   --  273  APTT 33  --   --  156*  LABPROT 26.2*  --   --   --   INR 2.38*  --   --   --   CREATININE 1.21 1.20  --  1.17  CKTOTAL  --   --  91  --     Estimated Creatinine Clearance: 63.3 mL/min (by C-G formula based on Cr of 1.17).   Assessment: Heparin for hx DVT while Xarelto on hold. Using aPTT to dose for now. APTT is elevated at 156. No issues per RN.   Goal of Therapy:  Heparin level 0.3-0.7 units/ml aPTT 66-102 seconds Monitor platelets by anticoagulation protocol: Yes   Plan:  -Hold heparin x 1 hour -Restart heparin at 1150 units/hr at 0730 -1500 aPTT/HL -Daily aPTT/HL/CBC -Monitor for bleeding  Narda Bonds 09/26/2014,6:33 AM

## 2014-09-26 NOTE — Progress Notes (Addendum)
NEURO HOSPITALIST PROGRESS NOTE   SUBJECTIVE:                                                                                                                        Patient is awake and alert but at this moment having a right focal motor seizures involving face and arm. Nursing staff indicated that he had a seizure last night after returning from MRI. On keppra 1000 mg twice a day. MRI brain 1.3 x 1.6 x 2.0 cm heterogeneously enhancing mass within the highposterior left frontal region, most concerning for metastatic disease. Started on decadron.  OBJECTIVE:                                                                                                                           Vital signs in last 24 hours: Temp:  [97.3 F (36.3 C)-98.2 F (36.8 C)] 98 F (36.7 C) (12/28 0745) Pulse Rate:  [55-99] 64 (12/28 0745) Resp:  [8-22] 15 (12/28 0745) BP: (69-149)/(48-113) 85/55 mmHg (12/28 0745) SpO2:  [98 %-100 %] 100 % (12/28 0745) FiO2 (%):  [2 %] 2 % (12/28 0745) Weight:  [86.1 kg (189 lb 13.1 oz)] 86.1 kg (189 lb 13.1 oz) (12/27 1535)  Intake/Output from previous day: 12/27 0701 - 12/28 0700 In: 1651.9 [I.V.:1051.9; IV Piggyback:100] Out: 275 [Urine:275] Intake/Output this shift: Total I/O In: -  Out: 400 [Urine:400] Nutritional status: Diet NPO time specified  Past Medical History  Diagnosis Date  . GERD (gastroesophageal reflux disease)   . Hyperlipemia   . Barrett's esophagus 12/2013  . History of radiation therapy 02/23/14- 04/04/14    esophagus 5040 cGy 28 sessions  . 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 Hypertension    Physical exam: pleasant male in no apparent distress. Head: normocephalic. Neck: supple, no bruits, no JVD. Cardiac: no murmurs. Lungs: clear. Abdomen: soft, no tender, no mass. Extremities: bilateral LE edema.  Neurologic Exam:  Mental Status: Patient is awake, alert, oriented to person, place, month,  year, and situation. No clear signs of aphasia, however it is somewhat difficult to assess given that he has repeated twitching of his right face and therefore is difficult to understand at times. No signs of neglect Cranial  Nerves: II: Visual Fields are full. Pupils are equal, round, and reactive to light.  III,IV, VI: EOMI without ptosis or diploplia.  V: Facial sensation is symmetric to temperature VII: Facial movement is notable for right facial twitching and weakness VIII: hearing is intact to voice X: Uvula elevates symmetrically XI: Shoulder shrug is symmetric. XII: tongue is midline without atrophy or fasciculations.  Motor: Tone is normal. Bulk is normal. 5/5 strength was present on the left, on the right he has significant weakness and twitching of his right arm, some voluntary movement remaining in his right leg with less twitching. Sensory: Sensation is decreased on the right side Deep Tendon Reflexes: 2+ and symmetric in the biceps and patellae.   Lab Results: No results found for: CHOL Lipid Panel No results for input(s): CHOL, TRIG, HDL, CHOLHDL, VLDL, LDLCALC in the last 72 hours.  Studies/Results: Ct Head Wo Contrast  09/25/2014   CLINICAL DATA:  68 year old with code stroke. Right-sided weakness. History of esophageal cancer.  EXAM: CT HEAD WITHOUT CONTRAST  TECHNIQUE: Contiguous axial images were obtained from the base of the skull through the vertex without contrast.  COMPARISON:  None  FINDINGS: There is focal low density in the posterior left frontal lobe which is consistent with vasogenic edema and there appears to be an underlying lesion in this area. The lesion roughly measures 1.4 cm but poorly characterized on this non contrast examination. Findings are concerning for metastatic disease based on the history of esophagus cancer. There is no significant midline shift. There is no evidence for acute hemorrhage, hydrocephalus or definite infarct.  No sinuses are  clear.  No acute bone abnormality.  IMPRESSION: There is a focus of vasogenic edema in the left frontal lobe. Findings are concerning for a brain lesion and likely represent metastatic disease. There is no evidence for acute hemorrhage, hydrocephalus or midline shift. Recommend further characterization with MRI.  Critical Value/emergent results were called by telephone at the time of interpretation on 09/25/2014 at 12:48 pm to Dr. Leonel Ramsay, who verbally acknowledged these results.   Electronically Signed   By: Markus Daft M.D.   On: 09/25/2014 12:51   Mr Jeri Cos CV Contrast  09/26/2014   CLINICAL DATA:  New onset seizure. History of esophageal cancer. Initial encounter.  EXAM: MRI HEAD WITHOUT AND WITH CONTRAST  TECHNIQUE: Multiplanar, multiecho pulse sequences of the brain and surrounding structures were obtained without and with intravenous contrast.  CONTRAST:  35mL MULTIHANCE GADOBENATE DIMEGLUMINE 529 MG/ML IV SOLN  COMPARISON:  Prior CT from earlier the same day.  FINDINGS: There is an irregular enhancing mass that measures 1.3 x 1.6 x 2.0 cm within the high left posterior left frontal region (series 14, image 19). Heterogeneous T2 signal intensity with restricted diffusion intensity within this lesion suggests central necrosis. There There is associated vasogenic edema within the left frontoparietal region. Finding concerning for possible metastatic disease. No other lesions identified within the brain. No other abnormal enhancement.  Mild T2/FLAIR hyperintensity within the periventricular and deep white matter both cerebral hemispheres noted, nonspecific, but likely related to mild chronic small vessel ischemic disease.  No abnormal foci of restricted diffusion to suggest acute intracranial infarct identified. Gray-white matter differentiation otherwise maintained. Normal intravascular flow voids present.  There is no midline shift. No hydrocephalus. No extra-axial fluid collection.  Craniocervical  junction is within normal limits. Scattered degenerative changes present within the visualized upper cervical spine. There appears to be associated a least moderate to severe central canal  stenosis at the C3-4 level, incompletely evaluated on this exam (series 2, image 13).  Pituitary gland within normal limits.  No acute abnormality seen about the orbits.  Paranasal sinuses and mastoid air cells are clear.  No acute abnormalities cm within the scalp soft tissues.  IMPRESSION: 1. 1.3 x 1.6 x 2.0 cm heterogeneously enhancing mass within the high posterior left frontal region, most concerning for metastatic disease given the history of esophageal carcinoma. No other intracranial lesions or abnormal enhancement identified. 2. No other acute intracranial process. 3. Degenerative changes within the upper cervical spine with associated severe canal stenosis at C3-4, incompletely evaluated on this exam. Further evaluation with MRI the cervical spine could be performed as clinically desired.   Electronically Signed   By: Jeannine Boga M.D.   On: 09/26/2014 00:44   Dg Chest Port 1 View  09/25/2014   CLINICAL DATA:  Seizures.  EXAM: PORTABLE CHEST - 1 VIEW  COMPARISON:  09/14/2014  FINDINGS: Right IJ Port-A-Cath is unchanged with tip in the region of the cavoatrial junction. Lungs are adequately inflated with stable focal opacification over the right midlung which may represent fluid within the fissure. Hazy left base opacification as cannot exclude small effusion with atelectasis versus infection. Mild stable cardiomegaly. Remainder the exam is unchanged.  IMPRESSION: Left base opacification which may represent a small effusion with atelectasis although cannot exclude infection. Stable focal opacification over the right mid lung likely fluid within the fissure.  Stable cardiomegaly.   Electronically Signed   By: Marin Olp M.D.   On: 09/25/2014 13:59    MEDICATIONS                                                                                                                         Scheduled: . dexamethasone  4 mg Intravenous Q6H  . lacosamide (VIMPAT) IV  100 mg Intravenous Q12H  . lacosamide (VIMPAT) IV  200 mg Intravenous STAT  . levETIRAcetam  1,000 mg Intravenous Q12H  . 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 Will load with 1 gram IV vimpat and continue 100 mg BID starting tomorrow. Increase keppra to 1.5 gr BID. SBP in the low 80's thus will try to avoid benzodiazepines if possible. Continue decadron. Will follow up.  Dorian Pod, MD [facility] [phone]  09/26/2014, 7:58 AM

## 2014-09-26 NOTE — Progress Notes (Signed)
Pt just had seizures witnessed by family facial and right arm twitching, PRN ativan given with BP of 100/70,no post ictal noted.

## 2014-09-26 NOTE — Progress Notes (Signed)
ANTICOAGULATION CONSULT NOTE - Follow Up Consult  Pharmacy Consult for Heparin (whilte Xarelto on hold)> transition to enoxaparin. Indication: hx DVT  No Known Allergies  Patient Measurements: Height: 5\' 5"  (165.1 cm) Weight: 189 lb 13.1 oz (86.1 kg) IBW/kg (Calculated) : 61.5  Vital Signs: Temp: 98.3 F (36.8 C) (12/28 1710) Temp Source: Axillary (12/28 1710) BP: 100/66 mmHg (12/28 1710) Pulse Rate: 73 (12/28 1710)  Labs:  Recent Labs  09/25/14 1221 09/25/14 1227 09/25/14 2011 09/26/14 0415 09/26/14 0455  HGB 9.8* 10.2*  --   --  9.2*  HCT 29.2* 30.0*  --   --  28.3*  PLT 241  --   --   --  273  APTT 33  --   --   --  156*  LABPROT 26.2*  --   --   --   --   INR 2.38*  --   --   --   --   HEPARINUNFRC  --   --   --  >2.20*  --   CREATININE 1.21 1.20  --   --  1.17  CKTOTAL  --   --  91  --   --     Estimated Creatinine Clearance: 60.9 mL/min (by C-G formula based on Cr of 1.17).   Assessment: 68 yo M admitted 09/25/2014 with siezure.  Pharmacy consulted to dose anticoagulation for recent DVT, now transitioning to enoxaparin in setting of brain metastasis with ongoing neurosurgery evaluation.  Goal of Therapy:  4 H heparin level 0.6-1.0 CBC q72h Monitor platelets by anticoagulation protocol: Yes   Plan:  -Enoxaparin 85 mg SQ q12h -Follow CBC and SCr, adjust as indicated. -Monitor for bleeding  Thank you for allowing pharmacy to be a part of this patients care team.  Rowe Robert Pharm.D., BCPS, AQ-Cardiology Clinical Pharmacist 09/26/2014 6:15 PM Pager: [phone] Phone: [phone]

## 2014-09-26 NOTE — Progress Notes (Signed)
SLP Cancellation Note  Patient Details Name: [name] [name] MRN: [id number] DOB: 10-20-1945   Cancelled treatment:       Reason Eval/Treat Not Completed: Medical issues which prohibited therapy. Pt about to receive dose of ativan; has had multiple seizures this am. Will return to complete assessment.    Teneshia Hedeen, Katherene Ponto 09/26/2014, 10:37 AM

## 2014-09-26 NOTE — Consult Note (Signed)
Reason for Consult: Brain metastasis Referring Physician: Dr. Genene Churn Cohen is an 68 y.o. male.  HPI: 68 year old male with known grade IIIB esophageal carcinoma presents with new onset seizures. Seizures have been protracted, simple partial seizures involving his right face right upper extremity and minimally his right lower extremity. Seizures have not generalized. Seizures originally did not respond Keppra and now valproic acid has been added. Patient recently had another episode of seizure and now is post ictal and/or medicated. He will arouse minimally and answer some simple questions but I cannot obtain a detailed history from him and his current state.  Past Medical History  Diagnosis Date  . GERD (gastroesophageal reflux disease)   . Hyperlipemia   . Barrett's esophagus 12/2013  . History of radiation therapy 02/23/14- 04/04/14    esophagus 5040 cGy 28 sessions  . 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 Hypertension     Past Surgical History  Procedure Laterality Date  . Eus N/A 02/03/2014    Procedure: UPPER ENDOSCOPIC ULTRASOUND (EUS) LINEAR;  Surgeon: Milus Banister, MD;  Location: [facility];  Service: Endoscopy;  Laterality: N/A;    Family History  Problem Relation Age of Onset  . Breast cancer Sister     Social History:  reports that he quit smoking about 26 years ago. His smoking use included Cigarettes. He has a 70 pack-year smoking history. He has never used smokeless tobacco. He reports that he drinks alcohol. He reports that he does not use illicit drugs.  Allergies: No Known Allergies  Medications: I have reviewed the patient's current medications.  Results for orders placed or performed during the hospital encounter of 09/25/14 (from the past 48 hour(s))  Ethanol     Status: None   Collection Time: 09/25/14 12:21 PM  Result Value Ref Range   Alcohol, Ethyl (B) <5 0 - 9 mg/dL    Comment:        LOWEST DETECTABLE LIMIT FOR SERUM ALCOHOL IS 11  mg/dL FOR MEDICAL PURPOSES ONLY   Protime-INR     Status: Abnormal   Collection Time: 09/25/14 12:21 PM  Result Value Ref Range   Prothrombin Time 26.2 (H) 11.6 - 15.2 seconds   INR 2.38 (H) 0.00 - 1.49  APTT     Status: None   Collection Time: 09/25/14 12:21 PM  Result Value Ref Range   aPTT 33 24 - 37 seconds  CBC     Status: Abnormal   Collection Time: 09/25/14 12:21 PM  Result Value Ref Range   WBC 12.8 (H) 4.0 - 10.5 K/uL   RBC 3.20 (L) 4.22 - 5.81 MIL/uL   Hemoglobin 9.8 (L) 13.0 - 17.0 g/dL   HCT 29.2 (L) 39.0 - 52.0 %   MCV 91.3 78.0 - 100.0 fL   MCH 30.6 26.0 - 34.0 pg   MCHC 33.6 30.0 - 36.0 g/dL   RDW 19.4 (H) 11.5 - 15.5 %   Platelets 241 150 - 400 K/uL  Differential     Status: Abnormal   Collection Time: 09/25/14 12:21 PM  Result Value Ref Range   Neutrophils Relative % 84 (H) 43 - 77 %   Neutro Abs 10.6 (H) 1.7 - 7.7 K/uL   Lymphocytes Relative 8 (L) 12 - 46 %   Lymphs Abs 1.1 0.7 - 4.0 K/uL   Monocytes Relative 8 3 - 12 %   Monocytes Absolute 1.1 (H) 0.1 - 1.0 K/uL   Eosinophils Relative 0 0 - 5 %  Eosinophils Absolute 0.0 0.0 - 0.7 K/uL   Basophils Relative 0 0 - 1 %   Basophils Absolute 0.0 0.0 - 0.1 K/uL  Comprehensive metabolic panel     Status: Abnormal   Collection Time: 09/25/14 12:21 PM  Result Value Ref Range   Sodium 135 135 - 145 mmol/L    Comment: Please note change in reference range.   Potassium 4.2 3.5 - 5.1 mmol/L    Comment: Please note change in reference range.   Chloride 108 96 - 112 mEq/L   CO2 18 (L) 19 - 32 mmol/L   Glucose, Bld 108 (H) 70 - 99 mg/dL   BUN 15 6 - 23 mg/dL   Creatinine, Ser 1.21 0.50 - 1.35 mg/dL   Calcium 8.7 8.4 - 10.5 mg/dL   Total Protein 5.2 (L) 6.0 - 8.3 g/dL   Albumin 2.5 (L) 3.5 - 5.2 g/dL   AST 22 0 - 37 U/L   ALT 15 0 - 53 U/L   Alkaline Phosphatase 80 39 - 117 U/L   Total Bilirubin 0.5 0.3 - 1.2 mg/dL   GFR calc non Af Amer 60 (L) >90 mL/min   GFR calc Af Amer 69 (L) >90 mL/min    Comment:  (NOTE) The eGFR has been calculated using the CKD EPI equation. This calculation has not been validated in all clinical situations. eGFR's persistently <90 mL/min signify possible Chronic Kidney Disease.    Anion gap 9 5 - 15  I-Stat Troponin, ED (not at [facility]     Status: None   Collection Time: 09/25/14 12:26 PM  Result Value Ref Range   Troponin i, poc 0.04 0.00 - 0.08 ng/mL   Comment 3            Comment: Due to the release kinetics of cTnI, a negative result within the first hours of the onset of symptoms does not rule out myocardial infarction with certainty. If myocardial infarction is still suspected, repeat the test at appropriate intervals.   I-Stat Chem 8, ED     Status: Abnormal   Collection Time: 09/25/14 12:27 PM  Result Value Ref Range   Sodium 137 135 - 145 mmol/L   Potassium 4.1 3.5 - 5.1 mmol/L   Chloride 104 96 - 112 mEq/L   BUN 17 6 - 23 mg/dL   Creatinine, Ser 1.20 0.50 - 1.35 mg/dL   Glucose, Bld 107 (H) 70 - 99 mg/dL   Calcium, Ion 1.18 1.13 - 1.30 mmol/L   TCO2 19 0 - 100 mmol/L   Hemoglobin 10.2 (L) 13.0 - 17.0 g/dL   HCT 30.0 (L) 39.0 - 52.0 %  MRSA PCR Screening     Status: None   Collection Time: 09/25/14  4:49 PM  Result Value Ref Range   MRSA by PCR NEGATIVE NEGATIVE    Comment:        The GeneXpert MRSA Assay (FDA approved for NASAL specimens only), is one component of a comprehensive MRSA colonization surveillance program. It is not intended to diagnose MRSA infection nor to guide or monitor treatment for MRSA infections.   CK     Status: None   Collection Time: 09/25/14  8:11 PM  Result Value Ref Range   Total CK 91 7 - 232 U/L  Urine Drug Screen     Status: Abnormal   Collection Time: 09/25/14 11:33 PM  Result Value Ref Range   Opiates NONE DETECTED NONE DETECTED   Cocaine NONE DETECTED NONE DETECTED  Benzodiazepines POSITIVE (A) NONE DETECTED   Amphetamines NONE DETECTED NONE DETECTED   Tetrahydrocannabinol NONE DETECTED NONE  DETECTED   Barbiturates NONE DETECTED NONE DETECTED    Comment:        DRUG SCREEN FOR MEDICAL PURPOSES ONLY.  IF CONFIRMATION IS NEEDED FOR ANY PURPOSE, NOTIFY LAB WITHIN 5 DAYS.        LOWEST DETECTABLE LIMITS FOR URINE DRUG SCREEN Drug Class       Cutoff (ng/mL) Amphetamine      1000 Barbiturate      200 Benzodiazepine   675 Tricyclics       916 Opiates          300 Cocaine          300 THC              50   Urinalysis, Routine w reflex microscopic     Status: None   Collection Time: 09/25/14 11:33 PM  Result Value Ref Range   Color, Urine YELLOW YELLOW   APPearance CLEAR CLEAR   Specific Gravity, Urine 1.013 1.005 - 1.030   pH 6.5 5.0 - 8.0   Glucose, UA NEGATIVE NEGATIVE mg/dL   Hgb urine dipstick NEGATIVE NEGATIVE   Bilirubin Urine NEGATIVE NEGATIVE   Ketones, ur NEGATIVE NEGATIVE mg/dL   Protein, ur NEGATIVE NEGATIVE mg/dL   Urobilinogen, UA 0.2 0.0 - 1.0 mg/dL   Nitrite NEGATIVE NEGATIVE   Leukocytes, UA NEGATIVE NEGATIVE    Comment: MICROSCOPIC NOT DONE ON URINES WITH NEGATIVE PROTEIN, BLOOD, LEUKOCYTES, NITRITE, OR GLUCOSE <1000 mg/dL.  Heparin level (unfractionated)     Status: Abnormal   Collection Time: 09/26/14  4:15 AM  Result Value Ref Range   Heparin Unfractionated >2.20 (H) 0.30 - 0.70 IU/mL    Comment: RESULTS CONFIRMED BY MANUAL DILUTION        IF HEPARIN RESULTS ARE BELOW EXPECTED VALUES, AND PATIENT DOSAGE HAS BEEN CONFIRMED, SUGGEST FOLLOW UP TESTING OF ANTITHROMBIN III LEVELS.   CBC     Status: Abnormal   Collection Time: 09/26/14  4:55 AM  Result Value Ref Range   WBC 11.2 (H) 4.0 - 10.5 K/uL   RBC 3.05 (L) 4.22 - 5.81 MIL/uL   Hemoglobin 9.2 (L) 13.0 - 17.0 g/dL   HCT 28.3 (L) 39.0 - 52.0 %   MCV 92.8 78.0 - 100.0 fL   MCH 30.2 26.0 - 34.0 pg   MCHC 32.5 30.0 - 36.0 g/dL   RDW 19.7 (H) 11.5 - 15.5 %   Platelets 273 150 - 400 K/uL  Basic metabolic panel     Status: Abnormal   Collection Time: 09/26/14  4:55 AM  Result Value Ref  Range   Sodium 134 (L) 135 - 145 mmol/L    Comment: Please note change in reference range.   Potassium 5.0 3.5 - 5.1 mmol/L    Comment: Please note change in reference range. DELTA CHECK NOTED    Chloride 105 96 - 112 mEq/L   CO2 24 19 - 32 mmol/L   Glucose, Bld 117 (H) 70 - 99 mg/dL   BUN 15 6 - 23 mg/dL   Creatinine, Ser 1.17 0.50 - 1.35 mg/dL   Calcium 8.3 (L) 8.4 - 10.5 mg/dL   GFR calc non Af Amer 62 (L) >90 mL/min   GFR calc Af Amer 72 (L) >90 mL/min    Comment: (NOTE) The eGFR has been calculated using the CKD EPI equation. This calculation has not been validated in all clinical  situations. eGFR's persistently <90 mL/min signify possible Chronic Kidney Disease.    Anion gap 5 5 - 15  APTT     Status: Abnormal   Collection Time: 09/26/14  4:55 AM  Result Value Ref Range   aPTT 156 (H) 24 - 37 seconds    Comment:        IF BASELINE aPTT IS ELEVATED, SUGGEST PATIENT RISK ASSESSMENT BE USED TO DETERMINE APPROPRIATE ANTICOAGULANT THERAPY.     Ct Head Wo Contrast  09/25/2014   CLINICAL DATA:  68 year old with code stroke. Right-sided weakness. History of esophageal cancer.  EXAM: CT HEAD WITHOUT CONTRAST  TECHNIQUE: Contiguous axial images were obtained from the base of the skull through the vertex without contrast.  COMPARISON:  None  FINDINGS: There is focal low density in the posterior left frontal lobe which is consistent with vasogenic edema and there appears to be an underlying lesion in this area. The lesion roughly measures 1.4 cm but poorly characterized on this non contrast examination. Findings are concerning for metastatic disease based on the history of esophagus cancer. There is no significant midline shift. There is no evidence for acute hemorrhage, hydrocephalus or definite infarct.  No sinuses are clear.  No acute bone abnormality.  IMPRESSION: There is a focus of vasogenic edema in the left frontal lobe. Findings are concerning for a brain lesion and likely  represent metastatic disease. There is no evidence for acute hemorrhage, hydrocephalus or midline shift. Recommend further characterization with MRI.  Critical Value/emergent results were called by telephone at the time of interpretation on 09/25/2014 at 12:48 pm to Dr. Leonel Ramsay, who verbally acknowledged these results.   Electronically Signed   By: Markus Daft M.D.   On: 09/25/2014 12:51   Mr Jeri Cos YT Contrast  09/26/2014   CLINICAL DATA:  New onset seizure. History of esophageal cancer. Initial encounter.  EXAM: MRI HEAD WITHOUT AND WITH CONTRAST  TECHNIQUE: Multiplanar, multiecho pulse sequences of the brain and surrounding structures were obtained without and with intravenous contrast.  CONTRAST:  71m MULTIHANCE GADOBENATE DIMEGLUMINE 529 MG/ML IV SOLN  COMPARISON:  Prior CT from earlier the same day.  FINDINGS: There is an irregular enhancing mass that measures 1.3 x 1.6 x 2.0 cm within the high left posterior left frontal region (series 14, image 19). Heterogeneous T2 signal intensity with restricted diffusion intensity within this lesion suggests central necrosis. There There is associated vasogenic edema within the left frontoparietal region. Finding concerning for possible metastatic disease. No other lesions identified within the brain. No other abnormal enhancement.  Mild T2/FLAIR hyperintensity within the periventricular and deep white matter both cerebral hemispheres noted, nonspecific, but likely related to mild chronic small vessel ischemic disease.  No abnormal foci of restricted diffusion to suggest acute intracranial infarct identified. Gray-white matter differentiation otherwise maintained. Normal intravascular flow voids present.  There is no midline shift. No hydrocephalus. No extra-axial fluid collection.  Craniocervical junction is within normal limits. Scattered degenerative changes present within the visualized upper cervical spine. There appears to be associated a least moderate to  severe central canal stenosis at the C3-4 level, incompletely evaluated on this exam (series 2, image 13).  Pituitary gland within normal limits.  No acute abnormality seen about the orbits.  Paranasal sinuses and mastoid air cells are clear.  No acute abnormalities cm within the scalp soft tissues.  IMPRESSION: 1. 1.3 x 1.6 x 2.0 cm heterogeneously enhancing mass within the high posterior left frontal region, most concerning for metastatic  disease given the history of esophageal carcinoma. No other intracranial lesions or abnormal enhancement identified. 2. No other acute intracranial process. 3. Degenerative changes within the upper cervical spine with associated severe canal stenosis at C3-4, incompletely evaluated on this exam. Further evaluation with MRI the cervical spine could be performed as clinically desired.   Electronically Signed   By: Jeannine Boga M.D.   On: 09/26/2014 00:44   Dg Chest Port 1 View  09/25/2014   CLINICAL DATA:  Seizures.  EXAM: PORTABLE CHEST - 1 VIEW  COMPARISON:  09/14/2014  FINDINGS: Right IJ Port-A-Cath is unchanged with tip in the region of the cavoatrial junction. Lungs are adequately inflated with stable focal opacification over the right midlung which may represent fluid within the fissure. Hazy left base opacification as cannot exclude small effusion with atelectasis versus infection. Mild stable cardiomegaly. Remainder the exam is unchanged.  IMPRESSION: Left base opacification which may represent a small effusion with atelectasis although cannot exclude infection. Stable focal opacification over the right mid lung likely fluid within the fissure.  Stable cardiomegaly.   Electronically Signed   By: Marin Olp M.D.   On: 09/25/2014 13:59    Review of systems not obtained due to patient factors. Blood pressure 102/66, pulse 70, temperature 98 F (36.7 C), temperature source Axillary, resp. rate 0, weight 86.1 kg (189 lb 13.1 oz), SpO2 100 %. The patient is  somnolent. He will awaken to voice. He will state his name and answer simple questions however he drifts off to sleep and is not able to provide more than 1 or 2 word answers to specific questions. Speech appears fluent but again difficult to fully ascertain. Patient with a dense right-sided hammock [place] is. He will flex to deep pain on the right side. He follows commands briskly with his left upper and lower extremities. Cranial nerve function is normal aside from right-sided facial weakness.  Assessment/Plan: The patient has a small solitary metastasis with surrounding edema in his subcortical left posterior frontal region. Ideal treatment for this lesion would be preoperative stereotactic radiosurgery followed by craniotomy and resection. I would like to discuss this with the patient further once his postictal and or postmedication sedation improves. I agree with his current antiepileptic regimen and his IV Decadron.  Thatiana Renbarger A 09/26/2014, 12:55 PM

## 2014-09-27 ENCOUNTER — Inpatient Hospital Stay (HOSPITAL_COMMUNITY): Payer: Medicare Other

## 2014-09-27 ENCOUNTER — Ambulatory Visit
Admit: 2014-09-27 | Discharge: 2014-09-27 | Disposition: A | Discharge status: Home / Self Care | Payer: Medicare Other | Attending: Radiation Oncology | Admitting: Radiation Oncology

## 2014-09-27 DIAGNOSIS — C155 Malignant neoplasm of lower third of esophagus: Secondary | ICD-10-CM

## 2014-09-27 DIAGNOSIS — C159 Malignant neoplasm of esophagus, unspecified: Secondary | ICD-10-CM

## 2014-09-27 DIAGNOSIS — J9601 Acute respiratory failure with hypoxia: Secondary | ICD-10-CM

## 2014-09-27 DIAGNOSIS — J9621 Acute and chronic respiratory failure with hypoxia: Secondary | ICD-10-CM | POA: Diagnosis present

## 2014-09-27 DIAGNOSIS — D649 Anemia, unspecified: Secondary | ICD-10-CM

## 2014-09-27 DIAGNOSIS — C7931 Secondary malignant neoplasm of brain: Secondary | ICD-10-CM

## 2014-09-27 DIAGNOSIS — I10 Essential (primary) hypertension: Secondary | ICD-10-CM

## 2014-09-27 DIAGNOSIS — G40409 Other generalized epilepsy and epileptic syndromes, not intractable, without status epilepticus: Secondary | ICD-10-CM | POA: Diagnosis present

## 2014-09-27 DIAGNOSIS — D72829 Elevated white blood cell count, unspecified: Secondary | ICD-10-CM

## 2014-09-27 DIAGNOSIS — E785 Hyperlipidemia, unspecified: Secondary | ICD-10-CM

## 2014-09-27 DIAGNOSIS — G4089 Other seizures: Secondary | ICD-10-CM

## 2014-09-27 DIAGNOSIS — R410 Disorientation, unspecified: Secondary | ICD-10-CM

## 2014-09-27 DIAGNOSIS — E43 Unspecified severe protein-calorie malnutrition: Secondary | ICD-10-CM

## 2014-09-27 LAB — CBC
HCT: 25.2 % — ABNORMAL LOW (ref 39.0–52.0)
HEMOGLOBIN: 8.4 g/dL — AB (ref 13.0–17.0)
MCH: 31.3 pg (ref 26.0–34.0)
MCHC: 33.3 g/dL (ref 30.0–36.0)
MCV: 94 fL (ref 78.0–100.0)
Platelets: 274 10*3/uL (ref 150–400)
RBC: 2.68 MIL/uL — ABNORMAL LOW (ref 4.22–5.81)
RDW: 19.7 % — AB (ref 11.5–15.5)
WBC: 13.8 10*3/uL — ABNORMAL HIGH (ref 4.0–10.5)

## 2014-09-27 LAB — COMPREHENSIVE METABOLIC PANEL
ALT: 11 U/L (ref 0–53)
ANION GAP: 7 (ref 5–15)
AST: 13 U/L (ref 0–37)
Albumin: 2 g/dL — ABNORMAL LOW (ref 3.5–5.2)
Alkaline Phosphatase: 64 U/L (ref 39–117)
BUN: 18 mg/dL (ref 6–23)
CALCIUM: 8.2 mg/dL — AB (ref 8.4–10.5)
CO2: 21 mmol/L (ref 19–32)
Chloride: 107 mEq/L (ref 96–112)
Creatinine, Ser: 1.16 mg/dL (ref 0.50–1.35)
GFR calc Af Amer: 73 mL/min — ABNORMAL LOW (ref >90)
GFR calc non Af Amer: 63 mL/min — ABNORMAL LOW (ref >90)
Glucose, Bld: 120 mg/dL — ABNORMAL HIGH (ref 70–99)
Potassium: 4.5 mmol/L (ref 3.5–5.1)
SODIUM: 135 mmol/L (ref 135–145)
TOTAL PROTEIN: 4.4 g/dL — AB (ref 6.0–8.3)
Total Bilirubin: 0.5 mg/dL (ref 0.3–1.2)

## 2014-09-27 MED ORDER — PRO-STAT SUGAR FREE PO LIQD
30.0000 mL | Freq: Two times a day (BID) | ORAL | Status: DC
  Administered 2014-09-27 – 2014-10-11 (×24): 30 mL via ORAL
  Filled 2014-09-27 (×34): qty 30

## 2014-09-27 MED ORDER — ENSURE COMPLETE PO LIQD
237.0000 mL | Freq: Two times a day (BID) | ORAL | Status: DC
  Administered 2014-09-27 – 2014-10-11 (×22): 237 mL via ORAL

## 2014-09-27 NOTE — Progress Notes (Signed)
Pt down for MRI, and started to have increasing secretions requiring constant suction with some emesis when laying flat. Zophran given but nothing changed. Concerned that pt will aspirate through the procedure. Pt brought back up to room to monitor and see if able to come down later tonight.

## 2014-09-27 NOTE — Evaluation (Signed)
Clinical/Bedside Swallow Evaluation Patient Details  Name: [name] [name] MRN: [id number] Date of Birth: 09-04-1946  Today's Date: 09/27/2014 Time: 3419-3790 SLP Time Calculation (min) (ACUTE ONLY): 18 min  Past Medical History:  Past Medical History  Diagnosis Date  . GERD (gastroesophageal reflux disease)   . Hyperlipemia   . Barrett's esophagus 12/2013  . History of radiation therapy 02/23/14- 04/04/14    esophagus 5040 cGy 28 sessions  . 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 Hypertension    Past Surgical History:  Past Surgical History  Procedure Laterality Date  . Eus N/A 02/03/2014    Procedure: UPPER ENDOSCOPIC ULTRASOUND (EUS) LINEAR;  Surgeon: Milus Banister, MD;  Location: [facility];  Service: Endoscopy;  Laterality: N/A;   HPI:  [name] is a 68 y.o. male with pmh of esophageal cancer status post previous radiation treatments and ongoing chemotherapy who presented to the emergency room and was just discharged from the hospitalist service 6 days ago for diastolic heart failure and frequent falls. He was brought in today by EMS from his skilled nursing facility with some right arm and facial twitching. There are concerns about seizure activity and while being transported, pt had a witnessed generalized tonic-clonic seizure lasting 3-4 minutes. A MRI scan of the head was concerning for heterogeneously enhancing mass within the high posterior left frontal regionfelt to be metastatic disease from patient's esophageal cancer (lower third), and degenerative changes within the upper cervical spine with associated severe canal stenosis at C3-4   Assessment / Plan / Recommendation Clinical Impression  Pt presents with signs concerning for a primary esophageal dysphagia consistent with history of esopahgeal cancer and radiation (frequent wet belching following minimal PO intake). He also has evidence of oropharyngeal impairment including multiple swallows, wet vocal quailty  and throat clearing that could indicate penetration/aspiration of PO. Suspect esophageal function may be impacting swallow. Given findings of fluid in lungs, history of cancer and probable surgery in the future, recommend objective testing to determine toelrance of POs and best diet. Will f/u with MBS this pm. Pt may consume a soft (dys 2) diet with thin liquids until then. SLP instructed pt in basic esophageal and aspiration precautions.     Aspiration Risk  Moderate    Diet Recommendation Dysphagia 2 (Fine chop);Thin liquid   Liquid Administration via: Cup Medication Administration: Whole meds with liquid Supervision: Staff to assist with self feeding Compensations: Slow rate;Small sips/bites Postural Changes and/or Swallow Maneuvers: Seated upright 90 degrees;Upright 30-60 min after meal    Other  Recommendations Recommended Consults: MBS Oral Care Recommendations: Oral care BID   Follow Up Recommendations  24 hour supervision/assistance    Frequency and Duration min 2x/week  2 weeks   Pertinent Vitals/Pain NA    SLP Swallow Goals     Swallow Study Prior Functional Status       General HPI: [name] is a 68 y.o. male with pmh of esophageal cancer status post previous radiation treatments and ongoing chemotherapy who presented to the emergency room and was just discharged from the hospitalist service 6 days ago for diastolic heart failure and frequent falls. He was brought in today by EMS from his skilled nursing facility with some right arm and facial twitching. There are concerns about seizure activity and while being transported, pt had a witnessed generalized tonic-clonic seizure lasting 3-4 minutes. A MRI scan of the head was concerning for heterogeneously enhancing mass within the high posterior left frontal regionfelt to be metastatic  disease from patient's esophageal cancer (lower third), and degenerative changes within the upper cervical spine with associated severe  canal stenosis at C3-4 Type of Study: Bedside swallow evaluation Previous Swallow Assessment: none in chart Diet Prior to this Study: NPO Temperature Spikes Noted: No Respiratory Status: Nasal cannula History of Recent Intubation: No Behavior/Cognition: Alert;Cooperative;Pleasant mood Oral Cavity - Dentition: Adequate natural dentition Self-Feeding Abilities: Needs assist Patient Positioning: Upright in bed Baseline Vocal Quality: Hoarse Volitional Cough: Strong Volitional Swallow: Able to elicit    Oral/Motor/Sensory Function Overall Oral Motor/Sensory Function: Appears within functional limits for tasks assessed   Ice Chips     Thin Liquid Thin Liquid: Impaired Presentation: Cup;Straw Pharyngeal  Phase Impairments: Wet Vocal Quality;Multiple swallows;Throat Clearing - Immediate    Nectar Thick Nectar Thick Liquid: Not tested   Honey Thick Honey Thick Liquid: Not tested   Puree Puree: Impaired Other Comments: belching   Solid   GO    Solid: Impaired Other Comments: belching      Herbie Baltimore, MA CCC-SLP [phone]  Tyshauna Finkbiner, Katherene Ponto 09/27/2014,10:14 AM

## 2014-09-27 NOTE — Progress Notes (Signed)
Just received a call from an RN navigator Alla German from [facility] asking to put an order for MRI of the brain under the name of Dr.Matthew Manning/oncologist with spec.instructions, this is for preparation for the treatment planning this Thursday at [facility]. Radiation and surgery will be next week.

## 2014-09-27 NOTE — Progress Notes (Signed)
[name] [name]   DOB:November 04, 1945   YS#:[id number]   XNA#:[id number]  Patient Care Team: [name], MD as PCP - General (Family Medicine)  Subjective: [name] [name] is a 68 year old man with a history of metastatic esophageal adenocarcinoma on systemic chemo with cisplatin cisplatin, docetaxel and 5 FU,s/p C6 D1 on 09/05/14, as well as a recent right lower extremity DVT on anticoagulation with Xarelto admitted on 12/28 shortly after a hospitalization from 54/27-06/23 for diastolic heart failure as frequent falls. He was brought by EMS from his SNF with tonic clonic seizures, consisting of right arm twitching, without any other symptoms such as bowel or urinary incontinence, headaches or vision changes. No further seizures. Speech is coherent and clear.He continues to have sone right sided weakness without any other neurological complaints. CT of the head revealed a metastatic brain lesion, non hemorrhagic, without hydrocephalus or Midline shift. MRI brain on 12/28 with and without contrast confirmed a  1.3 x 1.6 x 2.0 cm heterogeneously enhancing mass within the high posterior left frontal region, with surrounding edema worrisome for metastasis.  No other intracranial lesions or abnormal enhancement identified. Neurosurgery was consulted, recomending preoperative stereotactic radiosurgery followed by craniotomy and resection, and  to continue seizure precautions with Keppra, Decadron and Ativan. We were kindly informed of the patient's admission  Brief Oncological History  DIAGNOSIS: Metastatic esophageal adenocarcinoma initially diagnosed as Locally advanced, stage IIIB (T3, N2, M0) distal esophageal adenocarcinoma diagnosed in May of 2015  PRIOR THERAPY: Concurrent chemoradiation with weekly carboplatin for AUC of 2 and paclitaxel 45 mg/M2 for 5 weeks. Last dose was given on 03/29/1999  CURRENT THERAPY: Systemic chemotherapy with cisplatin 75 mg/M2, docetaxel 75 mg/M2 on day 1 and 5-FU 750 mg/M2  continuous infusion for 5 days. For cycle one on 05/23/2014. Status post 6 cycles on 09/05/14, completed on 09/10/14.  Scheduled Meds: . dexamethasone  4 mg Intravenous Q6H  . enoxaparin (LOVENOX) injection  85 mg Subcutaneous Q12H  . lacosamide (VIMPAT) IV  100 mg Intravenous Q12H  . levETIRAcetam  1,500 mg Intravenous Q12H  . lidocaine-prilocaine   Topical Once  . pantoprazole (PROTONIX) IV  40 mg Intravenous Q24H  . tiotropium  18 mcg Inhalation Daily   Continuous Infusions: . sodium chloride 100 mL/hr at 09/26/14 1821   PRN Meds:albuterol, LORazepam, ondansetron **OR** ondansetron (ZOFRAN) IV   Objective:  Filed Vitals:   09/27/14 0742  BP: 101/65  Pulse: 76  Temp: 98.5 F (36.9 C)  Resp: 14      Intake/Output Summary (Last 24 hours) at 09/27/14 1121 Last data filed at 09/27/14 0600  Gross per 24 hour  Intake   2350 ml  Output   1400 ml  Net    950 ml    ECOG PERFORMANCE STATUS: 3  GENERAL:alert, no distress and comfortable SKIN: skin color, texture, turgor are normal, no rashes or significant lesions EYES: normal, conjunctiva are pink and non-injected, sclera clear OROPHARYNX:no exudate, no erythema and lips, buccal mucosa, and tongue normal  NECK: supple, thyroid normal size, non-tender, without nodularity LYMPH:  no palpable lymphadenopathy in the cervical, axillary or inguinal LUNGS: clear to auscultation and percussion with normal breathing effort HEART: regular rate & rhythm and no murmurs and no lower extremity edema ABDOMEN:abdomen soft, non-tender and normal bowel sounds Musculoskeletal:no cyanosis of digits and no clubbing  PSYCH: alert & oriented x 3 with fluent speech NEURO: no focal motor/sensory deficits    CBG (last 3)  No results for input(s): GLUCAP in the  last 72 hours.   Labs:   Recent Labs Lab 09/25/14 1221 09/25/14 1227 09/26/14 0455 09/27/14 0343  WBC 12.8*  --  11.2* 13.8*  HGB 9.8* 10.2* 9.2* 8.4*  HCT 29.2* 30.0* 28.3* 25.2*   PLT 241  --  273 274  MCV 91.3  --  92.8 94.0  MCH 30.6  --  30.2 31.3  MCHC 33.6  --  32.5 33.3  RDW 19.4*  --  19.7* 19.7*  LYMPHSABS 1.1  --   --   --   MONOABS 1.1*  --   --   --   EOSABS 0.0  --   --   --   BASOSABS 0.0  --   --   --      Chemistries:    Recent Labs Lab 09/25/14 1221 09/25/14 1227 09/26/14 0455 09/27/14 0343  NA 135 137 134* 135  K 4.2 4.1 5.0 4.5  CL 108 104 105 107  CO2 18*  --  24 21  GLUCOSE 108* 107* 117* 120*  BUN 15 17 15 18   CREATININE 1.21 1.20 1.17 1.16  CALCIUM 8.7  --  8.3* 8.2*  AST 22  --   --  13  ALT 15  --   --  11  ALKPHOS 80  --   --  64  BILITOT 0.5  --   --  0.5    GFR Estimated Creatinine Clearance: 61.5 mL/min (by C-G formula based on Cr of 1.16).  Liver Function Tests:  Recent Labs Lab 09/25/14 1221 09/27/14 0343  AST 22 13  ALT 15 11  ALKPHOS 80 64  BILITOT 0.5 0.5  PROT 5.2* 4.4*  ALBUMIN 2.5* 2.0*    Urine Studies     Component Value Date/Time   COLORURINE YELLOW 09/25/2014 [place] 09/25/2014 2333   LABSPEC 1.013 09/25/2014 2333   PHURINE 6.5 09/25/2014 2333   GLUCOSEU NEGATIVE 09/25/2014 2333   HGBUR NEGATIVE 09/25/2014 2333   BILIRUBINUR NEGATIVE 09/25/2014 2333   KETONESUR NEGATIVE 09/25/2014 2333   PROTEINUR NEGATIVE 09/25/2014 2333   UROBILINOGEN 0.2 09/25/2014 2333   NITRITE NEGATIVE 09/25/2014 2333   LEUKOCYTESUR NEGATIVE 09/25/2014 2333    Coagulation profile  Recent Labs Lab 09/25/14 1221  INR 2.38*    Cardiac Enzymes:  Recent Labs Lab 09/25/14 2011  CKTOTAL 61   Microbiology Recent Results (from the past 240 hour(s))  MRSA PCR Screening     Status: None   Collection Time: 09/25/14  4:49 PM  Result Value Ref Range Status   MRSA by PCR NEGATIVE NEGATIVE Final    Comment:        The GeneXpert MRSA Assay (FDA approved for NASAL specimens only), is one component of a comprehensive MRSA colonization surveillance program. It is not intended to  diagnose MRSA infection nor to guide or monitor treatment for MRSA infections.        Imaging Studies:  Ct Head Wo Contrast  09/25/2014    COMPARISON:  None  FINDINGS: There is focal low density in the posterior left frontal lobe which is consistent with vasogenic edema and there appears to be an underlying lesion in this area. The lesion roughly measures 1.4 cm but poorly characterized on this non contrast examination. Findings are concerning for metastatic disease based on the history of esophagus cancer. There is no significant midline shift. There is no evidence for acute hemorrhage, hydrocephalus or definite infarct.  No sinuses are clear.  No acute bone  abnormality.  IMPRESSION: There is a focus of vasogenic edema in the left frontal lobe. Findings are concerning for a brain lesion and likely represent metastatic disease. There is no evidence for acute hemorrhage, hydrocephalus or midline shift. Recommend further characterization with MRI.  Critical Value/emergent results were called by telephone at the time of interpretation on 09/25/2014 at 12:48 pm to Dr. Leonel Ramsay, who verbally acknowledged these results.   Electronically Signed   By: Markus Daft M.D.   On: 09/25/2014 12:51   Mr Jeri Cos PI Contrast  COMPARISON:  Prior CT from earlier the same day.  FINDINGS: There is an irregular enhancing mass that measures 1.3 x 1.6 x 2.0 cm within the high left posterior left frontal region (series 14, image 19). Heterogeneous T2 signal intensity with restricted diffusion intensity within this lesion suggests central necrosis. There There is associated vasogenic edema within the left frontoparietal region. Finding concerning for possible metastatic disease. No other lesions identified within the brain. No other abnormal enhancement.  Mild T2/FLAIR hyperintensity within the periventricular and deep white matter both cerebral hemispheres noted, nonspecific, but likely related to mild chronic small vessel  ischemic disease.  No abnormal foci of restricted diffusion to suggest acute intracranial infarct identified. Gray-white matter differentiation otherwise maintained. Normal intravascular flow voids present.  There is no midline shift. No hydrocephalus. No extra-axial fluid collection.  Craniocervical junction is within normal limits. Scattered degenerative changes present within the visualized upper cervical spine. There appears to be associated a least moderate to severe central canal stenosis at the C3-4 level, incompletely evaluated on this exam (series 2, image 13).  Pituitary gland within normal limits.  No acute abnormality seen about the orbits.  Paranasal sinuses and mastoid air cells are clear.  No acute abnormalities cm within the scalp soft tissues.  IMPRESSION: 1. 1.3 x 1.6 x 2.0 cm heterogeneously enhancing mass within the high posterior left frontal region, most concerning for metastatic disease given the history of esophageal carcinoma. No other intracranial lesions or abnormal enhancement identified. 2. No other acute intracranial process. 3. Degenerative changes within the upper cervical spine with associated severe canal stenosis at C3-4, incompletely evaluated on this exam. Further evaluation with MRI the cervical spine could be performed as clinically desired.   Electronically Signed   By: Jeannine Boga M.D.   On: 09/26/2014 00:44   Dg Chest Port 1 View  09/25/2014     COMPARISON:  09/14/2014  FINDINGS: Right IJ Port-A-Cath is unchanged with tip in the region of the cavoatrial junction. Lungs are adequately inflated with stable focal opacification over the right midlung which may represent fluid within the fissure. Hazy left base opacification as cannot exclude small effusion with atelectasis versus infection. Mild stable cardiomegaly. Remainder the exam is unchanged.  IMPRESSION: Left base opacification which may represent a small effusion with atelectasis although cannot exclude  infection. Stable focal opacification over the right mid lung likely fluid within the fissure.  Stable cardiomegaly.   Electronically Signed   By: Marin Olp M.D.   On: 09/25/2014 13:59    Assessment/Plan: 68 y.o.   1. Metastatic esophageal adenocarcinoma On systemic chemo with cisplatin cisplatin, docetaxel and 5 FU,s/p C6 D1 on 09/05/14 with Neulasta on 12./12  2. Metastatic Brain Lesion Patient was found to have 1.3 x 1.6 x 2.0 cm heterogeneously enhancing mass within the high posterior left frontal region with surrounding edema, most concerning for metastatic disease, in the setting of esophageal cancer No other masses seen in  imaging studies He is to undergo preoperative stereotactic radiosurgery followed by craniotomy and resection Will await for path report to confirm results  3. 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 Right lower extremity DVT  In the setting of malignancy Diagnosed on 09/02/14 on anticoagulation with Xarelto as outpatient, now on Lovenox  5.Leukocytosis In the setting of recent Neulasta on 12/12 and Decadron during this hospitalization No intervention is recommended.   Anemia  In the setting of recent chemo, neoplastic disease No transfusion is indicated at this time Monitor closely.  6. Acute respiratory failure with hypoxia Secondary to seizure activity Well controlled with O2 therapy Appreciate primary team involvement.   7. Full Code  8. Severe protein calory malnutrition On Megace.  Appreciate Nutrition involvement Other medical issues, including A fib, hyperlipidemia and hypertension as per admitting team     **Disclaimer: This note was dictated with voice recognition software. Similar sounding words can inadvertently be transcribed and this note may contain transcription errors which may not have been corrected upon publication of note.Sharene Butters E,  PA-C 09/27/2014  11:21 AM  ADDENDUM: Hematology/Oncology Attending: The patient is seen and examined. I agree with the above note. His granddaughter and her friend were at the bedside. The patient has a history of metastatic esophageal adenocarcinoma and recently completed 6 cycles of systemic chemotherapy with cisplatin, docetaxel and continuous infusion 5-FU and tolerated his treatment well. He was supposed to have restaging scan next week for evaluation of his systemic disease. Unfortunately the patient was recently diagnosed with solitary metastatic brain lesion.  His feeling fine today but confused that time. He was seen by neurosurgery as well as [name]. The patient agreed to proceed with the stereotactic radiotherapy followed by surgical resection of the brain tumor. Continue Decadron. I will put his chemotherapy on hold for now. I would see him back for follow-up visit in one months after repeating CT scan of the chest, abdomen and pelvis for restaging of his disease. Thank you so much for taking good care of Mr. Wacha, I will continue to follow up the patient with you and assist in his management on as-needed basis.

## 2014-09-27 NOTE — Procedures (Signed)
Objective Swallowing Evaluation: Modified Barium Swallowing Study  Patient Details  Name: [name] [name] MRN: [id number] Date of Birth: Nov 20, 1945  Today's Date: 09/27/2014 Time: 1330-1400 SLP Time Calculation (min) (ACUTE ONLY): 30 min  Past Medical History:  Past Medical History  Diagnosis Date  . GERD (gastroesophageal reflux disease)   . Hyperlipemia   . Barrett's esophagus 12/2013  . History of radiation therapy 02/23/14- 04/04/14    esophagus 5040 cGy 28 sessions  . 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 Hypertension    Past Surgical History:  Past Surgical History  Procedure Laterality Date  . Eus N/A 02/03/2014    Procedure: UPPER ENDOSCOPIC ULTRASOUND (EUS) LINEAR;  Surgeon: Milus Banister, MD;  Location: [facility];  Service: Endoscopy;  Laterality: N/A;   HPI:  [name] [name] is a 68 y.o. male with pmh of esophageal cancer status post previous radiation treatments and ongoing chemotherapy who presented to the emergency room and was just discharged from the hospitalist service 6 days ago for diastolic heart failure and frequent falls. He was brought in today by EMS from his skilled nursing facility with some right arm and facial twitching. There are concerns about seizure activity and while being transported, pt had a witnessed generalized tonic-clonic seizure lasting 3-4 minutes. A MRI scan of the head was concerning for heterogeneously enhancing mass within the high posterior left frontal regionfelt to be metastatic disease from patient's esophageal cancer (lower third), and degenerative changes within the upper cervical spine with associated severe canal stenosis at C3-4     Assessment / Plan / Recommendation Clinical Impression  Dysphagia Diagnosis: Mild pharyngeal phase dysphagia Clinical impression: Pt demonstrates a mild pharyngeal phase dysphagia characterized by sensory deficits resulting in delayed swallow initiation and silent aspiration of mixed consistencies  (thin and barium tablet). No aspiration noted across trials of thin, puree, or regular textures when consumed individually.  Pt has a hx of GERD, esophageal cancer, and radiation treatments. Suspect that a lifetime of reflux may be playing a role in reduced sensation leading to the above deficits. Recommend dysphagia 2 (chopped) diet, due to pt's preference for softer foods, and thin liquids. Medications to be given whole in puree. SLP will follow briefly to ensure diet tolerance.    Treatment Recommendation  Therapy as outlined in treatment plan below    Diet Recommendation Dysphagia 2 (Fine chop);Thin liquid   Liquid Administration via: Cup;Straw Medication Administration: Whole meds with puree Supervision: Staff to assist with self feeding Compensations: Slow rate;Small sips/bites Postural Changes and/or Swallow Maneuvers: Seated upright 90 degrees;Upright 30-60 min after meal    Other  Recommendations Oral Care Recommendations: Oral care BID   Follow Up Recommendations  24 hour supervision/assistance    Frequency and Duration min 2x/week  2 weeks   Pertinent Vitals/Pain none    SLP Swallow Goals     General Date of Onset: 09/25/14 HPI: [name] [name] is a 68 y.o. male with pmh of esophageal cancer status post previous radiation treatments and ongoing chemotherapy who presented to the emergency room and was just discharged from the hospitalist service 6 days ago for diastolic heart failure and frequent falls. He was brought in today by EMS from his skilled nursing facility with some right arm and facial twitching. There are concerns about seizure activity and while being transported, pt had a witnessed generalized tonic-clonic seizure lasting 3-4 minutes. A MRI scan of the head was concerning for heterogeneously enhancing mass within the high posterior left frontal regionfelt  to be metastatic disease from patient's esophageal cancer (lower third), and degenerative changes within the  upper cervical spine with associated severe canal stenosis at C3-4 Type of Study: Modified Barium Swallowing Study Reason for Referral: Objectively evaluate swallowing function Previous Swallow Assessment: BSE, 12/29 Diet Prior to this Study: Dysphagia 2 (chopped);Thin liquids Temperature Spikes Noted: No Respiratory Status: Nasal cannula History of Recent Intubation: No Behavior/Cognition: Alert;Cooperative;Pleasant mood;Requires cueing Oral Cavity - Dentition: Adequate natural dentition Oral Motor / Sensory Function: Within functional limits Self-Feeding Abilities: Able to feed self;Needs assist Patient Positioning: Upright in chair Baseline Vocal Quality: Hoarse Volitional Cough: Strong Volitional Swallow: Able to elicit Anatomy: Within functional limits Pharyngeal Secretions: Not observed secondary MBS    Reason for Referral Objectively evaluate swallowing function   Oral Phase Oral Preparation/Oral Phase Oral Phase: WFL   Pharyngeal Phase Pharyngeal Phase Pharyngeal Phase: Impaired Pharyngeal - Thin Pharyngeal - Thin Cup: Delayed swallow initiation;Premature spillage to pyriform sinuses Pharyngeal - Thin Straw: Delayed swallow initiation;Premature spillage to pyriform sinuses Pharyngeal - Solids Pharyngeal - Puree: Within functional limits Pharyngeal - Regular: Within functional limits Pharyngeal - Pill: Delayed swallow initiation;Premature spillage to valleculae;Penetration/Aspiration before swallow Penetration/Aspiration details (pill): Material enters airway, passes BELOW cords without attempt by patient to eject out (silent aspiration)  Cervical Esophageal Phase    GO    Cervical Esophageal Phase Cervical Esophageal Phase: Impaired Cervical Esophageal Phase - Thin Thin Cup: Within functional limits Cervical Esophageal Phase - Solids Puree: Within functional limits Pill:  (appearance of stasis in the distal esophagus)         [name] 09/27/2014,  2:30 PM

## 2014-09-27 NOTE — Clinical Social Work Note (Signed)
Clinical Social Work Department BRIEF PSYCHOSOCIAL ASSESSMENT 09/27/2014  Patient:  [name] [name], [name] [name]     Account Number:  [id number]     Admit date:  09/25/2014  Clinical Social Worker:  Domenica Reamer, [place]  Date/Time:  09/27/2014 12:30 PM  Referred by:  Physician  Date Referred:  09/27/2014 Referred for  SNF Placement   Other Referral:   Interview type:  Patient Other interview type:   patients grandaughter, [name] also present at bedside    PSYCHOSOCIAL DATA Living Status:  FACILITY Admitted from facility:  [facility] Level of care:  [place] Primary support name:  [name] Primary support relationship to patient:  FAMILY Degree of support available:   high level of emotional support- patients grandaughter willing to uproot from [place] if patient needs her involvement here.    CURRENT CONCERNS Current Concerns  Post-Acute Placement   Other Concerns:   financial POA- CSW encouraged patients grandaughter to speak with legal professional or go to bank to add grandaughters name to accounts so that she may write checks for SNF etc    SOCIAL WORK ASSESSMENT / PLAN CSW spoke with grandaughter and patient concerning return to [employer] at time of DC.  Patient was somewhat lethargic and grandaughter primarily answered questions. Pt grandaughter is agreeable to patient return to SNF for short term but is considering living with patient and providing care for him as well.  CSW will continue to follow.   Assessment/plan status:  Psychosocial Support/Ongoing Assessment of Needs Other assessment/ plan:   Fl2 update   Information/referral to community resources:    PATIENT'S/FAMILY'S RESPONSE TO PLAN OF CARE: Patient and grandaughter are agreeable to plan for return to SNF if that is what is recommended by physical therapy and grandaughter is hopeful that treatment will help patient comfort.       Domenica Reamer, [place] Social Worker [phone]

## 2014-09-27 NOTE — Consult Note (Signed)
Radiation Oncology         [phone] ________________________________  Initial outpatient Consultation  Name: [name] [name]  MRN: [id number]  Date: 09/25/2014  DOB: [birth year]  EL:FYBOFB,PZWCH VINCENT, MD  No ref. 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 Brain metastases 198.3 C79.31    HISTORY OF PRESENT ILLNESS::[name] [name] is a 68 y.o. 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 He received concurrent chemoradiotherapy under the care of my colleague, Dr. Valere Dross to radiation dose of 50.4 Gy with weekly carboplatin for AUC of 2 and paclitaxel 45 mg/M2 for 5 weeks.  The patient experienced an excellent local response on PET CT in August 2015 but was found to have progressive development of skeletal metastases and liver metastasis. For salvage therapy, he has subsequently received systemic chemotherapy with cisplatin 75 mg/M2, docetaxel 75 mg/M2 on day 1 and 5-FU 750 mg/M2 continuous infusion for 5 days. For a cycle on 05/23/2014. Status post 4 cycles.  The patient presented to the emergency room yesterday with new onset seizures described as protracted simple partial seizures involving the right face and right upper extremity and to a lesser extent the right lower extremity.  Consequently, head CT on 09/25/2014 demonstrated focal low density in the posterior left frontal lobe with vasogenic edema and  An apparent underlying lesion in this area. The lesion roughly measured 1.4 cm but poorly characterized on the non contrast examination. Findings were concerning for metastatic disease based on the history of esophagus cancer.    Subsequent brain MRI better delineated an irregular enhancing mass that measures 1.3 x 1.6 x 2.0 cm within the high left posterior left frontal region:      The patient was seen by neurosurgery and was felt to potentially benefit from surgical resection of this solitary brain metastasis. He has kindly been referred today to discuss coordination of radiation treatment in conjunction with surgery.  PREVIOUS RADIATION THERAPY: Yes as above  PAST MEDICAL HISTORY:  has a past medical history of GERD (gastroesophageal reflux disease); Hyperlipemia; Barrett's esophagus (12/2013); History of radiation therapy (02/23/14- 04/04/14); Esophageal cancer (01/26/14); and Hypertension.    PAST SURGICAL HISTORY: Past Surgical History  Procedure Laterality Date  . Eus N/A 02/03/2014    Procedure: UPPER ENDOSCOPIC ULTRASOUND (EUS) LINEAR;  Surgeon: Milus Banister, MD;  Location: [facility];  Service: Endoscopy;  Laterality: N/A;    FAMILY HISTORY: family history includes Breast cancer in his sister.  SOCIAL HISTORY:  reports that he quit smoking about 26 years ago. His smoking use included Cigarettes. He has a 70 pack-year smoking history. He has never used smokeless tobacco. He reports that he drinks alcohol. He reports that he does not use illicit drugs.  ALLERGIES: Review of patient's allergies indicates no known allergies.  MEDICATIONS:  Current Facility-Administered Medications  Medication Dose Route Frequency Provider Last Rate Last Dose  . 0.9 %  sodium chloride infusion   Intravenous Continuous Cherene Altes, MD 100 mL/hr at 09/27/14 1125    . albuterol (PROVENTIL) (2.5 MG/3ML) 0.083% nebulizer solution 2.5 mg  2.5 mg Nebulization Q2H PRN Annita Brod, MD      . dexamethasone (DECADRON) injection 4 mg  4 mg Intravenous Q6H Annita Brod, MD   4 mg at 09/27/14 2032  . enoxaparin (LOVENOX) injection 85 mg  85  mg Subcutaneous Q12H Cherene Altes, MD   85 mg at 09/27/14 2032  . feeding supplement (ENSURE COMPLETE) (ENSURE COMPLETE) liquid 237 mL  237 mL Oral BID BM Baird Lyons, RD   237 mL at 09/27/14 1730  . feeding supplement (PRO-STAT  SUGAR FREE 64) liquid 30 mL  30 mL Oral BID WC Baird Lyons, RD   30 mL at 09/27/14 1829  . lacosamide (VIMPAT) 100 mg in sodium chloride 0.9 % 25 mL IVPB  100 mg Intravenous Q12H Amie Portland, MD   100 mg at 09/27/14 1055  . levETIRAcetam (KEPPRA) IVPB 1500 mg/ 100 mL premix  1,500 mg Intravenous Q12H Amie Portland, MD   1,500 mg at 09/27/14 1306  . LORazepam (ATIVAN) injection 0.5 mg  0.5 mg Intravenous Q4H PRN Samella Parr, NP   0.5 mg at 09/26/14 1036  . ondansetron (ZOFRAN) tablet 4 mg  4 mg Oral Q6H PRN Annita Brod, MD       Or  . ondansetron [facility]) injection 4 mg  4 mg Intravenous Q6H PRN Annita Brod, MD   4 mg at 09/27/14 2030  . pantoprazole (PROTONIX) injection 40 mg  40 mg Intravenous Q24H Cherene Altes, MD   40 mg at 09/27/14 1834  . tiotropium (SPIRIVA) inhalation capsule 18 mcg  18 mcg Inhalation Daily Annita Brod, MD   18 mcg at 09/27/14 1026   Facility-Administered Medications Ordered in Other Encounters  Medication Dose Route Frequency Provider Last Rate Last Dose  . sodium chloride 0.9 % injection 10 mL  10 mL Intracatheter PRN Curt Bears, MD   10 mL at 05/28/14 1235    REVIEW OF SYSTEMS:  A 15 point review of systems is documented in the electronic medical record. This was obtained by the nursing staff. However, I reviewed this with the patient to discuss relevant findings and make appropriate changes.  Pertinent items are noted in HPI. the patient now still has some right hemiparesis   PHYSICAL EXAM:  height is 5\' 5"  (1.651 m) and weight is 189 lb 13.1 oz (86.1 kg). His axillary temperature is 97.8 F (36.6 C). His blood pressure is 108/74 and his pulse is 84. His respiration is 20 and oxygen saturation is 100%.   Per hospitalist Gen: No acute respiratory distress although noted with tachypnea and labored respiratory pattern during seizure activity Neurological: Observed focal seizure activity involving right face and right upper  extremity with postictal phase upon resolution of seizure activity Chest: Clear to auscultation bilaterally without wheezes or crackles, 2 L Cardiac: Regular rate and rhythm, S1-S2, no rubs murmurs or gallops Abdomen: Soft nontender nondistended without obvious hepatosplenomegaly, no ascites Extremities: no signif c/c/e B le  Per neurosurgery earlier today, He is awake and aware. Speech is reasonably fluent. His content is appropriate. Judgment and insight appear intact. He still has a significant right-sided hemiparesis left-sided motor function is intact.   KPS = 80  100 - Normal; no complaints; no evidence of disease. 90   - Able to carry on normal activity; minor signs or symptoms of disease. 80   - Normal activity with effort; some signs or symptoms of disease. 71   - Cares for self; unable to carry on normal activity or to do active work. 60   - Requires occasional assistance, but is able to care for most of his personal needs. 50   - Requires considerable assistance and frequent medical care. 40   -  Disabled; requires special care and assistance. 79   - Severely disabled; hospital admission is indicated although death not imminent. 17   - Very sick; hospital admission necessary; active supportive treatment necessary. 10   - Moribund; fatal processes progressing rapidly. 0     - Dead  Karnofsky DA, Abelmann [place], Craver LS and Burchenal [facility] [phone]) The use of the nitrogen mustards in the palliative treatment of carcinoma: with particular reference to bronchogenic carcinoma Cancer 1 634-56  LABORATORY DATA:  Lab Results  Component Value Date   WBC 13.8* 09/27/2014   HGB 8.4* 09/27/2014   HCT 25.2* 09/27/2014   MCV 94.0 09/27/2014   PLT 274 09/27/2014   Lab Results  Component Value Date   NA 135 09/27/2014   K 4.5 09/27/2014   CL 107 09/27/2014   CO2 21 09/27/2014   Lab Results  Component Value Date   ALT 11 09/27/2014   AST 13 09/27/2014   ALKPHOS 64 09/27/2014   BILITOT  0.5 09/27/2014     RADIOGRAPHY: Dg Chest 2 View  09/14/2014   CLINICAL DATA:  Recent falls due to weakness. History of lung cancer.  EXAM: CHEST  2 VIEW  COMPARISON:  Chest CT 06/1914  FINDINGS: Port-A-Cath tip in the lower SVC region. Chronic changes at the lung bases compatible with bronchiectasis. New densities in the right mid lung are suggestive for fluid or densities in the right minor fissure. Heart size is normal. Negative for pneumothorax. Bony structures appear to be intact.  IMPRESSION: There is new fluid or densities in the right minor fissure region.  Chronic changes and bronchiectasis at the lung bases.   Electronically Signed   By: Markus Daft M.D.   On: 09/14/2014 20:18   Ct Head Wo Contrast  09/25/2014   CLINICAL DATA:  68 year old with code stroke. Right-sided weakness. History of esophageal cancer.  EXAM: CT HEAD WITHOUT CONTRAST  TECHNIQUE: Contiguous axial images were obtained from the base of the skull through the vertex without contrast.  COMPARISON:  None  FINDINGS: There is focal low density in the posterior left frontal lobe which is consistent with vasogenic edema and there appears to be an underlying lesion in this area. The lesion roughly measures 1.4 cm but poorly characterized on this non contrast examination. Findings are concerning for metastatic disease based on the history of esophagus cancer. There is no significant midline shift. There is no evidence for acute hemorrhage, hydrocephalus or definite infarct.  No sinuses are clear.  No acute bone abnormality.  IMPRESSION: There is a focus of vasogenic edema in the left frontal lobe. Findings are concerning for a brain lesion and likely represent metastatic disease. There is no evidence for acute hemorrhage, hydrocephalus or midline shift. Recommend further characterization with MRI.  Critical Value/emergent results were called by telephone at the time of interpretation on 09/25/2014 at 12:48 pm to Dr. Leonel Ramsay, who  verbally acknowledged these results.   Electronically Signed   By: Markus Daft M.D.   On: 09/25/2014 12:51   Mr Jeri Cos NT Contrast  09/26/2014   CLINICAL DATA:  New onset seizure. History of esophageal cancer. Initial encounter.  EXAM: MRI HEAD WITHOUT AND WITH CONTRAST  TECHNIQUE: Multiplanar, multiecho pulse sequences of the brain and surrounding structures were obtained without and with intravenous contrast.  CONTRAST:  68mL MULTIHANCE GADOBENATE DIMEGLUMINE 529 MG/ML IV SOLN  COMPARISON:  Prior CT from earlier the same day.  FINDINGS: There is an irregular enhancing mass that measures 1.3  x 1.6 x 2.0 cm within the high left posterior left frontal region (series 14, image 19). Heterogeneous T2 signal intensity with restricted diffusion intensity within this lesion suggests central necrosis. There There is associated vasogenic edema within the left frontoparietal region. Finding concerning for possible metastatic disease. No other lesions identified within the brain. No other abnormal enhancement.  Mild T2/FLAIR hyperintensity within the periventricular and deep white matter both cerebral hemispheres noted, nonspecific, but likely related to mild chronic small vessel ischemic disease.  No abnormal foci of restricted diffusion to suggest acute intracranial infarct identified. Gray-white matter differentiation otherwise maintained. Normal intravascular flow voids present.  There is no midline shift. No hydrocephalus. No extra-axial fluid collection.  Craniocervical junction is within normal limits. Scattered degenerative changes present within the visualized upper cervical spine. There appears to be associated a least moderate to severe central canal stenosis at the C3-4 level, incompletely evaluated on this exam (series 2, image 13).  Pituitary gland within normal limits.  No acute abnormality seen about the orbits.  Paranasal sinuses and mastoid air cells are clear.  No acute abnormalities cm within the scalp  soft tissues.  IMPRESSION: 1. 1.3 x 1.6 x 2.0 cm heterogeneously enhancing mass within the high posterior left frontal region, most concerning for metastatic disease given the history of esophageal carcinoma. No other intracranial lesions or abnormal enhancement identified. 2. No other acute intracranial process. 3. Degenerative changes within the upper cervical spine with associated severe canal stenosis at C3-4, incompletely evaluated on this exam. Further evaluation with MRI the cervical spine could be performed as clinically desired.   Electronically Signed   By: Jeannine Boga M.D.   On: 09/26/2014 00:44   Dg Chest Port 1 View  09/25/2014   CLINICAL DATA:  Seizures.  EXAM: PORTABLE CHEST - 1 VIEW  COMPARISON:  09/14/2014  FINDINGS: Right IJ Port-A-Cath is unchanged with tip in the region of the cavoatrial junction. Lungs are adequately inflated with stable focal opacification over the right midlung which may represent fluid within the fissure. Hazy left base opacification as cannot exclude small effusion with atelectasis versus infection. Mild stable cardiomegaly. Remainder the exam is unchanged.  IMPRESSION: Left base opacification which may represent a small effusion with atelectasis although cannot exclude infection. Stable focal opacification over the right mid lung likely fluid within the fissure.  Stable cardiomegaly.   Electronically Signed   By: Marin Olp M.D.   On: 09/25/2014 13:59   Dg Swallowing Func-speech Pathology  09/27/2014   [name], [place]     09/27/2014  2:35 PM Objective Swallowing Evaluation: Modified Barium Swallowing Study   Patient Details  Name: [name] [name] MRN: [id number] Date of Birth: 04-25-1946  Today's Date: 09/27/2014 Time: 1330-1400 SLP Time Calculation (min) (ACUTE ONLY): 30 min  Past Medical History:  Past Medical History  Diagnosis Date  . GERD (gastroesophageal reflux disease)   . Hyperlipemia   . Barrett's esophagus 12/2013  . History of  radiation therapy 02/23/14- 04/04/14    esophagus 5040 cGy 28 sessions  . 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 Hypertension    Past Surgical History:  Past Surgical History  Procedure Laterality Date  . Eus N/A 02/03/2014    Procedure: UPPER ENDOSCOPIC ULTRASOUND (EUS) LINEAR;  Surgeon:  Milus Banister, MD;  Location: [facility];  Service:  Endoscopy;  Laterality: N/A;   HPI:  [name] [name] is a 68 y.o. male with pmh of esophageal cancer  status post previous radiation  treatments and ongoing  chemotherapy who presented to the emergency room and was just  discharged from the hospitalist service 6 days ago for diastolic  heart failure and frequent falls. He was brought in today by EMS  from his skilled nursing facility with some right arm and facial  twitching. There are concerns about seizure activity and while  being transported, pt had a witnessed generalized tonic-clonic  seizure lasting 3-4 minutes. A MRI scan of the head was  concerning for heterogeneously enhancing mass within the high  posterior left frontal regionfelt to be metastatic disease from  patient's esophageal cancer (lower third), and degenerative  changes within the upper cervical spine with associated severe  canal stenosis at C3-4     Assessment / Plan / Recommendation Clinical Impression  Dysphagia Diagnosis: Mild pharyngeal phase dysphagia Clinical impression: Pt demonstrates a mild pharyngeal phase  dysphagia characterized by sensory deficits resulting in delayed  swallow initiation and silent aspiration of mixed consistencies  (thin and barium tablet). No aspiration noted across trials of  thin, puree, or regular textures when consumed individually.  Pt  has a hx of GERD, esophageal cancer, and radiation treatments.  Suspect that a lifetime of reflux may be playing a role in  reduced sensation leading to the above deficits. Recommend  dysphagia 2 (chopped) diet, due to pt's preference for softer  foods, and thin liquids. Medications  to be given whole in puree.  SLP will follow briefly to ensure diet tolerance.    Treatment Recommendation  Therapy as outlined in treatment plan below    Diet Recommendation Dysphagia 2 (Fine chop);Thin liquid   Liquid Administration via: Cup;Straw Medication Administration: Whole meds with puree Supervision: Staff to assist with self feeding Compensations: Slow rate;Small sips/bites Postural Changes and/or Swallow Maneuvers: Seated upright 90  degrees;Upright 30-60 min after meal    Other  Recommendations Oral Care Recommendations: Oral care BID   Follow Up Recommendations  24 hour supervision/assistance    Frequency and Duration min 2x/week  2 weeks   Pertinent Vitals/Pain none    SLP Swallow Goals     General Date of Onset: 09/25/14 HPI: [name] [name] is a 68 y.o. male with pmh of esophageal  cancer status post previous radiation treatments and ongoing  chemotherapy who presented to the emergency room and was just  discharged from the hospitalist service 6 days ago for diastolic  heart failure and frequent falls. He was brought in today by EMS  from his skilled nursing facility with some right arm and facial  twitching. There are concerns about seizure activity and while  being transported, pt had a witnessed generalized tonic-clonic  seizure lasting 3-4 minutes. A MRI scan of the head was  concerning for heterogeneously enhancing mass within the high  posterior left frontal regionfelt to be metastatic disease from  patient's esophageal cancer (lower third), and degenerative  changes within the upper cervical spine with associated severe  canal stenosis at C3-4 Type of Study: Modified Barium Swallowing Study Reason for Referral: Objectively evaluate swallowing function Previous Swallow Assessment: BSE, 12/29 Diet Prior to this Study: Dysphagia 2 (chopped);Thin liquids Temperature Spikes Noted: No Respiratory Status: Nasal cannula History of Recent Intubation: No Behavior/Cognition: Alert;Cooperative;Pleasant  mood;Requires  cueing Oral Cavity - Dentition: Adequate natural dentition Oral Motor / Sensory Function: Within functional limits Self-Feeding Abilities: Able to feed self;Needs assist Patient Positioning: Upright in chair Baseline Vocal Quality: Hoarse Volitional Cough: Strong Volitional Swallow: Able to elicit Anatomy: Within functional limits Pharyngeal Secretions:  Not observed secondary MBS    Reason for Referral Objectively evaluate swallowing function   Oral Phase Oral Preparation/Oral Phase Oral Phase: WFL   Pharyngeal Phase Pharyngeal Phase Pharyngeal Phase: Impaired Pharyngeal - Thin Pharyngeal - Thin Cup: Delayed swallow initiation;Premature  spillage to pyriform sinuses Pharyngeal - Thin Straw: Delayed swallow initiation;Premature  spillage to pyriform sinuses Pharyngeal - Solids Pharyngeal - Puree: Within functional limits Pharyngeal - Regular: Within functional limits Pharyngeal - Pill: Delayed swallow initiation;Premature spillage  to valleculae;Penetration/Aspiration before swallow Penetration/Aspiration details (pill): Material enters airway,  passes BELOW cords without attempt by patient to eject out  (silent aspiration)  Cervical Esophageal Phase    GO    Cervical Esophageal Phase Cervical Esophageal Phase: Impaired Cervical Esophageal Phase - Thin Thin Cup: Within functional limits Cervical Esophageal Phase - Solids Puree: Within functional limits Pill:  (appearance of stasis in the distal esophagus)         [name] 09/27/2014, 2:30 PM       IMPRESSION: This patient is a very nice 68 year old gentleman with a 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  The patient would benefit from surgical resection of his brain metastasis.*  In addition, the patient would potentially benefit from radiotherapy.* The options include whole brain irradiation versus stereotactic radiosurgery. There are pros and cons associated with each of these potential treatment  options. Whole brain radiotherapy would treat the known metastatic deposits and help provide some reduction of risk for future brain metastases. However, whole brain radiotherapy carries potential risks including hair loss, subacute somnolence, and neurocognitive changes including a possible reduction in short-term memory. Whole brain radiotherapy also may carry a lower likelihood of tumor control at the treatment sites because of the low-dose used. Stereotactic radiosurgery carries a higher likelihood for local tumor control at the targeted sites with lower associated risk for neurocognitive changes such as memory loss.* However, the use of stereotactic radiosurgery in this setting may leave the patient at increased risk for new brain metastases elsewhere in the brain as high as 50-60%. Accordingly, patients who receive stereotactic radiosurgery in this setting should undergo ongoing surveillance imaging with brain MRI more frequently in order to identify and treat new small brain metastases before they become symptomatic. Stereotactic radiosurgery does carry some different risks, including a risk of radionecrosis.  PLAN: Today, I reviewed the findings and workup thus far with the patient. We discussed the dilemma regarding whole brain radiotherapy versus stereotactic radiosurgery. We discussed the pros and cons of each. We also discussed the logistics and delivery of each. We reviewed the results associated with each of the treatments described above. The patient seems to understand the treatment options and would like to proceed with stereotactic radiosurgery.  In terms of timing of the stereotactic radiosurgery, evidence suggests that risk of radionecrosis and leptomeningeal recurrence is lower when used in the pre-operative setting as opposed to post-operative SRS.*  Tentatively, the patient will be set up for [facility] CT Simulation on Thursday 12/31, SRS Treatment on 1/5, and Surgical resection on 1/5 or  1/7.  I spent face to face time with the patient and more than 50% of that time was spent in counseling and/or coordination of care.    ------------------------------------------------  Sheral Apley. Tammi Klippel, M.D.     *References:  1: Patchell RA, Tibbs PA, Helene Shoe, [address] RJ, [place], Guy Sandifer JS, Young B. A randomized trial of surgery in the treatment of single metastases to the  brain. [place] Feb 22;322(8):494-500. PubMed PMID: [id number].   2: Patchell RA, Tibbs PA, Regine WF, Jonita Albee, Mohiuddin M, Arrie [place], [place], [place], Young B. Postoperative radiotherapy in the treatment of single metastases to the brain: a randomized trial. JAMA. 1998 Nov 4;280(17):1485-9. PubMed PMID: [id number].   3: Erlene Senters, Wenda Low, Hess KR, Tomie China, Lang FF, Kornguth DG, [place], Swint JM, Shiu AS, Maor MH, [place]. Neurocognition in patients with brain metastases treated with radiosurgery or radiosurgery plus whole-brain irradiation: a randomised controlled trial. Lancet Oncol. 2009 Nov;10(11):1037-44. doi: 10.1016/S1470-2045(09)70263-3. Epub 2009 Oct 2. PubMed PMID: [id number].  4: Weyman Rodney, Estill Dooms, Coralee Pesa, Crocker IR, Lorie Phenix, Charlesetta Garibaldi, Press RH, Tanya Nones, Rivers NM, Wait SD, Higinio Plan, Shu HG, Alianza [place]. Comparing Preoperative With Postoperative Stereotactic Radiosurgery for Resectable Brain Metastases: A Multi-institutional Analysis. Neurosurgery. 2015 Nov 2. [Epub ahead of print] PubMed PMID: [id number].

## 2014-09-27 NOTE — Progress Notes (Signed)
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 Currently on chemotherapy followed by [name]. Recently diagnosed with DVT on 09/02/14 and started initially on Lovenox w/ transition to Xarelto. Recent admission to the hospital for diastolic dysfunction and was discharged on 12/21. At skilled nursing facility noted to have focal seizure activity involving right arm. Transported via EMS. En route noted to have generalized tonic-clonic seizure that responded to IV Ativan. Upon arrival to ER continued right facial and upper extremity twitching. Given IV Keppra and Dilantin. CT scan the head concerning for brain lesion likely metastatic but no evidence of hemorrhage, hydrocephalus, or midline shift. Neurology evaluated the patient. MRI ordered.  Since admission MRI confirmed left frontal region mass measuring 1.3 x 1.6 x 2 cm. Started on IV Decadron. Neurosurgery and Oncology consulted. Because of metastatic brain lesion anticoagulation held.  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 -New finding - presented w/ seizure activity; now that seizures have resolved pt with residual dense right arm hemiplegia, right facial droop, and tongue deviation to left - Neurosurgery rec stereotactic radiosurgery coupled with surgical resection and nonoperative management - continue IV Decadron- Oncology following/await surgical bx and subsequent pathology- rad oncology formal consultation pending-PT/OT following -Brain MRI pending    Stage IIIB esophageal cancer with metastases -As above - cont IVF- SLP rec D2 diet with thin liquids    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 -Appreciate Neurology assistance - continue IV Keppra; Vimpat added 12/28 due to ongoing seizure activity -no further seizures past 12 hours   Acute respiratory failure with hypoxia -Directly related to altered mentation and recurrent seizures - continue supportive care with oxygen - monitor for signs of aspiration     Hypertension -Blood pressure remains soft so metoprolol, valsartan and hydrochlorothiazide on hold    Hyperlipemia    DVT of lower extremity  -New diagnosis 12/4 - anticoagulation to be resumed w/ lovenox - recent study supports it's safe use in this clinical scenario (Intracranial hemorrhage in patients with brain metastases treated with therapeutic enoxaparin: a matched cohort study - Lenna Sciara, Federico Campigotto, Delene Ruffini. Smiley Houseman [place], Loman Chroman M. Weber, Romona Curls Blood Jan 2015, DOI: [id number])    BPH     A-fib -Rate controlled    Severe protein-calorie malnutrition -On Megace prior to admission  DVT prophylaxis: lovenox  Code Status: Full Family Communication: No family at bedside Disposition Plan/Expected LOS: Stepdown  Consultants: Neurology/[name] Neurosurgery/Dr. Pool Oncology/[name]  Procedures: 2D ECHO: - Left ventricle: The cavity size was normal. Wall thickness was normal. Systolic function was normal. The estimated ejection fraction was in the range of 60% to 65%. Wall motion was normal; there were no regional wall motion abnormalities. - Atrial septum: No defect or patent foramen ovale was identified. - Pulmonary arteries: PA peak pressure: 33 mm Hg (S).  Antibiotics: None  Objective: Blood pressure 103/60, pulse 75, temperature 97.7 F (36.5 C), temperature source Axillary, resp. rate 12, height 5\' 5"  (1.651 m), weight 189 lb 13.1 oz (86.1 kg), SpO2 100 %.  Intake/Output Summary (Last 24 hours) at 09/27/14 1357 Last data filed at 09/27/14  0600  Gross per 24  hour  Intake   2100 ml  Output   1000 ml  Net   1100 ml    Exam: Gen: No acute respiratory distress  Neurological: Dense RUE hemiplegia, right facial droop and tongue deviates to left; o/w neuro intact Chest: Clear to auscultation bilaterally without wheezes or crackles, 2 L Cardiac: Regular rate and rhythm, S1-S2, no rubs murmurs or gallops Abdomen: Soft nontender nondistended without obvious hepatosplenomegaly, no ascites Extremities: no signif c/c/e B le   Scheduled Meds:  Scheduled Meds: . dexamethasone  4 mg Intravenous Q6H  . enoxaparin (LOVENOX) injection  85 mg Subcutaneous Q12H  . lacosamide (VIMPAT) IV  100 mg Intravenous Q12H  . levETIRAcetam  1,500 mg Intravenous Q12H  . pantoprazole (PROTONIX) IV  40 mg Intravenous Q24H  . tiotropium  18 mcg Inhalation Daily    Data Reviewed: Basic Metabolic Panel:  Recent Labs Lab 09/25/14 1221 09/25/14 1227 09/26/14 0455 09/27/14 0343  NA 135 137 134* 135  K 4.2 4.1 5.0 4.5  CL 108 104 105 107  CO2 18*  --  24 21  GLUCOSE 108* 107* 117* 120*  BUN 15 17 15 18   CREATININE 1.21 1.20 1.17 1.16  CALCIUM 8.7  --  8.3* 8.2*   Liver Function Tests:  Recent Labs Lab 09/25/14 1221 09/27/14 0343  AST 22 13  ALT 15 11  ALKPHOS 80 64  BILITOT 0.5 0.5  PROT 5.2* 4.4*  ALBUMIN 2.5* 2.0*   CBC:  Recent Labs Lab 09/25/14 1221 09/25/14 1227 09/26/14 0455 09/27/14 0343  WBC 12.8*  --  11.2* 13.8*  NEUTROABS 10.6*  --   --   --   HGB 9.8* 10.2* 9.2* 8.4*  HCT 29.2* 30.0* 28.3* 25.2*  MCV 91.3  --  92.8 94.0  PLT 241  --  273 274   Cardiac Enzymes:  Recent Labs Lab 09/25/14 2011  CKTOTAL 91   BNP (last 3 results)  Recent Labs  09/15/14 1320 09/17/14 0455  PROBNP 2151.0* 642.5*    Recent Results (from the past 240 hour(s))  MRSA PCR Screening     Status: None   Collection Time: 09/25/14  4:49 PM  Result Value Ref Range Status   MRSA by PCR NEGATIVE NEGATIVE Final    Comment:         The GeneXpert MRSA Assay (FDA approved for NASAL specimens only), is one component of a comprehensive MRSA colonization surveillance program. It is not intended to diagnose MRSA infection nor to guide or monitor treatment for MRSA infections.      Studies:  Recent x-ray studies have been reviewed in detail by the Attending Physician  Time spent :  [place], ANP [facility] Office  [phone] Pager [phone]  On-Call/Text Page:      [email or web address]      password [email or web address]  If 7PM-7AM, please contact night-coverage [email or web address] Password [email or web address] 09/27/2014, 1:57 PM   LOS: 2 days  Examined Patient and discussed A&P with ANP Ebony Hail and agree with above plan.  I have reviewed the entire database. I have made any necessary editorial changes, and agree with its content. Pt with Multiple Complex medical problems> 35 min spent in direct Pt care   Dia Crawford, MD  [facility] [phone] pager

## 2014-09-27 NOTE — Progress Notes (Signed)
  Radiation Oncology         [phone] ________________________________  Name: [name] [name]  MRN: [id number]  Date: 09/25/2014  DOB: 20-Dec-1945  Chart Note:  I reviewed this patient's chart and MRI at the request of Dr. Annette Stable.  He is a 68 yo gentleman with history of esophageal cancer who now presents with seizure and right hemiparesis.  MRI brain shows a 1.3 x 1.6 x 2.0 cm heterogeneously enhancing mass within the high posterior left frontal region, most concerning for metastatic disease given the history of esophageal carcinoma.    On preliminary case review, I agree that the patient may be a candidate for pre-op SRS and resection of the brain metastasis.  I will see the patient in consultation later today if he remains hospitalized.  ________________________________  Sheral Apley Tammi Klippel, M.D.

## 2014-09-27 NOTE — Progress Notes (Signed)
The patient is much more awake and appropriate this morning. He denies headaches. He's had no further seizures overnight area  He is afebrile. His vitals are stable. He is awake and aware. Speech is reasonably fluent. His content is appropriate. Judgment and insight appear intact.  He still has a significant right-sided hemiparesis left-sided motor function is intact.  I discussed the situation with the patient. I informed him of his newly discovered metastatic lesion in his left posterior frontal lobe. I discussed treatment options including whole brain radiation, stereotactic radiosurgery alone, stereotactic radiosurgery coupled with surgical resection and nonoperative management. The patient would like to proceed with preoperative radiosurgery followed by operative resection. I will discussed situation with a radiation oncology department and work towards making a plan to get this done early next week. Patient may be mobilized as tolerated. Continue IV steroids and antiepileptic medications.

## 2014-09-27 NOTE — Progress Notes (Signed)
NEURO HOSPITALIST PROGRESS NOTE   SUBJECTIVE:                                                                                                                        Resting comfortably in bed and answering questions appropriately. Offers no new neurological complains. Right focal motor seizures better controlled on vimpat + keppra. Exhibits no evidence of seizures at this moment. On decadron.   OBJECTIVE:                                                                                                                           Vital signs in last 24 hours: Temp:  [98.2 F (36.8 C)-98.5 F (36.9 C)] 98.5 F (36.9 C) (12/29 0742) Pulse Rate:  [66-78] 76 (12/29 0742) Resp:  [0-21] 14 (12/29 0742) BP: (90-108)/(56-66) 101/65 mmHg (12/29 0742) SpO2:  [99 %-100 %] 100 % (12/29 0742)  Intake/Output from previous day: 12/28 0701 - 12/29 0700 In: 2725 [I.V.:2625; IV Piggyback:100] Out: 1800 [Urine:1800] Intake/Output this shift:   Nutritional status: Diet NPO time specified  Past Medical History  Diagnosis Date  . GERD (gastroesophageal reflux disease)   . Hyperlipemia   . Barrett's esophagus 12/2013  . History of radiation therapy 02/23/14- 04/04/14    esophagus 5040 cGy 28 sessions  . 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 Hypertension   Physical exam: pleasant male in no apparent distress. Head: normocephalic. Neck: supple, no bruits, no JVD. Cardiac: no murmurs. Lungs: clear. Abdomen: soft, no tender, no mass. Extremities: bilateral LE edema.  Neurologic Exam:  Mental Status: Patient is awake, alert, oriented to person, place, month, year, and situation. Comprehension, naming, and repetition intact. No dysarthria or dysphasia. Cranial Nerves: II: Visual Fields are full. Pupils are equal, round, and reactive to light.  III,IV, VI: EOMI without ptosis or diploplia.  V: Facial sensation is symmetric to temperature VII: Face is  symmetric VIII: hearing is intact to voice X: Uvula elevates symmetrically XI: Shoulder shrug is symmetric. XII: tongue is midline without atrophy or fasciculations.  Motor: Tone is normal. Bulk is normal. 5/5 strength was present on the left, on the right he has significant weakness. Sensory: Sensation is decreased on the right side Deep  Tendon Reflexes: 2+ and symmetric in the biceps and patellae  Lab Results: No results found for: CHOL Lipid Panel No results for input(s): CHOL, TRIG, HDL, CHOLHDL, VLDL, LDLCALC in the last 72 hours.  Studies/Results: Ct Head Wo Contrast  09/25/2014   CLINICAL DATA:  68 year old with code stroke. Right-sided weakness. History of esophageal cancer.  EXAM: CT HEAD WITHOUT CONTRAST  TECHNIQUE: Contiguous axial images were obtained from the base of the skull through the vertex without contrast.  COMPARISON:  None  FINDINGS: There is focal low density in the posterior left frontal lobe which is consistent with vasogenic edema and there appears to be an underlying lesion in this area. The lesion roughly measures 1.4 cm but poorly characterized on this non contrast examination. Findings are concerning for metastatic disease based on the history of esophagus cancer. There is no significant midline shift. There is no evidence for acute hemorrhage, hydrocephalus or definite infarct.  No sinuses are clear.  No acute bone abnormality.  IMPRESSION: There is a focus of vasogenic edema in the left frontal lobe. Findings are concerning for a brain lesion and likely represent metastatic disease. There is no evidence for acute hemorrhage, hydrocephalus or midline shift. Recommend further characterization with MRI.  Critical Value/emergent results were called by telephone at the time of interpretation on 09/25/2014 at 12:48 pm to Dr. Leonel Ramsay, who verbally acknowledged these results.   Electronically Signed   By: Markus Daft M.D.   On: 09/25/2014 12:51   Mr Jeri Cos OM  Contrast  09/26/2014   CLINICAL DATA:  New onset seizure. History of esophageal cancer. Initial encounter.  EXAM: MRI HEAD WITHOUT AND WITH CONTRAST  TECHNIQUE: Multiplanar, multiecho pulse sequences of the brain and surrounding structures were obtained without and with intravenous contrast.  CONTRAST:  65mL MULTIHANCE GADOBENATE DIMEGLUMINE 529 MG/ML IV SOLN  COMPARISON:  Prior CT from earlier the same day.  FINDINGS: There is an irregular enhancing mass that measures 1.3 x 1.6 x 2.0 cm within the high left posterior left frontal region (series 14, image 19). Heterogeneous T2 signal intensity with restricted diffusion intensity within this lesion suggests central necrosis. There There is associated vasogenic edema within the left frontoparietal region. Finding concerning for possible metastatic disease. No other lesions identified within the brain. No other abnormal enhancement.  Mild T2/FLAIR hyperintensity within the periventricular and deep white matter both cerebral hemispheres noted, nonspecific, but likely related to mild chronic small vessel ischemic disease.  No abnormal foci of restricted diffusion to suggest acute intracranial infarct identified. Gray-white matter differentiation otherwise maintained. Normal intravascular flow voids present.  There is no midline shift. No hydrocephalus. No extra-axial fluid collection.  Craniocervical junction is within normal limits. Scattered degenerative changes present within the visualized upper cervical spine. There appears to be associated a least moderate to severe central canal stenosis at the C3-4 level, incompletely evaluated on this exam (series 2, image 13).  Pituitary gland within normal limits.  No acute abnormality seen about the orbits.  Paranasal sinuses and mastoid air cells are clear.  No acute abnormalities cm within the scalp soft tissues.  IMPRESSION: 1. 1.3 x 1.6 x 2.0 cm heterogeneously enhancing mass within the high posterior left frontal  region, most concerning for metastatic disease given the history of esophageal carcinoma. No other intracranial lesions or abnormal enhancement identified. 2. No other acute intracranial process. 3. Degenerative changes within the upper cervical spine with associated severe canal stenosis at C3-4, incompletely evaluated on this exam. Further evaluation  with MRI the cervical spine could be performed as clinically desired.   Electronically Signed   By: Jeannine Boga M.D.   On: 09/26/2014 00:44   Dg Chest Port 1 View  09/25/2014   CLINICAL DATA:  Seizures.  EXAM: PORTABLE CHEST - 1 VIEW  COMPARISON:  09/14/2014  FINDINGS: Right IJ Port-A-Cath is unchanged with tip in the region of the cavoatrial junction. Lungs are adequately inflated with stable focal opacification over the right midlung which may represent fluid within the fissure. Hazy left base opacification as cannot exclude small effusion with atelectasis versus infection. Mild stable cardiomegaly. Remainder the exam is unchanged.  IMPRESSION: Left base opacification which may represent a small effusion with atelectasis although cannot exclude infection. Stable focal opacification over the right mid lung likely fluid within the fissure.  Stable cardiomegaly.   Electronically Signed   By: Marin Olp M.D.   On: 09/25/2014 13:59    MEDICATIONS                                                                                                                        Scheduled: . dexamethasone  4 mg Intravenous Q6H  . enoxaparin (LOVENOX) injection  85 mg Subcutaneous Q12H  . lacosamide (VIMPAT) IV  100 mg Intravenous Q12H  . levETIRAcetam  1,500 mg Intravenous Q12H  . lidocaine-prilocaine   Topical Once  . pantoprazole (PROTONIX) IV  40 mg Intravenous Q24H  . tiotropium  18 mcg Inhalation Daily    ASSESSMENT/PLAN:                                                                                                            68 year old male with  esophageal cancer admitted with likely metastatic brain cancer and focal status epilepticus. Seizures better controlled now on IV keppra+ IV vimpat. Neurosurgery input greatly appreciated. Continue decadron and current AED regimen. Will follow up.  Dorian Pod, MD [facility] [phone]  09/27/2014, 7:54 AM

## 2014-09-28 ENCOUNTER — Inpatient Hospital Stay (HOSPITAL_COMMUNITY): Payer: Medicare Other

## 2014-09-28 LAB — BASIC METABOLIC PANEL
Anion gap: 5 (ref 5–15)
BUN: 22 mg/dL (ref 6–23)
CO2: 23 mmol/L (ref 19–32)
Calcium: 8.3 mg/dL — ABNORMAL LOW (ref 8.4–10.5)
Chloride: 109 mEq/L (ref 96–112)
Creatinine, Ser: 1.11 mg/dL (ref 0.50–1.35)
GFR calc Af Amer: 77 mL/min — ABNORMAL LOW (ref >90)
GFR, EST NON AFRICAN AMERICAN: 66 mL/min — AB (ref >90)
GLUCOSE: 160 mg/dL — AB (ref 70–99)
POTASSIUM: 4.6 mmol/L (ref 3.5–5.1)
Sodium: 137 mmol/L (ref 135–145)

## 2014-09-28 MED ORDER — PANTOPRAZOLE SODIUM 40 MG PO TBEC
40.0000 mg | DELAYED_RELEASE_TABLET | Freq: Every day | ORAL | Status: DC
  Administered 2014-09-28 – 2014-10-10 (×13): 40 mg via ORAL
  Filled 2014-09-28 (×16): qty 1

## 2014-09-28 MED ORDER — GADOBENATE DIMEGLUMINE 529 MG/ML IV SOLN
20.0000 mL | Freq: Once | INTRAVENOUS | Status: AC | PRN
  Administered 2014-09-28: 18 mL via INTRAVENOUS

## 2014-09-28 MED ORDER — FUROSEMIDE 10 MG/ML IJ SOLN
40.0000 mg | Freq: Once | INTRAMUSCULAR | Status: AC
Start: 2014-09-28 — End: 2014-09-28
  Administered 2014-09-28: 40 mg via INTRAVENOUS
  Filled 2014-09-28: qty 4

## 2014-09-28 NOTE — Progress Notes (Signed)
Patient came to our floor at around 1205 via ambulance.Alert and oriented to place. Flat affect.In the patient's orders when I released it it showed cardiac monitoring,so I clarified the order with the Hospitalist and spoke with Pearson Grippe and said we have to transfer the patient to stepdown, after a while I got another call from [place]. And she ordered patient to be transferred to telemetry, bed management notified. Awaiting for bed number to call report. Patient denies pain at this time,no seizures noted.Sandie Ano RN

## 2014-09-28 NOTE — Progress Notes (Signed)
Patient transferred to [facility] on transfer.Sandie Ano RN

## 2014-09-28 NOTE — Progress Notes (Signed)
OT Cancellation Note  Patient Details Name: Bauer Ausborn MRN: [id number] DOB: 26-Nov-1945   Cancelled Treatment:    Reason Eval/Treat Not Completed: Pt transferred to [facility].  OT will reattempt.   Darlina Rumpf [place], OTR/L [phone]  09/28/2014, 12:14 PM

## 2014-09-28 NOTE — Progress Notes (Addendum)
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 Currently on chemotherapy followed by Dr. Earlie [name]. Recently diagnosed with DVT on 09/02/14 and started initially on Lovenox w/ transition to Xarelto. Recent admission to the hospital for diastolic dysfunction and was discharged on 12/21. At skilled nursing facility noted to have focal seizure activity involving right arm. Transported via EMS. En route noted to have generalized tonic-clonic seizure that responded to IV Ativan. Upon arrival to ER continued right facial and upper extremity twitching. Given IV Keppra and Dilantin. CT head concerning for brain lesion likely metastatic but no evidence of hemorrhage, hydrocephalus, or midline shift. Neurology evaluated the patient. MRI ordered.  Since admission MRI confirmed left frontal region mass measuring 1.3 x 1.6 x 2 cm. Started on IV Decadron. Neurosurgery and Oncology consulted.  Radiation Oncology/Dr. Tammi [name] evaluated; MRI Brain completed 12/30 and to begin [facility] 12/31. Right side hemiparesis persists and Neurosurgery suspects Todd's paralysis. Recent issues with congestion and mild orthopnea which delayed MRI on 12/29; CXR with edema so 1x IV Lasix given 12/30.  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 -New finding - presented w/ seizure activity; now that seizures have resolved pt with residual dense right arm hemiplegia, right facial droop, and tongue deviation to left - Neurosurgery rec stereotactic radiosurgery coupled with surgical resection - continue IV Decadron - Oncology following/await surgical bx and subsequent pathology - rad  oncology consulted - PT/OT following - Brain MRI completed 12/30 - transfer to [facility] for oncology focused treatment  Stage IIIB esophageal cancer with metastases -As above - cont IVF - SLP rec D2 diet with thin liquids  Focal motor seizure and grand mal seizure -Appreciate Neurology assistance - continue IV Keppra; Vimpat added 12/28 due to ongoing seizure activity -no further seizures past 12 hours  Acute respiratory failure with hypoxia/pulmomary edema on CXR (12/30) -Directly related to altered mentation and recurrent seizures - continue supportive care with oxygen - monitor for signs of aspiration - issues with congestion and orthopnea 12/29; IVFs to kvo and Lasix 40 mg IV x 1 dose 12/30  Hypertension -Blood pressure remains soft so metoprolol, valsartan and hydrochlorothiazide on hold  Hyperlipemia  DVT of lower extremity  -New diagnosis 12/4 - anticoagulation w/ lovenox (recent study supports it's safe use in this clinical scenario: Intracranial hemorrhage in patients with brain metastases treated with therapeutic enoxaparin: a matched cohort study - [name] [name], [name] [name], [name] [name]. [name] [name], [name] [name], [name] [name] Blood Jan 2015, DOI: [id number])  BPH   Parox A-fib -Rate controlled  Severe protein-calorie malnutrition -On Megace prior to admission  DVT prophylaxis: lovenox  Code Status: Full Family Communication: Spoke via phone to grabd-daughter [name] [name] ([place]) to update on status and plans to tx to [facility] today Disposition Plan/Expected LOS: transfer to [facility]- too debilitated to dc to SNF and needs to remain IP until begins and tolerates SRS treatments - cont PT/OT/SLP  Consultants: Neurology/Dr. Aram [name] Neurosurgery/Dr. Pool Oncology/[name] [name] onco/Dr. Tammi [name]  Procedures: 2D ECHO: - Left ventricle: The cavity size was normal. Wall thickness was normal. Systolic function was normal. The  estimated ejection fraction was in the range of 60% to 65%. Wall motion was  normal; there were no regional wall motion abnormalities. - Atrial septum: No defect or patent foramen ovale was identified. - Pulmonary arteries: PA peak pressure: 33 mm Hg (S).  Antibiotics: None  Objective: Blood pressure 111/74, pulse 103, temperature 97.7 F (36.5 C), temperature source Oral, resp. rate 23, height 5\' 5"  (1.651 m), weight 189 lb 13.1 oz (86.1 kg), SpO2 100 %.  Intake/Output Summary (Last 24 hours) at 09/28/14 0956 Last data filed at 09/28/14 0600  Gross per 24 hour  Intake   3330 ml  Output    700 ml  Net   2630 ml    Exam: Gen: No acute respiratory distress  Neurological: Dense RUE hemiplegia, right facial droop and tongue deviates to left (unchanged); o/w neuro intact Chest: Clear to auscultation bilaterally except for new bibasilar fine exp crackles, 2 L Cardiac: Regular rate and rhythm, S1-S2, no rubs murmurs or gallops Abdomen: Soft nontender nondistended without obvious hepatosplenomegaly, no ascites Extremities: Symmetrical without edema, clubbing or cyanosis   Scheduled Meds:  Scheduled Meds: . dexamethasone  4 mg Intravenous Q6H  . enoxaparin (LOVENOX) injection  85 mg Subcutaneous Q12H  . feeding supplement (ENSURE COMPLETE)  237 mL Oral BID BM  . feeding supplement (PRO-STAT SUGAR FREE 64)  30 mL Oral BID WC  . furosemide  40 mg Intravenous Once  . lacosamide (VIMPAT) IV  100 mg Intravenous Q12H  . levETIRAcetam  1,500 mg Intravenous Q12H  . pantoprazole (PROTONIX) IV  40 mg Intravenous Q24H  . tiotropium  18 mcg Inhalation Daily    Data Reviewed: Basic Metabolic Panel:  Recent Labs Lab 09/25/14 1221 09/25/14 1227 09/26/14 0455 09/27/14 0343 09/28/14 0314  NA 135 137 134* 135 137  K 4.2 4.1 5.0 4.5 4.6  CL 108 104 105 107 109  CO2 18*  --  24 21 23   GLUCOSE 108* 107* 117* 120* 160*  BUN 15 17 15 18 22   CREATININE 1.21 1.20 1.17 1.16 1.11  CALCIUM  8.7  --  8.3* 8.2* 8.3*   Liver Function Tests:  Recent Labs Lab 09/25/14 1221 09/27/14 0343  AST 22 13  ALT 15 11  ALKPHOS 80 64  BILITOT 0.5 0.5  PROT 5.2* 4.4*  ALBUMIN 2.5* 2.0*   CBC:  Recent Labs Lab 09/25/14 1221 09/25/14 1227 09/26/14 0455 09/27/14 0343  WBC 12.8*  --  11.2* 13.8*  NEUTROABS 10.6*  --   --   --   HGB 9.8* 10.2* 9.2* 8.4*  HCT 29.2* 30.0* 28.3* 25.2*  MCV 91.3  --  92.8 94.0  PLT 241  --  273 274   Cardiac Enzymes:  Recent Labs Lab 09/25/14 2011  CKTOTAL 91   BNP (last 3 results)  Recent Labs  09/15/14 1320 09/17/14 0455  PROBNP 2151.0* 642.5*    Recent Results (from the past 240 hour(s))  MRSA PCR Screening     Status: None   Collection Time: 09/25/14  4:49 PM  Result Value Ref Range Status   MRSA by PCR NEGATIVE NEGATIVE Final    Comment:        The GeneXpert MRSA Assay (FDA approved for NASAL specimens only), is one component of a comprehensive MRSA colonization surveillance program. It is not intended to diagnose MRSA infection nor to guide or monitor treatment for MRSA infections.      Studies:  Recent x-ray studies have been reviewed in detail by the Attending Physician  Time spent :  35 mins  Erin Hearing, ANP [facility] Office  [phone] Pager [phone]  On-Call/Text Page:      [email or web address]      password [email or web address]  If 7PM-7AM, please contact night-coverage [email or web address] Password [email or web address] 09/28/2014, 9:56 AM   LOS: 3 days   I have personally examined this patient and reviewed the entire database. I have reviewed the above [name], made any necessary editorial changes, and agree with its content.  Cherene Altes, MD [facility]

## 2014-09-28 NOTE — Progress Notes (Signed)
No new events or problems. 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 Currently patient getting planning MRI scan.  Plan for [facility] treatment 1/5 with surgical resection 1/7.

## 2014-09-28 NOTE — Progress Notes (Signed)
Report called to [facility]. Butch Penny received report. [facility] notified of transfer. Granddaughter (POA) Ms. Vicie Mutters notified of transfer as well.

## 2014-09-28 NOTE — Progress Notes (Signed)
PT Cancellation Note  Patient Details Name: [name] [name] MRN: [id number] DOB: 09-Jan-1946   Cancelled Treatment:    Reason Eval/Treat Not Completed: Other (comment). Pt being transferred to [facility] upon PT arrival. PT eval to be completed at [facility] when able.   Kingsley Callander 09/28/2014, 11:40 AM   Kittie Plater, PT, DPT Pager #: [phone] Office #: [phone]

## 2014-09-28 NOTE — Progress Notes (Signed)
Report rendered to [facility], patient will be transferred to [facility] is stable at this time.Sandie Ano RN

## 2014-09-28 NOTE — Progress Notes (Signed)
  Radiation Oncology         [phone] ________________________________  Name: [name] [name]  MRN: [id number]  Date: 09/29/2014  DOB: [birth year]  INPATIENT  SIMULATION AND TREATMENT PLANNING NOTE  DIAGNOSIS:  68 year old gentleman with a solitary 2 cm left frontal brain metastasis from metastatic esophageal cancer  NARRATIVE:  The patient was brought to the [facility].  Identity was confirmed.  All relevant records and images related to the planned course of therapy were reviewed.  The patient freely provided informed written consent to proceed with treatment after reviewing the details related to the planned course of therapy. The consent form was witnessed and verified by the simulation staff. Intravenous access was established for contrast administration. Then, the patient was set-up in a stable reproducible supine position for radiation therapy.  A relocatable thermoplastic stereotactic head frame was fabricated for precise immobilization.  CT images were obtained.  Surface markings were placed.  The CT images were loaded into the planning software and fused with the patient's targeting MRI scan.  Then the target and avoidance structures were contoured.  Treatment planning then occurred.  The radiation prescription was entered and confirmed.  I have requested 3D planning  I have requested a DVH of the following structures: Brain stem, brain, left eye, right eye, lenses, optic chiasm, target volumes, uninvolved brain, and normal tissue.    PLAN:  The patient will receive 15 Gy in one fraction pre-operatively.  ________________________________  Sheral Apley Tammi Klippel, M.D.

## 2014-09-28 NOTE — Progress Notes (Signed)
Notes reviewed. [name] w/rad onco plans to begin stereotactic rad tx 12/31 at [facility] so order placed for transfer. Will need to complete MRI Brain prior to transport if possible-attempted last pm but cancelled due to pt secretions and concern for aspiration. Because of secretions will KVO fluids and ck PCXR.  Erin Hearing, ANP

## 2014-09-28 NOTE — Progress Notes (Signed)
NEURO HOSPITALIST PROGRESS NOTE   SUBJECTIVE:                                                                                                                        Uneventful night without further seizures. Couldn't have MRI brain last night. On IV keppra and IV vimpat. Decadron.  OBJECTIVE:                                                                                                                           Vital signs in last 24 hours: Temp:  [97.6 F (36.4 C)-97.8 F (36.6 C)] 97.7 F (36.5 C) (12/30 0400) Pulse Rate:  [62-85] 84 (12/29 2024) Resp:  [11-20] 20 (12/29 2024) BP: (94-111)/(51-74) 103/71 mmHg (12/30 0400) SpO2:  [100 %] 100 % (12/29 2024)  Intake/Output from previous day: 12/29 0701 - 12/30 0700 In: 3530 [P.O.:1080; I.V.:2300; IV Piggyback:150] Out: 700 [Urine:700] Intake/Output this shift:   Nutritional status: DIET DYS 2  Past Medical History  Diagnosis Date  . GERD (gastroesophageal reflux disease)   . Hyperlipemia   . Barrett's esophagus 12/2013  . History of radiation therapy 02/23/14- 04/04/14    esophagus 5040 cGy 28 sessions  . 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 Hypertension    Physical exam: pleasant male in no apparent distress. Head: normocephalic. Neck: supple, no bruits, no JVD. Cardiac: no murmurs. Lungs: clear. Abdomen: soft, no tender, no mass. Extremities: bilateral LE edema.  Neurologic Exam:  Mental Status: Patient is awake, alert, oriented to person, place, month, year, and situation. Comprehension, naming, and repetition intact. No dysarthria or dysphasia. Cranial Nerves: II: Visual Fields are full. Pupils are equal, round, and reactive to light.  III,IV, VI: EOMI without ptosis or diploplia.  V: Facial sensation is symmetric to temperature VII: Face is symmetric VIII: hearing is intact to voice X: Uvula elevates symmetrically XI: Shoulder shrug is symmetric. XII:  tongue is midline without atrophy or fasciculations.  Motor: Tone is normal. Bulk is normal. 5/5 strength was present on the left, dense HP on the right. Sensory: Sensation is decreased on the right side Deep Tendon Reflexes: 2+ and symmetric in the biceps and patellae  Lab Results: No results found for: CHOL  Lipid Panel No results for input(s): CHOL, TRIG, HDL, CHOLHDL, VLDL, LDLCALC in the last 72 hours.  Studies/Results: Dg Swallowing Func-speech Pathology  09/27/2014   [name], [place]     09/27/2014  2:35 PM Objective Swallowing Evaluation: Modified Barium Swallowing Study   Patient Details  Name: [name] [name] MRN: [id number] Date of Birth: April 06, 1946  Today's Date: 09/27/2014 Time: 1330-1400 SLP Time Calculation (min) (ACUTE ONLY): 30 min  Past Medical History:  Past Medical History  Diagnosis Date  . GERD (gastroesophageal reflux disease)   . Hyperlipemia   . Barrett's esophagus 12/2013  . History of radiation therapy 02/23/14- 04/04/14    esophagus 5040 cGy 28 sessions  . 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 Hypertension    Past Surgical History:  Past Surgical History  Procedure Laterality Date  . Eus N/A 02/03/2014    Procedure: UPPER ENDOSCOPIC ULTRASOUND (EUS) LINEAR;  Surgeon:  Milus Banister, MD;  Location: [facility] END[facility];  Service:  Endoscopy;  Laterality: N/A;   HPI:  [name] [name] is a 68 y.o. male with pmh of esophageal cancer  status post previous radiation treatments and ongoing  chemotherapy who presented to the emergency room and was just  discharged from the hospitalist service 6 days ago for diastolic  heart failure and frequent falls. He was brought in today by EMS  from his skilled nursing facility with some right arm and facial  twitching. There are concerns about seizure activity and while  being transported, pt had a witnessed generalized tonic-clonic  seizure lasting 3-4 minutes. A MRI scan of the head was  concerning for heterogeneously enhancing mass  within the high  posterior left frontal regionfelt to be metastatic disease from  patient's esophageal cancer (lower third), and degenerative  changes within the upper cervical spine with associated severe  canal stenosis at C3-4     Assessment / Plan / Recommendation Clinical Impression  Dysphagia Diagnosis: Mild pharyngeal phase dysphagia Clinical impression: Pt demonstrates a mild pharyngeal phase  dysphagia characterized by sensory deficits resulting in delayed  swallow initiation and silent aspiration of mixed consistencies  (thin and barium tablet). No aspiration noted across trials of  thin, puree, or regular textures when consumed individually.  Pt  has a hx of GERD, esophageal cancer, and radiation treatments.  Suspect that a lifetime of reflux may be playing a role in  reduced sensation leading to the above deficits. Recommend  dysphagia 2 (chopped) diet, due to pt's preference for softer  foods, and thin liquids. Medications to be given whole in puree.  SLP will follow briefly to ensure diet tolerance.    Treatment Recommendation  Therapy as outlined in treatment plan below    Diet Recommendation Dysphagia 2 (Fine chop);Thin liquid   Liquid Administration via: Cup;Straw Medication Administration: Whole meds with puree Supervision: Staff to assist with self feeding Compensations: Slow rate;Small sips/bites Postural Changes and/or Swallow Maneuvers: Seated upright 90  degrees;Upright 30-60 min after meal    Other  Recommendations Oral Care Recommendations: Oral care BID   Follow Up Recommendations  24 hour supervision/assistance    Frequency and Duration min 2x/week  2 weeks   Pertinent Vitals/Pain none    SLP Swallow Goals     General Date of Onset: 09/25/14 HPI: [name] [name] is a 68 y.o. male with pmh of esophageal  cancer status post previous radiation treatments and ongoing  chemotherapy who presented to the emergency room and was just  discharged from the hospitalist service  6 days ago for diastolic   heart failure and frequent falls. He was brought in today by EMS  from his skilled nursing facility with some right arm and facial  twitching. There are concerns about seizure activity and while  being transported, pt had a witnessed generalized tonic-clonic  seizure lasting 3-4 minutes. A MRI scan of the head was  concerning for heterogeneously enhancing mass within the high  posterior left frontal regionfelt to be metastatic disease from  patient's esophageal cancer (lower third), and degenerative  changes within the upper cervical spine with associated severe  canal stenosis at C3-4 Type of Study: Modified Barium Swallowing Study Reason for Referral: Objectively evaluate swallowing function Previous Swallow Assessment: BSE, 12/29 Diet Prior to this Study: Dysphagia 2 (chopped);Thin liquids Temperature Spikes Noted: No Respiratory Status: Nasal cannula History of Recent Intubation: No Behavior/Cognition: Alert;Cooperative;Pleasant mood;Requires  cueing Oral Cavity - Dentition: Adequate natural dentition Oral Motor / Sensory Function: Within functional limits Self-Feeding Abilities: Able to feed self;Needs assist Patient Positioning: Upright in chair Baseline Vocal Quality: Hoarse Volitional Cough: Strong Volitional Swallow: Able to elicit Anatomy: Within functional limits Pharyngeal Secretions: Not observed secondary MBS    Reason for Referral Objectively evaluate swallowing function   Oral Phase Oral Preparation/Oral Phase Oral Phase: WFL   Pharyngeal Phase Pharyngeal Phase Pharyngeal Phase: Impaired Pharyngeal - Thin Pharyngeal - Thin Cup: Delayed swallow initiation;Premature  spillage to pyriform sinuses Pharyngeal - Thin Straw: Delayed swallow initiation;Premature  spillage to pyriform sinuses Pharyngeal - Solids Pharyngeal - Puree: Within functional limits Pharyngeal - Regular: Within functional limits Pharyngeal - Pill: Delayed swallow initiation;Premature spillage  to valleculae;Penetration/Aspiration  before swallow Penetration/Aspiration details (pill): Material enters airway,  passes BELOW cords without attempt by patient to eject out  (silent aspiration)  Cervical Esophageal Phase    GO    Cervical Esophageal Phase Cervical Esophageal Phase: Impaired Cervical Esophageal Phase - Thin Thin Cup: Within functional limits Cervical Esophageal Phase - Solids Puree: Within functional limits Pill:  (appearance of stasis in the distal esophagus)         [name] 09/27/2014, 2:30 PM     MEDICATIONS                                                                                                                        Scheduled: . dexamethasone  4 mg Intravenous Q6H  . enoxaparin (LOVENOX) injection  85 mg Subcutaneous Q12H  . feeding supplement (ENSURE COMPLETE)  237 mL Oral BID BM  . feeding supplement (PRO-STAT SUGAR FREE 64)  30 mL Oral BID WC  . lacosamide (VIMPAT) IV  100 mg Intravenous Q12H  . levETIRAcetam  1,500 mg Intravenous Q12H  . pantoprazole (PROTONIX) IV  40 mg Intravenous Q24H  . tiotropium  18 mcg Inhalation Daily    ASSESSMENT/PLAN:  68 year old male with esophageal cancer admitted with likely metastatic brain cancer and focal status epilepticus that had resolved on IV keppra+ IV vimpat. Continue decadron and current AED regimen. Neurology will sign off.  Dorian Pod, MD [facility] [phone]  09/28/2014, 8:02 AM

## 2014-09-29 ENCOUNTER — Ambulatory Visit
Admit: 2014-09-29 | Discharge: 2014-09-29 | Disposition: A | Discharge status: Home / Self Care | Payer: Medicare Other | Attending: Radiation Oncology | Admitting: Radiation Oncology

## 2014-09-29 DIAGNOSIS — C7931 Secondary malignant neoplasm of brain: Secondary | ICD-10-CM

## 2014-09-29 NOTE — Progress Notes (Signed)
Speech Language Pathology Treatment: Dysphagia  Patient Details Name: [name] [name] MRN: [id number] DOB: 1946-06-13 Today's Date: 09/29/2014 Time: 4431-5400 SLP Time Calculation (min) (ACUTE ONLY): 14 min  Assessment / Plan / Recommendation Clinical Impression  Performance with pos appears consistent with MBS results. Oropharyngeal phase of swallow appears WFL without overt s/s of aspiration. Min verbal cueing for small bites provided due to mild impulsivity. Education complete with patient and niece regarding previous recommendations for no mixed consistencies to decrease risk of aspiration. Both verbalized understanding. No further SLP needs indicated at this time. Please reconsult if needed.    HPI HPI: [name] [name] is a 68 y.o. male with pmh of esophageal cancer status post previous radiation treatments and ongoing chemotherapy who presented to the emergency room and was just discharged from the hospitalist service 6 days ago for diastolic heart failure and frequent falls. He was brought in today by EMS from his skilled nursing facility with some right arm and facial twitching. There are concerns about seizure activity and while being transported, pt had a witnessed generalized tonic-clonic seizure lasting 3-4 minutes. A MRI scan of the head was concerning for heterogeneously enhancing mass within the high posterior left frontal regionfelt to be metastatic disease from patient's esophageal cancer (lower third), and degenerative changes within the upper cervical spine with associated severe canal stenosis at C3-4   Pertinent Vitals Pain Assessment: No/denies pain  SLP Plan  All goals met    Recommendations Diet recommendations: Regular;Thin liquid Liquids provided via: Cup;Straw Medication Administration: Whole meds with puree Supervision: Staff to assist with self feeding Compensations: Slow rate;Small sips/bites Postural Changes and/or Swallow Maneuvers: Seated upright 90 degrees;Upright  30-60 min after meal (avoid mixed consistencies)              Oral Care Recommendations: Oral care BID Follow up Recommendations: 24 hour supervision/assistance Plan: All goals met    [place], CCC-SLP [phone]   [name] [name] 09/29/2014, 2:37 PM

## 2014-09-29 NOTE — Progress Notes (Addendum)
ANTICOAGULATION CONSULT NOTE - Follow Up Consult  Pharmacy Consult for Heparin (while Xarelto on hold)> transitioned to enoxaparin 12/28. Indication: hx DVT  No Known Allergies  Patient Measurements: Height: 5\' 7"  (170.2 cm) Weight: 189 lb (85.73 kg) IBW/kg (Calculated) : 66.1  Vital Signs: Temp: 98 F (36.7 C) (12/31 0452) Temp Source: Oral (12/31 0900) BP: 114/64 mmHg (12/31 0900) Pulse Rate: 83 (12/31 0900)  Labs:  Recent Labs  09/27/14 0343 09/28/14 0314  HGB 8.4*  --   HCT 25.2*  --   PLT 274  --   CREATININE 1.16 1.11    Estimated Creatinine Clearance: 66.6 mL/min (by C-G formula based on Cr of 1.11).   Assessment: 68 yo M admitted 09/25/2014 with seizure d/t brain mets with h/o metastatic esophageal cancer.  Pharmacy consulted to dose anticoagulation for recent DVT (dx 09/02/14), on xarelto prior to admission then transitioned to heparin gtt to [place] at [facility].  Transferred to [facility] for stereotatic radiation therapy followed by possible neurosurgery.  Today, 09/29/2014  CBC: 12/29 - Hgb = 8.4 stable with previous values, pltc WNL  Renal function: SCr WNL with CrCl > 42ml/min  Current weight = 85.7kg  Goal of Therapy:  4hr LMWH anti-Xa level 0.6-1.0 CBC q72h Monitor platelets by anticoagulation protocol: Yes   Plan:  -Continue Enoxaparin 85 mg SQ q12h -Follow CBC (q72h) and SCr, adjust as indicated. -Monitor for bleeding  Doreene Eland, PharmD, BCPS.   Pager: [phone] 09/29/2014 11:14 AM

## 2014-09-29 NOTE — Evaluation (Signed)
Physical Therapy Evaluation Patient Details Name: [name] [name] MRN: [id number] DOB: 29-Mar-1946 Today's Date: 09/29/2014   History of Present Illness  68 yo male admitted with acute R sided weakness, fall, seizures, progressive L frontal brain mets. Hx of esophageal cancer-undergoing chemotherapy, emphysema, DVT R LE, heart failure, HTN. Pt was at [facility] for rehab.   Clinical Impression  On eval, pt required Mod assist +2 for bed mobility and Min assist for static and dynamic sitting balance. R side weakness > L side (R UE flaccidity). Pt fatigues very quickly. Assisted back to supine then performed exercises. Pt states he would like to return to SNF to continue rehab. Recommend SNF.     Follow Up Recommendations SNF;Supervision/Assistance - 24 hour    Equipment Recommendations  None recommended by PT    Recommendations for Other Services OT consult     Precautions / Restrictions Precautions Precautions: Fall Restrictions Weight Bearing Restrictions: No      Mobility  Bed Mobility Overal bed mobility: Needs Assistance Bed Mobility: Supine to Sit;Sit to Supine     Supine to sit: HOB elevated;+2 for safety/equipment;Max assist Sit to supine: HOB elevated;Mod assist;+2 for physical assistance;+2 for safety/equipment   General bed mobility comments: Assist for trunk and bil LEs. Increased time. Utilized bedpad for scooting, positioning.   Transfers                 General transfer comment: NT-worked on static and dynamic sitting balance  Ambulation/Gait                Stairs            Wheelchair Mobility    Modified Rankin (Stroke Patients Only)       Balance Overall balance assessment: Needs assistance Sitting-balance support: Single extremity supported;Feet supported Sitting balance-Leahy Scale: Poor Sitting balance - Comments: Min assist for static sitting balance even with 1 UE support. Multimodal cueing for self correction. Reached with L UE  x 5- pt with difficulty with weakness-dropped bottle 3/5 times. Facilitation with anterior weightshifting of trunk and elevation of L UE.  Postural control: Right lateral lean                                   Pertinent Vitals/Pain Pain Assessment: No/denies pain    Home Living Family/patient expects to be discharged to:: [place]: [name]      Prior Function           Comments: pt reports he was working with physical therapy/ambulating at SNF     Hand Dominance        Extremity/Trunk Assessment   Upper Extremity Assessment: Defer to OT evaluation           Lower Extremity Assessment: RLE deficits/detail;LLE deficits/detail RLE Deficits / Details: PF 2/5, DF 1/5, hip flex 2/5, hip abd 2/5 LLE Deficits / Details: hip flex 3-/5, hip abd/add at least 2/5, DF/PF 3/5  Cervical / Trunk Assessment: Kyphotic  Communication   Communication: No difficulties  Cognition Arousal/Alertness: Awake/alert Behavior During Therapy: Flat affect Overall Cognitive Status: Impaired/Different from baseline Area of Impairment: Following commands       Following Commands: Follows one step commands with increased time            General Comments  Exercises General Exercises - Lower Extremity Ankle Circles/Pumps: AAROM;Both;5 reps;Supine Quad Sets: AROM;Both;5 reps;Supine Heel Slides: AAROM;Both;5 reps;Supine Hip ABduction/ADduction: AAROM;Both;5 reps;Supine      Assessment/Plan    PT Assessment Patient needs continued PT services  PT Diagnosis Generalized weakness;Hemiplegia dominant side (R UE flaccidity, R LE hemiparesis)   PT Problem List Decreased strength;Decreased range of motion;Decreased activity tolerance;Decreased balance;Decreased mobility;Decreased knowledge of use of DME;Decreased coordination  PT Treatment Interventions Functional mobility training;Therapeutic  activities;Therapeutic exercise;Patient/family education;Balance training;Neuromuscular re-education   PT Goals (Current goals can be found in the Care Plan section) Acute Rehab PT Goals Patient Stated Goal: to return to SNF to continue rehab PT Goal Formulation: With patient Time For Goal Achievement: 10/13/14 Potential to Achieve Goals: Fair    Frequency Min 3X/week   Barriers to discharge        Co-evaluation               End of Session   Activity Tolerance: Patient limited by fatigue Patient left: in bed;with call bell/phone within reach;with family/visitor present           Time: 8616-8372 PT Time Calculation (min) (ACUTE ONLY): 23 min   Charges:   PT Evaluation $Initial PT Evaluation Tier I: 1 Procedure PT Treatments $Therapeutic Exercise: 8-22 mins $Therapeutic Activity: 8-22 mins   PT G Codes:        Weston Anna, MPT Pager: [phone]

## 2014-09-29 NOTE — Evaluation (Signed)
Occupational Therapy Evaluation Patient Details Name: [name] [name] MRN: [id number] DOB: 07-20-1946 Today's Date: 09/29/2014    History of Present Illness 68 yo male admitted with acute R sided weakness, fall, seizures, progressive L frontal brain mets. Hx of esophageal cancer-undergoing chemotherapy, emphysema, DVT R LE, heart failure, HTN. Pt was at [facility] for rehab.    Clinical Impression   Pt admitted with R sided weakness. Pt currently with functional limitations due to the deficits listed below (see OT Problem List).  Pt will benefit from skilled OT to increase their safety and independence with ADL and functional mobility for ADL to facilitate discharge to venue listed below.      Follow Up Recommendations  SNF    Equipment Recommendations  None recommended by OT       Precautions / Restrictions Precautions Precautions: Fall Restrictions Weight Bearing Restrictions: No              ADL   Eating/Feeding: Minimal assistance;Bed level (with LUE)                                     General ADL Comments: pt declined OOB               Pertinent Vitals/Pain Pain Assessment: No/denies pain     Hand Dominance     Extremity/Trunk Assessment Upper Extremity Assessment Upper Extremity Assessment: RUE deficits/detail RUE Deficits / Details: RUE trace movement.  Edema noted in hand.  Educated pt and wife to prop LUE on pilllow   Lower Extremity Assessment Lower Extremity Assessment: RLE deficits/detail;LLE deficits/detail RLE Deficits / Details: PF 2/5, DF 1/5, hip flex 2/5, hip abd 2/5 LLE Deficits / Details: hip flex 3-/5, hip abd/add at least 2/5, DF/PF 3/5   Cervical / Trunk Assessment Cervical / Trunk Assessment: Kyphotic   Communication Communication Communication: No difficulties   Cognition Arousal/Alertness: Awake/alert Behavior During Therapy: Flat affect Overall Cognitive Status: Impaired/Different from baseline Area of Impairment:  Following commands       Following Commands: Follows one step commands with increased time           General Comments   pt dedclined OOB as he has already been up with PT earlier and was fatigued            Home Living Family/patient expects to be discharged to:: [place] [facility]          Prior Functioning/Environment          Comments: pt reports he was working with physical therapy/ambulating at SNF    OT Diagnosis: Generalized weakness;Hemiplegia dominant side   OT Problem List: Decreased strength;Decreased range of motion;Decreased activity tolerance;Impaired balance (sitting and/or standing);Impaired UE functional use   OT Treatment/Interventions: Self-care/ADL training;DME and/or AE instruction;Patient/family education;Neuromuscular education    OT Goals(Current goals can be found in the care plan section) Acute Rehab OT Goals Patient Stated Goal: get this arm stronger OT Goal Formulation: With patient Time For Goal Achievement: 10/06/14 Potential to Achieve Goals: Fair  OT Frequency: Min 2X/week   Barriers to D/C:               End of Session Nurse Communication: Mobility status  Activity Tolerance: Patient limited by  fatigue Patient left: in bed;with family/visitor present   Time: 3235-5732 OT Time Calculation (min): 17 min Charges:  OT General Charges $OT Visit: 1 Procedure OT Evaluation $Initial OT Evaluation Tier I: 1 Procedure OT Treatments $Self Care/Home Management : 8-22 mins G-Codes:    Payton Mccallum D 10/12/14, 1:28 PM

## 2014-09-29 NOTE — Progress Notes (Signed)
CSW following for return to [facility] when medically ready. CSW has completed FL2 & will continue to follow and assist with return.    Raynaldo Opitz, [facility] Clinical Social Worker cell #: [phone]

## 2014-09-29 NOTE — Progress Notes (Signed)
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 Earlie [name]. He was  diagnosed with DVT on 09/02/14 and started initially on Lovenox w/ transition to Xarelto. Patient also recently admission to the hospital for diastolic dysfunction and was discharged on 12/21. At skilled nursing facility noted to have focal seizure  involving right arm. Transported via EMS. En route noted to have generalized tonic-clonic seizure that responded to IV Ativan. Upon arrival to ER continued to have right facial and upper extremity twitching. Given IV Keppra and Dilantin. CT head concerning for brain lesion likely metastatic but no evidence of hemorrhage, hydrocephalus, or midline shift. Neurology evaluated the patient.   MRI confirmed left frontal region mass measuring 1.3 x 1.6 x 2 cm. Started on IV Decadron. Neurosurgery and Oncology consulted. Radiation Oncology [name] [name] evaluated; MRI Brain completed 12/30 and to begin [facility] on 12/31. Right side hemiparesis persists and Neurosurgery suspects Todd's paralysis. Pt complained of congestion and mild orthopnea . CXR with edema so 1x IV Lasix given 12/30. Transferred to [facility] on 12/30 for radiation.    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  presented with generalized seizures. Has  residual dense right arm hemiplegia, right facial droop, and tongue deviation to left.  -MRI brain shows Progression/enlargement of the left posterior frontal cortical lesion, increased in size and showing more vasogenic edema due to rapidly progressive metastatic lesion.   - Neurosurgery ( [name] [name]) recommends  stereotactic radiosurgery coupled with surgical resection ( planned for  1/7)  continue IV Decadron - Oncology following/await surgical bx and subsequent pathology  - rad oncology consulted  - PT/OT following . Recommend SNF.  Stage IIIB esophageal cancer with metastases Followed by [name] [name] [name]  - cont IVF - SLP rec D2 diet with thin liquids  Focal motor seizure and grand mal seizure -Appreciate Neurology recommendations  - continue IV Keppra; Vimpat added 12/28 due to ongoing seizure activity  -no further seizures for past 48 hrs  Acute respiratory failure with hypoxia/pulmomary edema on CXR (12/30) -Secondary to  altered mentation and recurrent seizures  - continue supportive care with oxygen  Prn lasix for diuresis  Hypertension - metoprolol, valsartan and hydrochlorothiazide on hold due to soft BP  Hyperlipemia  DVT of lower extremity  -New diagnosis 12/4. Anticoagulation with lovenox   BPH   Paroxysmal  A-fib -Rate controlled  Severe protein-calorie malnutrition -On Megace prior to admission. Added supplements   DVT prophylaxis: therapeutic lovenox   Diet: dys level 2  Consultants: Neurology/[name] [name] Neurosurgery/[name] [name] Oncology/[name] [name] onco/[name] [name]  Procedure:  MRI brain MBS  Code Status: Full  Family Communication: none at bedside   Disposition Plan: PT recommends SNF. possibly next week     HPI/Subjective: Still has Rt sided weakness. Feels tired  Objective: Filed Vitals:   09/29/14 1450  BP: 105/62  Pulse: 89  Temp: 98.3 F (36.8 C)  Resp: 19    Intake/Output Summary (Last 24 hours) at 09/29/14 1551 Last data filed at 09/29/14 1452  Gross per 24 hour  Intake    875 ml  Output   1801 ml  Net   -926 ml   Filed Weights   09/25/14 1535 09/28/14 1226  Weight: 86.1 kg (189 lb 13.1 oz) 85.73  kg (189 lb)    Exam:   General: Elderly male , appears fatigued  HEENT: no pallor, moist mucosa  Cardiovascular: NS1&S2, no murmurs  Respiratory: clear b/l  Abdomen: soft, NT, ND,  BS+  Musculoskeletal: warm, no edema  CNS: alert and oriented, rt facial droop, rt dense hemiplegia ( intact sensation with some movement in rt foot )  Data Reviewed: Basic Metabolic Panel:  Recent Labs Lab 09/25/14 1221 09/25/14 1227 09/26/14 0455 09/27/14 0343 10/10/2014 0314  NA 135 137 134* 135 137  K 4.2 4.1 5.0 4.5 4.6  CL 108 104 105 107 109  CO2 18*  --  24 21 23   GLUCOSE 108* 107* 117* 120* 160*  BUN 15 17 15 18 22   CREATININE 1.21 1.20 1.17 1.16 1.11  CALCIUM 8.7  --  8.3* 8.2* 8.3*   Liver Function Tests:  Recent Labs Lab 09/25/14 1221 09/27/14 0343  AST 22 13  ALT 15 11  ALKPHOS 80 64  BILITOT 0.5 0.5  PROT 5.2* 4.4*  ALBUMIN 2.5* 2.0*   No results for input(s): LIPASE, AMYLASE in the last 168 hours. No results for input(s): AMMONIA in the last 168 hours. CBC:  Recent Labs Lab 09/25/14 1221 09/25/14 1227 09/26/14 0455 09/27/14 0343  WBC 12.8*  --  11.2* 13.8*  NEUTROABS 10.6*  --   --   --   HGB 9.8* 10.2* 9.2* 8.4*  HCT 29.2* 30.0* 28.3* 25.2*  MCV 91.3  --  92.8 94.0  PLT 241  --  273 274   Cardiac Enzymes:  Recent Labs Lab 09/25/14 2011  CKTOTAL 91   BNP (last 3 results)  Recent Labs  09/15/14 1320 09/17/14 0455  PROBNP 2151.0* 642.5*   CBG: No results for input(s): GLUCAP in the last 168 hours.  Recent Results (from the past 240 hour(s))  MRSA PCR Screening     Status: None   Collection Time: 09/25/14  4:49 PM  Result Value Ref Range Status   MRSA by PCR NEGATIVE NEGATIVE Final    Comment:        The GeneXpert MRSA Assay (FDA approved for NASAL specimens only), is one component of a comprehensive MRSA colonization surveillance program. It is not intended to diagnose MRSA infection nor to guide or monitor treatment for MRSA infections.      Studies: Mr [name] [name] Contrast  10-Oct-2014   CLINICAL DATA:  Frequent falls. Personal history of esophageal cancer treated with chemotherapy, ongoing. Right arm twitching.  Possible seizure. Abnormal head CT. Abnormal MRI 3 days ago.  EXAM: MRI HEAD WITHOUT AND WITH CONTRAST  TECHNIQUE: Multiplanar, multiecho pulse sequences of the brain and surrounding structures were obtained without and with intravenous contrast.  CONTRAST:  107mL MULTIHANCE GADOBENATE DIMEGLUMINE 529 MG/ML IV SOLN  COMPARISON:  MRI 09/25/2014.  CT 09/25/2014.  FINDINGS: There has been progression of a peripherally enhancing brain lesion in the left posterior frontal vertex. Outer measurements of the enhancing region have increased from 2 x 1.3 x 1.6 cm to 2.4 x 1.9 x 1.4 cm. Vasogenic edema has increased slightly. There is central restricted diffusion. These characteristics raise the possibility of brain abscess in addition to the possibility of rapidly progressive metastatic lesion. No second lesion is seen. The brain does not show any evidence of old or acute infarction. No pituitary mass. No inflammatory sinus disease. No hydrocephalus. No extra-axial collection. No skull or skullbase lesion.  IMPRESSION: Progression/enlargement of the left posterior frontal cortical lesion, increased in size  and showing more vasogenic edema. Imaging characteristics raise concern for the possibility of brain abscess. The differential diagnosis would be that of rapidly progressive solitary metastatic lesion.   Electronically Signed   By: Nelson Chimes M.D.   On: 09/28/2014 09:43   Dg Chest Port 1 View  09/28/2014   CLINICAL DATA:  Hypoxic this am.  Weakness.  EXAM: PORTABLE CHEST - 1 VIEW  COMPARISON:  09/25/2014; 09/14/2014; chest CT -07/18/2014  FINDINGS: Grossly unchanged borderline enlarged cardiac silhouette and mediastinal contours given slightly reduced lung volumes. [name] position of support apparatus. No pneumothorax. The pulmonary vasculature is less distinct on present examination with cephalization of flow. Interval development of small bilateral effusions with associated worsening bibasilar opacities. No  pneumothorax. Unchanged bones. Radiopaque material is seen within the gastric fundus.  IMPRESSION: Findings most suggestive of pulmonary edema with worsening bibasilar opacities, atelectasis versus infiltrate. A follow-up chest radiograph in 4 to 6 weeks after treatment is recommended to ensure resolution.   Electronically Signed   By: Sandi Mariscal M.D.   On: 09/28/2014 08:11    Scheduled Meds: . dexamethasone  4 mg Intravenous Q6H  . enoxaparin (LOVENOX) injection  85 mg Subcutaneous Q12H  . feeding supplement (ENSURE COMPLETE)  237 mL Oral BID BM  . feeding supplement (PRO-STAT SUGAR FREE 64)  30 mL Oral BID WC  . lacosamide (VIMPAT) IV  100 mg Intravenous Q12H  . levETIRAcetam  1,500 mg Intravenous Q12H  . pantoprazole  40 mg Oral Q1200  . tiotropium  18 mcg Inhalation Daily   Continuous Infusions: . sodium chloride 10 mL/hr at 09/28/14 1056      Time spent: 25 minutes    [name] [name], [place]  [facility] Pager [phone]. If 7PM-7AM, please contact night-coverage at [email or web address], password [facility] 09/29/2014, 3:51 PM  LOS: 4 days

## 2014-09-29 NOTE — Progress Notes (Signed)
PT Cancellation Note  Patient Details Name: [name] [name] MRN: [id number] DOB: [birth year]   Cancelled Treatment:    Reason Eval/Treat Not Completed: Patient at procedure or test/unavailable will check back another time/day as schedule allows. Thanks.    Weston Anna, MPT Pager: [phone]

## 2014-09-30 DIAGNOSIS — G8190 Hemiplegia, unspecified affecting unspecified side: Secondary | ICD-10-CM

## 2014-09-30 LAB — CBC
HCT: 25.1 % — ABNORMAL LOW (ref 39.0–52.0)
Hemoglobin: 8.3 g/dL — ABNORMAL LOW (ref 13.0–17.0)
MCH: 31.2 pg (ref 26.0–34.0)
MCHC: 33.1 g/dL (ref 30.0–36.0)
MCV: 94.4 fL (ref 78.0–100.0)
PLATELETS: 236 10*3/uL (ref 150–400)
RBC: 2.66 MIL/uL — ABNORMAL LOW (ref 4.22–5.81)
RDW: 19.7 % — ABNORMAL HIGH (ref 11.5–15.5)
WBC: 14.5 10*3/uL — AB (ref 4.0–10.5)

## 2014-09-30 MED ORDER — SODIUM CHLORIDE 0.9 % IJ SOLN
10.0000 mL | INTRAMUSCULAR | Status: DC | PRN
  Administered 2014-10-11: 20 mL
  Administered 2014-10-11: 10 mL
  Filled 2014-09-30 (×2): qty 40

## 2014-09-30 MED ORDER — OXYCODONE HCL 5 MG PO TABS
5.0000 mg | ORAL_TABLET | ORAL | Status: DC | PRN
  Administered 2014-09-30 – 2014-10-05 (×8): 5 mg via ORAL
  Filled 2014-09-30 (×8): qty 1

## 2014-09-30 NOTE — Progress Notes (Signed)
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. He was  diagnosed with DVT on 09/02/14 and started initially on Lovenox w/ transition to Xarelto. Patient also recently admission to the hospital for diastolic dysfunction and was discharged on 12/21. At skilled nursing facility noted to have focal seizure  involving right arm. Transported via EMS. En route noted to have generalized tonic-clonic seizure that responded to IV Ativan. Upon arrival to ER continued to have right facial and upper extremity twitching. Given IV Keppra and Dilantin. CT head concerning for brain lesion likely metastatic but no evidence of hemorrhage, hydrocephalus, or midline shift. Neurology evaluated the patient.   MRI confirmed left frontal region mass measuring 1.3 x 1.6 x 2 cm. Started on IV Decadron. Neurosurgery and Oncology consulted. Radiation Oncology [name] evaluated; MRI Brain completed 12/30 and to begin [facility] on 12/31. Right side hemiparesis persists and Neurosurgery suspects Todd's paralysis. Pt complained of congestion and mild orthopnea . CXR with edema so 1x IV Lasix given 12/30. Transferred to [place] on 12/30 for radiation.    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  presented with generalized seizures. Has  residual dense right arm hemiplegia, right facial droop, and tongue deviation to left.  -MRI brain shows Progression/enlargement of the left posterior frontal cortical lesion, increased in size and showing more vasogenic edema due to rapidly progressive metastatic lesion.   - Neurosurgery ( Dr Annette Stable) recommends  stereotactic radiosurgery coupled with surgical resection ( planned for  1/7)  continue IV Decadron - Oncology following/await surgical bx and subsequent pathology  - CT stimulation done by radiation oncology on 12/31. Plan for radiation preoperatively on 1/5 or 1/7 - PT/OT following . Recommend SNF.  Stage IIIB esophageal cancer with metastases Followed by Dr Julien Nordmann On systemic chemotherapy with cisplatin, docetaxel and 5-FU. Chemotherapy on 12/7   Focal motor seizure and grand mal seizure -Appreciate Neurology recommendations  - continue IV Keppra; Vimpat added 12/28 due to ongoing seizure activity  -no further seizures since 12/29  Acute respiratory failure with hypoxia/pulmomary edema on CXR (12/30) -Secondary to  altered mentation and recurrent seizures  - continue supportive care with oxygen  Prn lasix for diuresis  Hypertension - metoprolol, valsartan and hydrochlorothiazide on hold due to soft BP  Hyperlipemia  DVT of lower extremity  -New diagnosis 12/4. Anticoagulation with lovenox   BPH   Paroxysmal  A-fib -Rate controlled  Severe protein-calorie malnutrition -On Megace prior to admission. Added supplements -Cleared for regular diet by speech eval   DVT prophylaxis: therapeutic lovenox   Diet: dys level 2  Consultants: Neurology/[name] Neurosurgery/Dr. Pool Oncology/[name] Holter onco/[name]  Procedure:  MRI brain MBS  Code Status: Full  Family Communication: Niece at bedside   Disposition Plan: PT recommends SNF. possibly next week     HPI/Subjective: Right upper hemiplegia persist. Slight improvement in right lower extremity weakness  Objective: Filed Vitals:   09/30/14 1358  BP: 105/61  Pulse: 93  Temp: 99.3 F (37.4 C)  Resp: 17    Intake/Output Summary (Last 24 hours) at 09/30/14 1452 Last data filed at 09/30/14 1401  Gross per 24 hour  Intake    625 ml  Output   2750 ml  Net  -2125  ml   Filed Weights   09/25/14 1535 09/28/14 1226  Weight: 86.1 kg (189 lb 13.1 oz) 85.73 kg  (189 lb)    Exam:   General: Elderly male , appears fatigued  HEENT: no pallor, moist mucosa  Cardiovascular: NS1&S2, no murmurs  Respiratory: clear b/l  Abdomen: soft, NT, ND, BS+  Musculoskeletal: warm, no edema  CNS: alert and oriented, rt facial droop, dense right upper extremity hemiplegia ( intact sensation with some movement in rt foot ), slight improvement in right lower extremity weakness (3/5)  Data Reviewed: Basic Metabolic Panel:  Recent Labs Lab 09/25/14 1221 09/25/14 1227 09/26/14 0455 09/27/14 0343 09/28/14 0314  NA 135 137 134* 135 137  K 4.2 4.1 5.0 4.5 4.6  CL 108 104 105 107 109  CO2 18*  --  24 21 23   GLUCOSE 108* 107* 117* 120* 160*  BUN 15 17 15 18 22   CREATININE 1.21 1.20 1.17 1.16 1.11  CALCIUM 8.7  --  8.3* 8.2* 8.3*   Liver Function Tests:  Recent Labs Lab 09/25/14 1221 09/27/14 0343  AST 22 13  ALT 15 11  ALKPHOS 80 64  BILITOT 0.5 0.5  PROT 5.2* 4.4*  ALBUMIN 2.5* 2.0*   No results for input(s): LIPASE, AMYLASE in the last 168 hours. No results for input(s): AMMONIA in the last 168 hours. CBC:  Recent Labs Lab 09/25/14 1221 09/25/14 1227 09/26/14 0455 09/27/14 0343 09/30/14 0440  WBC 12.8*  --  11.2* 13.8* 14.5*  NEUTROABS 10.6*  --   --   --   --   HGB 9.8* 10.2* 9.2* 8.4* 8.3*  HCT 29.2* 30.0* 28.3* 25.2* 25.1*  MCV 91.3  --  92.8 94.0 94.4  PLT 241  --  273 274 236   Cardiac Enzymes:  Recent Labs Lab 09/25/14 2011  CKTOTAL 91   BNP (last 3 results)  Recent Labs  09/15/14 1320 09/17/14 0455  PROBNP 2151.0* 642.5*   CBG: No results for input(s): GLUCAP in the last 168 hours.  Recent Results (from the past 240 hour(s))  MRSA PCR Screening     Status: None   Collection Time: 09/25/14  4:49 PM  Result Value Ref Range Status   MRSA by PCR NEGATIVE NEGATIVE Final    Comment:        The GeneXpert MRSA Assay (FDA approved for NASAL specimens only), is one component of a comprehensive MRSA  colonization surveillance program. It is not intended to diagnose MRSA infection nor to guide or monitor treatment for MRSA infections.      Studies: No results found.  Scheduled Meds: . dexamethasone  4 mg Intravenous Q6H  . enoxaparin (LOVENOX) injection  85 mg Subcutaneous Q12H  . feeding supplement (ENSURE COMPLETE)  237 mL Oral BID BM  . feeding supplement (PRO-STAT SUGAR FREE 64)  30 mL Oral BID WC  . lacosamide (VIMPAT) IV  100 mg Intravenous Q12H  . levETIRAcetam  1,500 mg Intravenous Q12H  . pantoprazole  40 mg Oral Q1200  . tiotropium  18 mcg Inhalation Daily   Continuous Infusions: . sodium chloride 10 mL/hr at 09/28/14 1056      Time spent: 25 minutes    Cerena Baine, [place]  [facility] Pager [phone]. If 7PM-7AM, please contact night-coverage at [email or web address], password [facility] 09/30/2014, 2:52 PM  LOS: 5 days

## 2014-10-01 MED ORDER — MAGIC MOUTHWASH
5.0000 mL | Freq: Four times a day (QID) | ORAL | Status: DC | PRN
  Administered 2014-10-02: 5 mL via ORAL
  Filled 2014-10-01 (×3): qty 5

## 2014-10-01 NOTE — Progress Notes (Signed)
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. He was  diagnosed with DVT on 09/02/14 and started initially on Lovenox w/ transition to Xarelto. Patient also recently admission to the hospital for diastolic dysfunction and was discharged on 12/21. At skilled nursing facility noted to have focal seizure  involving right arm. Transported via EMS. En route noted to have generalized tonic-clonic seizure that responded to IV Ativan. Upon arrival to ER continued to have right facial and upper extremity twitching. Given IV Keppra and Dilantin. CT head concerning for brain lesion likely metastatic but no evidence of hemorrhage, hydrocephalus, or midline shift. Neurology evaluated the patient.   MRI confirmed left frontal region mass measuring 1.3 x 1.6 x 2 cm. Started on IV Decadron. Neurosurgery and Oncology consulted. Radiation Oncology [name] evaluated; MRI Brain completed 12/30 and to begin [facility] on 12/31. Right side hemiparesis persists and Neurosurgery suspects Todd's paralysis. Pt complained of congestion and mild orthopnea . CXR with edema so 1x IV Lasix given 12/30. Transferred to [place] on 12/30 for radiation.    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  presented with generalized seizures. Has  residual dense right arm hemiplegia, right facial droop, and tongue deviation to left.  -MRI brain shows Progression/enlargement of the left posterior frontal cortical lesion, increased in size and showing more vasogenic edema due to rapidly progressive metastatic lesion.   - Neurosurgery ( Dr Annette Stable) recommends  stereotactic radiosurgery coupled with surgical resection ( planned for  1/7)  continue IV Decadron - Oncology following. await surgical bx and subsequent pathology  - CT stimulation done by radiation oncology on 12/31. Plan for radiation preoperatively on 1/5 or 1/7 - PT/OT following . Recommend SNF.  Stage IIIB esophageal cancer with metastases Followed by Dr Julien Nordmann On systemic chemotherapy with cisplatin, docetaxel and 5-FU. Chemotherapy on 12/7   Focal motor seizure and grand mal seizure -Appreciate Neurology recommendations  - continue IV Keppra. Vimpat added 12/28 due to ongoing seizure activity  -no further seizures since 12/29  Acute respiratory failure with hypoxia/pulmomary edema on CXR (12/30) -Secondary to  altered mentation and recurrent seizures  - continue supportive care with oxygen  Prn lasix for diuresis. Currently stable.  Hypertension - metoprolol, valsartan and hydrochlorothiazide on hold due to soft BP  Hyperlipemia  DVT of lower extremity  -New diagnosis 12/4.  Anticoagulation with lovenox   BPH   Paroxysmal  A-fib -Rate controlled  Severe protein-calorie malnutrition -On Megace prior to admission. Added supplements -Cleared for regular diet by speech eval   DVT prophylaxis:  therapeutic lovenox   Diet: Regular  Consultants: Neurology/[name] Neurosurgery/Dr. Pool Oncology/[name] Holter onco/[name]  Procedure:  MRI brain MBS  Code Status: Full  Family Communication: None at bedside   Disposition Plan: PT recommends SNF. possibly next week      HPI/Subjective: Right upper hemiplegia persist. Able to move his right lower leg slightly. Reports dry mouth with pain  Objective: Filed Vitals:   10/01/14 1454  BP: 108/57  Pulse: 92  Temp: 97.6 F (36.4 C)  Resp: 20    Intake/Output Summary (Last 24 hours) at 10/01/14 1752 Last data filed at 10/01/14 1700  Gross per 24 hour  Intake 878.83 ml  Output  3450 ml  Net -2571.17 ml   Filed Weights   09/25/14 1535 09/28/14 1226   Weight: 86.1 kg (189 lb 13.1 oz) 85.73 kg (189 lb)    Exam:   General: Elderly male , appears fatigued  HEENT: no pallor, moist mucosa, no oral thrush  Cardiovascular: NS1&S2, no murmurs  Respiratory: clear b/l  Abdomen: soft, NT, ND, BS+  Musculoskeletal: warm, no edema  CNS: alert and oriented, rt facial droop, dense right upper extremity hemiplegia ( intact sensation with some movement in rt foot ),  right lower extremity weakness (3/5)  Data Reviewed: Basic Metabolic Panel:  Recent Labs Lab 09/25/14 1221 09/25/14 1227 09/26/14 0455 09/27/14 0343 09/28/14 0314  NA 135 137 134* 135 137  K 4.2 4.1 5.0 4.5 4.6  CL 108 104 105 107 109  CO2 18*  --  24 21 23   GLUCOSE 108* 107* 117* 120* 160*  BUN 15 17 15 18 22   CREATININE 1.21 1.20 1.17 1.16 1.11  CALCIUM 8.7  --  8.3* 8.2* 8.3*   Liver Function Tests:  Recent Labs Lab 09/25/14 1221 09/27/14 0343  AST 22 13  ALT 15 11  ALKPHOS 80 64  BILITOT 0.5 0.5  PROT 5.2* 4.4*  ALBUMIN 2.5* 2.0*   No results for input(s): LIPASE, AMYLASE in the last 168 hours. No results for input(s): AMMONIA in the last 168 hours. CBC:  Recent Labs Lab 09/25/14 1221 09/25/14 1227 09/26/14 0455 09/27/14 0343 09/30/14 0440  WBC 12.8*  --  11.2* 13.8* 14.5*  NEUTROABS 10.6*  --   --   --   --   HGB 9.8* 10.2* 9.2* 8.4* 8.3*  HCT 29.2* 30.0* 28.3* 25.2* 25.1*  MCV 91.3  --  92.8 94.0 94.4  PLT 241  --  273 274 236   Cardiac Enzymes:  Recent Labs Lab 09/25/14 2011  CKTOTAL 91   BNP (last 3 results)  Recent Labs  09/15/14 1320 09/17/14 0455  PROBNP 2151.0* 642.5*   CBG: No results for input(s): GLUCAP in the last 168 hours.  Recent Results (from the past 240 hour(s))  MRSA PCR Screening     Status: None   Collection Time: 09/25/14  4:49 PM  Result Value Ref Range Status   MRSA by PCR NEGATIVE NEGATIVE Final    Comment:        The GeneXpert MRSA Assay (FDA approved for NASAL specimens only), is one  component of a comprehensive MRSA colonization surveillance program. It is not intended to diagnose MRSA infection nor to guide or monitor treatment for MRSA infections.      Studies: No results found.  Scheduled Meds: . dexamethasone  4 mg Intravenous Q6H  . enoxaparin (LOVENOX) injection  85 mg Subcutaneous Q12H  . feeding supplement (ENSURE COMPLETE)  237 mL Oral BID BM  . feeding supplement (PRO-STAT SUGAR FREE 64)  30 mL Oral BID WC  . lacosamide (VIMPAT) IV  100 mg Intravenous Q12H  . levETIRAcetam  1,500 mg Intravenous Q12H  . pantoprazole  40 mg Oral Q1200  . tiotropium  18 mcg Inhalation Daily   Continuous Infusions: . sodium chloride 10 mL/hr at 10/01/14 0758      Time spent: 25 minutes    Emerly Prak  [facility] Pager [phone]. If 7PM-7AM, please contact night-coverage at [email or web address], password [facility] 10/01/2014, 5:52 PM  LOS: 6 days

## 2014-10-01 NOTE — Progress Notes (Addendum)
ANTICOAGULATION CONSULT NOTE - Follow Up Consult  Pharmacy Consult for Heparin (while Xarelto on hold)> transitioned to enoxaparin 12/28. Indication: recent DVT  No Known Allergies  Patient Measurements: Height: 5\' 7"  (170.2 cm) Weight: 189 lb (85.73 kg) IBW/kg (Calculated) : 66.1  Vital Signs: Temp: 97.6 F (36.4 C) (01/02 0555) Temp Source: Oral (01/02 0555) BP: 105/68 mmHg (01/02 0555) Pulse Rate: 86 (01/02 0555)  Labs:  Recent Labs  09/30/14 0440  HGB 8.3*  HCT 25.1*  PLT 236    Estimated Creatinine Clearance: 66.6 mL/min (by C-G formula based on Cr of 1.11).   Assessment: 69 yo M admitted 09/25/2014 with seizure d/t brain mets with h/o metastatic esophageal cancer.  Pharmacy consulted to dose anticoagulation for recent DVT (dx 09/02/14), on xarelto prior to admission then transitioned to heparin gtt to [place] at [facility].  Transferred to [facility] for stereotatic radiation therapy followed by possible neurosurgery.  Today, 10/01/2014  CBC:  1/1: Hgb = 8.3 stable with previous values, pltc WNL  Renal function: SCr WNL with CrCl > 58ml/min  Current weight = 85.7kg  Goal of Therapy:  4hr LMWH anti-Xa level 0.6-1.0 CBC q72h Monitor platelets by anticoagulation protocol: Yes   Plan:  -Continue Enoxaparin 85 mg SQ q12h -Follow CBC (q72h) and SCr, adjust as indicated. -Monitor for bleeding  Doreene Eland, PharmD, BCPS.   Pager: [phone] 10/01/2014 1:40 PM

## 2014-10-02 LAB — CBC
HCT: 26.5 % — ABNORMAL LOW (ref 39.0–52.0)
Hemoglobin: 8.6 g/dL — ABNORMAL LOW (ref 13.0–17.0)
MCH: 30.9 pg (ref 26.0–34.0)
MCHC: 32.5 g/dL (ref 30.0–36.0)
MCV: 95.3 fL (ref 78.0–100.0)
Platelets: 241 10*3/uL (ref 150–400)
RBC: 2.78 MIL/uL — ABNORMAL LOW (ref 4.22–5.81)
RDW: 18.8 % — ABNORMAL HIGH (ref 11.5–15.5)
WBC: 13.8 10*3/uL — ABNORMAL HIGH (ref 4.0–10.5)

## 2014-10-02 MED ORDER — LACOSAMIDE 50 MG PO TABS
100.0000 mg | ORAL_TABLET | Freq: Two times a day (BID) | ORAL | Status: DC
  Administered 2014-10-02 – 2014-10-11 (×18): 100 mg via ORAL
  Filled 2014-10-02: qty 1
  Filled 2014-10-02 (×2): qty 2
  Filled 2014-10-02 (×2): qty 1
  Filled 2014-10-02: qty 2
  Filled 2014-10-02: qty 1
  Filled 2014-10-02: qty 2
  Filled 2014-10-02 (×2): qty 1
  Filled 2014-10-02 (×4): qty 2
  Filled 2014-10-02: qty 1
  Filled 2014-10-02: qty 2
  Filled 2014-10-02 (×2): qty 1

## 2014-10-02 MED ORDER — DEXAMETHASONE 4 MG PO TABS
4.0000 mg | ORAL_TABLET | Freq: Four times a day (QID) | ORAL | Status: DC
  Administered 2014-10-02 – 2014-10-11 (×35): 4 mg via ORAL
  Filled 2014-10-02 (×45): qty 1

## 2014-10-02 MED ORDER — ALUM & MAG HYDROXIDE-SIMETH 200-200-20 MG/5ML PO SUSP
15.0000 mL | Freq: Four times a day (QID) | ORAL | Status: AC | PRN
  Administered 2014-10-02 – 2014-10-07 (×2): 15 mL via ORAL
  Filled 2014-10-02 (×2): qty 30

## 2014-10-02 MED ORDER — DEXAMETHASONE 4 MG PO TABS
4.0000 mg | ORAL_TABLET | Freq: Four times a day (QID) | ORAL | Status: DC
  Filled 2014-10-02 (×4): qty 1

## 2014-10-02 MED ORDER — LEVETIRACETAM 750 MG PO TABS
1500.0000 mg | ORAL_TABLET | Freq: Two times a day (BID) | ORAL | Status: DC
  Administered 2014-10-02 – 2014-10-11 (×18): 1500 mg via ORAL
  Filled 2014-10-02 (×21): qty 2

## 2014-10-02 NOTE — Progress Notes (Signed)
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. He was  diagnosed with DVT on 09/02/14 and started initially on Lovenox w/ transition to Xarelto. Patient also recently admission to the hospital for diastolic dysfunction and was discharged on 12/21. At skilled nursing facility noted to have focal seizure  involving right arm. Transported via EMS. En route noted to have generalized tonic-clonic seizure that responded to IV Ativan. Upon arrival to ER continued to have right facial and upper extremity twitching. Given IV Keppra and Dilantin. CT head concerning for brain lesion likely metastatic but no evidence of hemorrhage, hydrocephalus, or midline shift. Neurology evaluated the patient.   MRI confirmed left frontal region mass measuring 1.3 x 1.6 x 2 cm. Started on IV Decadron. Neurosurgery and Oncology consulted. Radiation Oncology [name] evaluated; MRI Brain completed 12/30 and to begin [facility] on 12/31. Right side hemiparesis persists and Neurosurgery suspects Todd's paralysis. Pt complained of congestion and mild orthopnea . CXR with edema so 1x IV Lasix given 12/30. Transferred to [place] on 12/30 for radiation.    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  presented with generalized seizures. Has  residual dense right arm hemiplegia, right facial droop,  tongue deviation to left has resolved. .  -MRI brain shows Progression/enlargement of the left posterior frontal cortical lesion, increased in size and showing more vasogenic edema due to rapidly progressive metastatic lesion.  -on empiric keppra and decadron, switch to po.  - Neurosurgery ( Dr Annette Stable) recommends  stereotactic  radiosurgery coupled with surgical resection ( planned for 1/7)  continue IV Decadron - Oncology following. await surgical bx and subsequent pathology  - CT stimulation done by radiation oncology on 12/31. Plan for radiation preoperatively on 1/5 or 1/7 - PT/OT following . Recommend SNF.  Stage IIIB esophageal cancer with metastases Followed by Dr Julien Nordmann. On systemic chemotherapy with cisplatin, docetaxel and 5-FU. Chemotherapy on 12/7   Focal motor seizure and grand mal seizure -Appreciate Neurology recommendations  - switch IV keppra to po.  Vimpat added 12/28 due to ongoing seizure activity.  -no further seizures since 12/29  Acute respiratory failure with hypoxia/pulmomary edema on CXR (12/30) -Secondary to  altered mentation and recurrent seizures  - continue supportive care with oxygen  Prn lasix for diuresis. Currently stable.  Hypertension - metoprolol, valsartan and hydrochlorothiazide on hold due to soft BP  Hyperlipemia  DVT of lower extremity  -New diagnosis 12/4.  Anticoagulation with lovenox   BPH   Paroxysmal  A-fib -Rate controlled  Severe protein-calorie malnutrition -On Megace prior to admission. Added supplements -Cleared for regular diet by speech eval   DVT prophylaxis:  therapeutic lovenox   Diet: Regular  Consultants: Neurology/[name] Neurosurgery/Dr. Pool Oncology/[name] Holter onco/[name]  Procedure:  MRI brain MBS  Code Status: Full  Family Communication: NIECE at bedside   Disposition Plan: PT recommends SNF. May keep him in hospital for neurosurgery on 1/7     HPI/Subjective: Right upper hemiplegia persist. . Reports pain in the right side of tongue  Objective: Filed Vitals:   10/02/14 0553  BP: 113/63  Pulse: 84  Temp: 98.1 F (36.7 C)  Resp: 20    Intake/Output Summary (Last 24 hours) at 10/02/14 1410 Last data filed  at 10/02/14 0914  Gross per 24 hour  Intake    480 ml  Output   2400 ml   Net  -1920 ml   Filed Weights   09/25/14 1535 09/28/14 1226  Weight: 86.1 kg (189 lb 13.1 oz) 85.73 kg (189 lb)    Exam:   General: Elderly male , appears fatigued  HEENT: no pallor, moist mucosa, no oral thrush, small bite on right side of the tongue.   Cardiovascular: NS1&S2, no murmurs  Respiratory: clear b/l  Abdomen: soft, NT, ND, BS+  Musculoskeletal: warm, no edema  CNS: alert and oriented, rt facial droop, dense right upper extremity hemiplegia ( intact sensation with some movement in rt foot ),  right lower extremity weakness (3/5)  Data Reviewed: Basic Metabolic Panel:  Recent Labs Lab 09/26/14 0455 09/27/14 0343 09/28/14 0314  NA 134* 135 137  K 5.0 4.5 4.6  CL 105 107 109  CO2 24 21 23   GLUCOSE 117* 120* 160*  BUN 15 18 22   CREATININE 1.17 1.16 1.11  CALCIUM 8.3* 8.2* 8.3*   Liver Function Tests:  Recent Labs Lab 09/27/14 0343  AST 13  ALT 11  ALKPHOS 64  BILITOT 0.5  PROT 4.4*  ALBUMIN 2.0*   No results for input(s): LIPASE, AMYLASE in the last 168 hours. No results for input(s): AMMONIA in the last 168 hours. CBC:  Recent Labs Lab 09/26/14 0455 09/27/14 0343 09/30/14 0440 10/02/14 0625  WBC 11.2* 13.8* 14.5* 13.8*  HGB 9.2* 8.4* 8.3* 8.6*  HCT 28.3* 25.2* 25.1* 26.5*  MCV 92.8 94.0 94.4 95.3  PLT 273 274 236 241   Cardiac Enzymes:  Recent Labs Lab 09/25/14 2011  CKTOTAL 91   BNP (last 3 results)  Recent Labs  09/15/14 1320 09/17/14 0455  PROBNP 2151.0* 642.5*   CBG: No results for input(s): GLUCAP in the last 168 hours.  Recent Results (from the past 240 hour(s))  MRSA PCR Screening     Status: None   Collection Time: 09/25/14  4:49 PM  Result Value Ref Range Status   MRSA by PCR NEGATIVE NEGATIVE Final    Comment:        The GeneXpert MRSA Assay (FDA approved for NASAL specimens only), is one component of a comprehensive MRSA colonization surveillance program. It is not intended to diagnose  MRSA infection nor to guide or monitor treatment for MRSA infections.      Studies: No results found.  Scheduled Meds: . dexamethasone  4 mg Intravenous Q6H  . enoxaparin (LOVENOX) injection  85 mg Subcutaneous Q12H  . feeding supplement (ENSURE COMPLETE)  237 mL Oral BID BM  . feeding supplement (PRO-STAT SUGAR FREE 64)  30 mL Oral BID WC  . lacosamide (VIMPAT) IV  100 mg Intravenous Q12H  . levETIRAcetam  1,500 mg Intravenous Q12H  . pantoprazole  40 mg Oral Q1200  . tiotropium  18 mcg Inhalation Daily   Continuous Infusions: . sodium chloride 10 mL/hr at 10/01/14 0758      Time spent: 25 minutes    Asbury Hair  [facility] Pager [phone]. If 7PM-7AM, please contact night-coverage at [email or web address], password [facility] 10/02/2014, 2:10 PM  LOS: 7 days

## 2014-10-03 MED ORDER — AZELASTINE HCL 0.1 % NA SOLN
1.0000 | Freq: Two times a day (BID) | NASAL | Status: DC
  Administered 2014-10-03 – 2014-10-11 (×16): 1 via NASAL
  Filled 2014-10-03 (×4): qty 30

## 2014-10-03 NOTE — Progress Notes (Signed)
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. He was  diagnosed with DVT on 09/02/14 and started initially on Lovenox w/ transition to Xarelto. Patient also recently admission to the hospital for diastolic dysfunction and was discharged on 12/21. At skilled nursing facility noted to have focal seizure  involving right arm. Transported via EMS. En route noted to have generalized tonic-clonic seizure that responded to IV Ativan. Upon arrival to ER continued to have right facial and upper extremity twitching. Given IV Keppra and Dilantin. CT head concerning for brain lesion likely metastatic but no evidence of hemorrhage, hydrocephalus, or midline shift. Neurology evaluated the patient.   MRI confirmed left frontal region mass measuring 1.3 x 1.6 x 2 cm. Started on IV Decadron. Neurosurgery and Oncology consulted. Radiation Oncology [name] evaluated; MRI Brain completed 12/30 and to begin [facility] on 12/31. Right side hemiparesis persists and Neurosurgery suspects Todd's paralysis. Pt complained of congestion and mild orthopnea . CXR with edema so 1x IV Lasix given 12/30. Transferred to [place] on 12/30 for radiation.    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  presented with generalized seizures. Has  residual dense right arm hemiplegia, right facial droop,  tongue deviation to left has resolved. .  -MRI brain shows Progression/enlargement of the left posterior frontal cortical lesion, increased in size and showing more vasogenic edema due to rapidly progressive metastatic lesion.  -on empiric keppra and decadron, switch to po.  - Neurosurgery ( Dr Annette Stable) recommends  stereotactic  radiosurgery coupled with surgical resection ( planned for 1/7)  switch to oral decadron.  Oncology following. await surgical bx and subsequent pathology  - CT stimulation done by radiation oncology on 12/31. Plan for radiation preoperatively on 1/5 and craniotomy on  1/7 - PT/OT following . Recommend SNF.  Stage IIIB esophageal cancer with metastases Followed by Dr Julien Nordmann. On systemic chemotherapy with cisplatin, docetaxel and 5-FU. Chemotherapy on 12/7   Focal motor seizure and grand mal seizure -Appreciate Neurology recommendations  - switch IV keppra to po.  Vimpat added 12/28 due to ongoing seizure activity.  -no further seizures since 12/29  Acute respiratory failure with hypoxia/pulmomary edema on CXR (12/30) -Secondary to  altered mentation and recurrent seizures  - continue supportive care with oxygen  Prn lasix for diuresis. Currently stable.  Hypertension - metoprolol, valsartan and hydrochlorothiazide on hold due to soft BP  Hyperlipemia  DVT of lower extremity  -New diagnosis 12/4.  Anticoagulation with lovenox   BPH   Paroxysmal  A-fib -Rate controlled  Severe protein-calorie malnutrition -On Megace prior to admission. Added supplements -Cleared for regular diet by speech eval   DVT prophylaxis:  therapeutic lovenox   Diet: Regular  Consultants: Neurology/[name] Neurosurgery/Dr. Pool Oncology/[name] Holter onco/[name]  Procedure:  MRI brain MBS  Code Status: Full  Family Communication: Niece at bedside   Disposition Plan: PT recommends SNF. Will  keep him in hospital for neurosurgery on 1/7     HPI/Subjective: Right upper hemiplegia persist. . No overnight issues  Objective: Filed Vitals:   10/03/14 1409  BP: 98/40  Pulse: 92  Temp: 97 F (36.1 C)  Resp: 18    Intake/Output Summary (Last 24 hours) at 10/03/14 1557 Last data filed  at 10/03/14 1413  Gross per 24 hour  Intake    480 ml  Output   1700 ml  Net   -1220 ml   Filed Weights   09/25/14 1535 09/28/14 1226  Weight: 86.1 kg (189 lb 13.1 oz) 85.73 kg (189 lb)    Exam:   General: Elderly male , appears fatigued  HEENT: no pallor, moist mucosa,   Cardiovascular: NS1&S2, no murmurs  Respiratory: clear b/l  Abdomen: soft, NT, ND, BS+  Musculoskeletal: warm, no edema  CNS: alert and oriented, rt facial droop, dense right upper extremity hemiplegia ( intact sensation with some movement in rt foot ),  right lower extremity weakness (3/5)  Data Reviewed: Basic Metabolic Panel:  Recent Labs Lab 09/27/14 0343 09/28/14 0314  NA 135 137  K 4.5 4.6  CL 107 109  CO2 21 23  GLUCOSE 120* 160*  BUN 18 22  CREATININE 1.16 1.11  CALCIUM 8.2* 8.3*   Liver Function Tests:  Recent Labs Lab 09/27/14 0343  AST 13  ALT 11  ALKPHOS 64  BILITOT 0.5  PROT 4.4*  ALBUMIN 2.0*   No results for input(s): LIPASE, AMYLASE in the last 168 hours. No results for input(s): AMMONIA in the last 168 hours. CBC:  Recent Labs Lab 09/27/14 0343 09/30/14 0440 10/02/14 0625  WBC 13.8* 14.5* 13.8*  HGB 8.4* 8.3* 8.6*  HCT 25.2* 25.1* 26.5*  MCV 94.0 94.4 95.3  PLT 274 236 241   Cardiac Enzymes: No results for input(s): CKTOTAL, CKMB, CKMBINDEX, TROPONINI in the last 168 hours. BNP (last 3 results)  Recent Labs  09/15/14 1320 09/17/14 0455  PROBNP 2151.0* 642.5*   CBG: No results for input(s): GLUCAP in the last 168 hours.  Recent Results (from the past 240 hour(s))  MRSA PCR Screening     Status: None   Collection Time: 09/25/14  4:49 PM  Result Value Ref Range Status   MRSA by PCR NEGATIVE NEGATIVE Final    Comment:        The GeneXpert MRSA Assay (FDA approved for NASAL specimens only), is one component of a comprehensive MRSA colonization surveillance program. It is not intended to diagnose MRSA infection nor to guide or monitor treatment for MRSA infections.      Studies: No results found.  Scheduled Meds: .  dexamethasone  4 mg Oral 4 times per day  . enoxaparin (LOVENOX) injection  85 mg Subcutaneous Q12H  . feeding supplement (ENSURE COMPLETE)  237 mL Oral BID BM  . feeding supplement (PRO-STAT SUGAR FREE 64)  30 mL Oral BID WC  . lacosamide  100 mg Oral BID  . levETIRAcetam  1,500 mg Oral BID  . pantoprazole  40 mg Oral Q1200  . tiotropium  18 mcg Inhalation Daily   Continuous Infusions: . sodium chloride 10 mL/hr at 10/01/14 0758      Time spent: 25 minutes    Fortune Torosian  [facility] Pager [phone]. If 7PM-7AM, please contact night-coverage at [email or web address], password [facility] 10/03/2014, 3:57 PM  LOS: 8 days

## 2014-10-03 NOTE — Progress Notes (Signed)
Physical Therapy Treatment Patient Details Name: [name] [name] MRN: [id number] DOB: Aug 30, 1946 Today's Date: 10/03/2014    History of Present Illness 69 yo male admitted with acute R sided weakness, fall, seizures, progressive L frontal brain mets. Hx of esophageal cancer-undergoing chemotherapy, emphysema, DVT R LE, heart failure, HTN. Pt was at [facility] for rehab.     PT Comments    Improvement noted in ability to follow cues, sitting balance and activity tolerance.  Pt progressed to up in chair at end of session.  Follow Up Recommendations  SNF;Supervision/Assistance - 24 hour     Equipment Recommendations  None recommended by PT    Recommendations for Other Services OT consult     Precautions / Restrictions Precautions Precautions: Fall Restrictions Weight Bearing Restrictions: No    Mobility  Bed Mobility Overal bed mobility: Needs Assistance Bed Mobility: Rolling Rolling: Max assist   Supine to sit: HOB elevated;+2 for safety/equipment;Max assist     General bed mobility comments: Assist for trunk and bil LEs. Increased time. Utilized bedpad for scooting, positioning.   Transfers Overall transfer level: Needs assistance   Transfers: Stand Pivot Transfers Sit to Stand: Mod assist;+2 safety/equipment Stand pivot transfers: Mod assist       General transfer comment: Stand pvt bed to chair with pt following cues for sequence and use of L UE to self assist with sitting into chair  Ambulation/Gait             General Gait Details: NT   Stairs            Wheelchair Mobility    Modified Rankin (Stroke Patients Only)       Balance Overall balance assessment: Needs assistance Sitting-balance support: Feet supported Sitting balance-Leahy Scale: Poor Sitting balance - Comments: Min assist and multimodal cues to maintain balance in sitting.  Wt shifting fwd/bk and side to side and including wt bearing on R UE with support. Postural control: Right  lateral lean                          Cognition Arousal/Alertness: Awake/alert Behavior During Therapy: Flat affect Overall Cognitive Status: Within Functional Limits for tasks assessed                      Exercises      General Comments        Pertinent Vitals/Pain Pain Assessment: No/denies pain    Home Living                      Prior Function            PT Goals (current goals can now be found in the care plan section) Acute Rehab PT Goals Patient Stated Goal: get this arm stronger PT Goal Formulation: With patient Time For Goal Achievement: 10/13/14 Potential to Achieve Goals: Fair Progress towards PT goals: Progressing toward goals    Frequency  Min 3X/week    PT Plan Current plan remains appropriate    Co-evaluation             End of Session Equipment Utilized During Treatment: Gait belt Activity Tolerance: Patient tolerated treatment well Patient left: in chair;with call bell/phone within reach     Time: 1215-1246 PT Time Calculation (min) (ACUTE ONLY): 31 min  Charges:  $Therapeutic Activity: 23-37 mins  G Codes:      Dagmawi Venable Oct 05, 2014, 3:33 PM

## 2014-10-03 NOTE — Progress Notes (Signed)
NUTRITION FOLLOW UP  Intervention:   -Continue to recommend Ensure Complete po BID, each supplement provides 350 kcal and 13 grams of protein -Continue to recommend Pro-Stat BID -Encouraged intake of snacks and meals with use of Magic Mouthwash -RD to continue to monitor  Nutrition Dx:   Inadequate oral intake related to varied appetite as evidenced by pt's report and 5% weight loss in one month  Goal:   Pt to meet >/= 90% of their estimated nutrition needs; progressing  Monitor:   Total protein/energy intake, labs, weights, supplement tolerance  Assessment:   12/28: Patient reports that he was eating well PTA but, that his appetite varies. When he can't eat much he drinks Ensure shakes 2-3 times per day. He reports eating eggs and grits most days for breakfast but, he usually only eats 2 meals. He reports taste changes PTA and oral ulcers- Magic Mouth was helps. Weight history shows pt has lost from 202 lbs to 186 lbs within the past 6 weeks.  Labs: low protein, low albumin, low hemoglobin  1/04: -Diet advanced to Dys2 on 12/29, and upgraded to Regular diet on 1/01 per SLP recds -Pt continues to have varying PO intake of 50-100% PO intake. Reported to dislike hospital food and has only consumed Ensure supplement twice during admit; however documentation indicates pt receiving Ensure and Pro-Stat BID since 1/01 -Pt declined additional snacks or supplement alternatives -Denied current nausea or recent difficulty with taste changes  Height: Ht Readings from Last 1 Encounters:  09/28/14 5\' 7"  (1.702 m)    Weight Status:   Wt Readings from Last 1 Encounters:  09/28/14 189 lb (85.73 kg)    Re-estimated needs:  Kcal: 2200-2400 Protein: 105-120 grams Fluid: 2.4 L/day  Skin: Stage 2 pressure ulcer on sacrum   Diet Order: Diet regular   Intake/Output Summary (Last 24 hours) at 10/03/14 1608 Last data filed at 10/03/14 1413  Gross per 24 hour  Intake    480 ml  Output    1700 ml  Net  -1220 ml    Last BM: 1/04   Labs:   Recent Labs Lab 09/27/14 0343 09/28/14 0314  NA 135 137  K 4.5 4.6  CL 107 109  CO2 21 23  BUN 18 22  CREATININE 1.16 1.11  CALCIUM 8.2* 8.3*  GLUCOSE 120* 160*    CBG (last 3)  No results for input(s): GLUCAP in the last 72 hours.  Scheduled Meds: . dexamethasone  4 mg Oral 4 times per day  . enoxaparin (LOVENOX) injection  85 mg Subcutaneous Q12H  . feeding supplement (ENSURE COMPLETE)  237 mL Oral BID BM  . feeding supplement (PRO-STAT SUGAR FREE 64)  30 mL Oral BID WC  . lacosamide  100 mg Oral BID  . levETIRAcetam  1,500 mg Oral BID  . pantoprazole  40 mg Oral Q1200  . tiotropium  18 mcg Inhalation Daily    Continuous Infusions: . sodium chloride 10 mL/hr at 10/01/14 [place] LDN Clinical Dietitian [phone]

## 2014-10-04 ENCOUNTER — Ambulatory Visit
Admit: 2014-10-04 | Discharge: 2014-10-04 | Disposition: A | Discharge status: Home / Self Care | Payer: Medicare Other | Attending: Radiation Oncology | Admitting: Radiation Oncology

## 2014-10-04 ENCOUNTER — Encounter: Payer: Self-pay | Admitting: Radiation Oncology

## 2014-10-04 DIAGNOSIS — I82401 Acute embolism and thrombosis of unspecified deep veins of right lower extremity: Secondary | ICD-10-CM

## 2014-10-04 DIAGNOSIS — C7931 Secondary malignant neoplasm of brain: Secondary | ICD-10-CM

## 2014-10-04 LAB — BASIC METABOLIC PANEL
Anion gap: 7 (ref 5–15)
BUN: 37 mg/dL — ABNORMAL HIGH (ref 6–23)
CO2: 26 mmol/L (ref 19–32)
Calcium: 8.3 mg/dL — ABNORMAL LOW (ref 8.4–10.5)
Chloride: 99 mEq/L (ref 96–112)
Creatinine, Ser: 0.96 mg/dL (ref 0.50–1.35)
GFR calc Af Amer: >90 mL/min (ref >90)
GFR calc non Af Amer: 83 mL/min — ABNORMAL LOW (ref >90)
Glucose, Bld: 134 mg/dL — ABNORMAL HIGH (ref 70–99)
Potassium: 4.4 mmol/L (ref 3.5–5.1)
Sodium: 132 mmol/L — ABNORMAL LOW (ref 135–145)

## 2014-10-04 MED ORDER — HEPARIN (PORCINE) IN NACL 100-0.45 UNIT/ML-% IJ SOLN
900.0000 [IU]/h | INTRAMUSCULAR | Status: DC
  Administered 2014-10-05: 1150 [IU]/h via INTRAVENOUS
  Filled 2014-10-04 (×2): qty 250

## 2014-10-04 NOTE — Op Note (Signed)
  Name: [name] [name]  MRN: [id number]  Date: 10/04/2014   DOB: Feb 27, 1946  Stereotactic Radiosurgery Operative Note  PRE-OPERATIVE DIAGNOSIS:  Solitary Brain Metastasis  POST-OPERATIVE DIAGNOSIS:  Solitary Brain Metastasis  PROCEDURE:  Stereotactic Radiosurgery  SURGEON:  [name], MD  NARRATIVE: The patient underwent a radiation treatment planning session in the radiation oncology simulation suite under the care of the radiation oncology physician and physicist.  I participated closely in the radiation treatment planning afterwards. The patient underwent planning CT which was fused to 3T high resolution MRI with 1 mm axial slices.  These images were fused on the planning system.  We contoured the gross target volumes and subsequently expanded this to yield the Planning Target Volume. I actively participated in the planning process.  I helped to define and review the target contours and also the contours of the optic pathway, eyes, brainstem and selected nearby organs at risk.  All the dose constraints for critical structures were reviewed and compared to AAPM Task Group 101.  The prescription dose conformity was reviewed.  I approved the plan electronically.    Accordingly, [name] was brought to the TrueBeam stereotactic radiation treatment linac and placed in the custom immobilization mask.  The patient was aligned according to the IR fiducial markers with BrainLab Exactrac, then orthogonal x-rays were used in ExacTrac with the 6DOF robotic table and the shifts were made to align the patient  [name] received stereotactic radiosurgery uneventfully.    The detailed description of the procedure is recorded in the radiation oncology procedure note.  I was present for the duration of the procedure.  DISPOSITION:  Following delivery, the patient was transported to nursing in stable condition and monitored for possible acute effects to be discharged to home in stable condition with  follow-up in one month.  [name], MD 10/04/2014 11:37 AM

## 2014-10-04 NOTE — Progress Notes (Signed)
  Radiation Oncology         ([phone]) [phone] ________________________________  INPATIENT  Stereotactic Treatment Procedure Note  Name: [name] [name]  MRN: [id number]  Date: 10/04/2014  DOB: 10-08-1945  SPECIAL TREATMENT PROCEDURE    ICD-9-CM ICD-10-CM   1. 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  It showed 3-dimensional radiation distributions overlaid onto the planning CT/MRI image set.  The [facility] for the target structures as well as the organs at risk were reviewed. The documentation of the 3D plan and dosimetry are filed in the radiation oncology EMR.  NARRATIVE:  [name] [name] was brought to the TrueBeam stereotactic radiation treatment machine and placed supine on the CT couch. The head frame was applied, and the patient was set up for stereotactic radiosurgery.  Neurosurgery was present for the set-up and delivery  SIMULATION VERIFICATION:  In the couch zero-angle position, the patient underwent Exactrac imaging using the Brainlab system with orthogonal KV images.  These were carefully aligned and repeated to confirm treatment position for each of the isocenters.  The Exactrac snap film verification was repeated at each couch angle.  SPECIAL TREATMENT PROCEDURE: [name] [name] received stereotactic radiosurgery to the following targets: Left frontal 2 cm target was treated preoperatively using 3 Dynamic Conformal Arcs to a prescription dose of 15 Gy.  ExacTrac registration was performed for each couch angle.  The 80% isodose line was prescribed.  6 MV X-rays were delivered in the flattening filter free beam mode.  STEREOTACTIC TREATMENT MANAGEMENT:  Following delivery, the patient was transported back to the floor in stable condition and monitored for possible acute effects.  Vital signs were recorded. The patient  tolerated treatment without significant acute effects, and was discharged to home in stable condition.    PLAN: Proceed to resection in the next few days and then follow-up in one month.  ________________________________  Sheral Apley. Tammi Klippel, M.D.

## 2014-10-04 NOTE — Progress Notes (Signed)
Occupational Therapy Treatment Patient Details Name: [name] [name] MRN: [id number] DOB: 1945/10/26 Today's Date: 10/04/2014    History of present illness 69 yo male admitted with acute R sided weakness, fall, seizures, progressive L frontal brain mets. Hx of esophageal cancer-undergoing chemotherapy, emphysema, DVT R LE, heart failure, HTN. Pt was at [facility] for rehab.    OT comments  Pt is weak and A x 2 is needed for sitting; mobility.  Pt does not have any movement in RUE.  Fatiques easily  Follow Up Recommendations  SNF    Equipment Recommendations  None recommended by OT    Recommendations for Other Services      Precautions / Restrictions Precautions Precautions: Fall       Mobility Bed Mobility   Bed Mobility: Rolling Rolling: Max assist         General bed mobility comments: utilized bed pad to assist with rolling to R; +2 to reposition off L side  Transfers                      Balance                                   ADL                                         General ADL Comments: Pt has not had any movement in RUE since seizure per pt:  tried to faciliate movement with tapping but did not elicit movement.  Repositioned and performed retrograde massage.  Pt brushed teeth with min A (for set up and replacing cup/emesis basin).  Pt used L, non-dominant hand.  worked on rolling:  did not sit EOB; pt fatiqued      Tourist information centre manager   Behavior During Therapy: Flat affect Overall Cognitive Status: Within Functional Limits for tasks assessed                       Extremity/Trunk Assessment               Exercises     Shoulder Instructions       General Comments      Pertinent Vitals/ Pain       Pain Assessment: No/denies pain  Home Living                                          Prior Functioning/Environment              Frequency Min 2X/week     Progress Toward Goals  OT Goals(current goals can now be found in the care plan section)  Progress towards OT goals: OT to reassess next treatment (progressing slowly--will likely need to downgrade goals)     Plan      Co-evaluation                 End of Session     Activity Tolerance Patient limited by fatigue   Patient Left in bed;with family/visitor present  Nurse Communication          Time: 3818-2993 OT Time Calculation (min): 16 min  Charges: OT General Charges $OT Visit: 1 Procedure OT Treatments $Therapeutic Activity: 8-22 mins  Mihaela Fajardo 10/04/2014, 3:29 PM  Lesle Chris, OTR/L [phone] 10/04/2014

## 2014-10-04 NOTE — Progress Notes (Addendum)
ANTICOAGULATION CONSULT NOTE - Follow Up Consult  Pharmacy Consult for Heparin (while Xarelto on hold) --> transitioned to enoxaparin 12/28 --> transition to Heparin 1/6 Indication: recent DVT  No Known Allergies  Patient Measurements: Height: 5\' 7"  (170.2 cm) Weight: 189 lb (85.73 kg) IBW/kg (Calculated) : 66.1 Heparin dosing weight: 83.5 kg  Vital Signs: Temp: 98.4 F (36.9 C) (01/05 1403) Temp Source: Oral (01/05 1403) BP: 99/55 mmHg (01/05 1403) Pulse Rate: 93 (01/05 1403)  Labs:  Recent Labs  10/02/14 0625 10/04/14 0442  HGB 8.6*  --   HCT 26.5*  --   PLT 241  --   CREATININE  --  0.96    Estimated Creatinine Clearance: 77 mL/min (by C-G formula based on Cr of 0.96).   Assessment: 69 yo M admitted 09/25/2014 with seizure d/t brain mets with h/o metastatic esophageal cancer.  Pharmacy consulted to dose anticoagulation for recent DVT (dx 09/02/14), on xarelto prior to admission then transitioned to heparin gtt to [place] [facility].  Transferred to [facility] for stereotatic radiation therapy.  Pharmacy asked to transition patient to heparin infusion on 10/05/14 in preparation for craniotomy on 1/7.  Today, 10/04/2014  CBC:  1/3: Hgb = 8.6 stable with previous values, Pltc WNL  Renal function: SCr WNL with CrCl ~ 77 mL/min  No bleeding issues reported per nursing  Goal of Therapy:  Heparin level 0.3-0.7 units/mL Monitor platelets by anticoagulation protocol: Yes   Plan:   Discussed plan with Dr. Clementeen Graham and Dr. Annette Stable  Discontinue Enoxaparin 85 mg SQ q12h AFTER 2100 dose tonight.  Initiation heparin infusion at 1150 units/hr on 1/6 at 0600.  Check heparin level 8 hours after initiation of heparin drip.  Discontinue heparin infusion 1/7 at 0000 per Neurosurgery (craniotomy planned for 1/7).  CBC in AM.  Monitor for signs/symptoms of bleeding.  Follow-up plans post neurosurgery for restart of anticoagulation.   Lindell Spar, PharmD, BCPS Pager: [phone] 10/04/2014  4:13 PM

## 2014-10-04 NOTE — Progress Notes (Signed)
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. He was  diagnosed with DVT on 09/02/14 and started initially on Lovenox w/ transition to Xarelto. Patient also recently admission to the hospital for diastolic dysfunction and was discharged on 12/21. At skilled nursing facility noted to have focal seizure  involving right arm. Transported via EMS. En route noted to have generalized tonic-clonic seizure that responded to IV Ativan. Upon arrival to ER continued to have right facial and upper extremity twitching. Given IV Keppra and Dilantin. CT head concerning for brain lesion likely metastatic but no evidence of hemorrhage, hydrocephalus, or midline shift. Neurology evaluated the patient.   MRI confirmed left frontal region mass measuring 1.3 x 1.6 x 2 cm. Started on IV Decadron. Neurosurgery and Oncology consulted. Radiation Oncology [name] evaluated; MRI Brain completed 12/30 and to begin [facility] on 12/31. Right side hemiparesis persists and Neurosurgery suspects Todd's paralysis. Pt complained of congestion and mild orthopnea . CXR with edema so 1x IV Lasix given 12/30. Transferred to [facility] on 12/30 for SRS.    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  presented with generalized seizures. Has  residual dense right arm hemiplegia, right facial droop,  tongue deviation to left has resolved. .  -MRI brain shows Progression/enlargement of the left posterior frontal cortical lesion, increased in size and showing more vasogenic edema due to rapidly progressive metastatic lesion.  -on empiric keppra and decadron, switch to po.  - Neurosurgery ( Dr Annette Stable) recommends  stereotactic  radiosurgery (received today) coupled with surgical resection ( a joules for 1/7)  switched to oral decadron.  Oncology following. await surgical bx and subsequent pathology  -He will be transferred to [facility] tomorrow evening. -Discussed plan with Dr. Annette Stable  Stage IIIB esophageal cancer with metastases Followed by Dr Julien Nordmann. On systemic chemotherapy with cisplatin, docetaxel and 5-FU. 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  Vimpat added 12/28 due to ongoing seizure activity.  -no further seizures since 12/29  Acute respiratory failure with hypoxia/pulmomary edema on CXR (12/30) -Secondary to  altered mentation and recurrent seizures  - continue supportive care with oxygen  Prn lasix for diuresis. Currently stable.  Hypertension - metoprolol, valsartan and hydrochlorothiazide on hold due to soft BP  Hyperlipemia  DVT of right lower extremity -New diagnosis 12/4.  Anticoagulation with lovenox. Will switch to IV heparin in a.m.   BPH   Paroxysmal  A-fib -Rate controlled  Severe protein-calorie malnutrition -On Megace prior to admission. Added supplements -Regular diet   DVT prophylaxis:  therapeutic lovenox , start IV heparin in a.m.  Diet: Regular  Consultants: Neurology/[name] Neurosurgery/Dr. Pool Oncology/[name] Holter onco/[name]  Procedure:  MRI brain MBS  Code Status: Full  Family Communication: spoke with niece on 1/4   Disposition Plan: PT recommends SNF. For craniotomy on 1/7 ( [facility] evening)     HPI/Subjective: Right upper hemiplegia persist. . No overnight issues  Objective: Filed Vitals:   10/04/14 1403  BP: 99/55  Pulse: 93  Temp: 98.4 F (36.9 C)  Resp: 20    Intake/Output Summary (Last 24 hours) at 10/04/14 1617 Last data  filed at 10/04/14 1123  Gross per 24 hour  Intake    630 ml  Output   1625  ml  Net   -995 ml   Filed Weights   09/25/14 1535 09/28/14 1226  Weight: 86.1 kg (189 lb 13.1 oz) 85.73 kg (189 lb)    Exam:   General: Elderly male , appears fatigued  HEENT: no pallor, moist mucosa,   Cardiovascular: NS1&S2, no murmurs  Respiratory: clear b/l  Abdomen: soft, NT, ND, BS+  Musculoskeletal: warm, no edema  CNS: alert and oriented, rt facial droop, dense right upper extremity hemiplegia ( intact sensation with some movement in rt foot ),  right lower extremity weakness (3/5)  Data Reviewed: Basic Metabolic Panel:  Recent Labs Lab 09/28/14 0314 10/04/14 0442  NA 137 132*  K 4.6 4.4  CL 109 99  CO2 23 26  GLUCOSE 160* 134*  BUN 22 37*  CREATININE 1.11 0.96  CALCIUM 8.3* 8.3*   Liver Function Tests: No results for input(s): AST, ALT, ALKPHOS, BILITOT, PROT, ALBUMIN in the last 168 hours. No results for input(s): LIPASE, AMYLASE in the last 168 hours. No results for input(s): AMMONIA in the last 168 hours. CBC:  Recent Labs Lab 09/30/14 0440 10/02/14 0625  WBC 14.5* 13.8*  HGB 8.3* 8.6*  HCT 25.1* 26.5*  MCV 94.4 95.3  PLT 236 241   Cardiac Enzymes: No results for input(s): CKTOTAL, CKMB, CKMBINDEX, TROPONINI in the last 168 hours. BNP (last 3 results)  Recent Labs  09/15/14 1320 09/17/14 0455  PROBNP 2151.0* 642.5*   CBG: No results for input(s): GLUCAP in the last 168 hours.  Recent Results (from the past 240 hour(s))  MRSA PCR Screening     Status: None   Collection Time: 09/25/14  4:49 PM  Result Value Ref Range Status   MRSA by PCR NEGATIVE NEGATIVE Final    Comment:        The GeneXpert MRSA Assay (FDA approved for NASAL specimens only), is one component of a comprehensive MRSA colonization surveillance program. It is not intended to diagnose MRSA infection nor to guide or monitor treatment for MRSA infections.      Studies: No results found.  Scheduled Meds: . azelastine  1 spray Each Nare BID  .  dexamethasone  4 mg Oral 4 times per day  . enoxaparin (LOVENOX) injection  85 mg Subcutaneous Q12H  . feeding supplement (ENSURE COMPLETE)  237 mL Oral BID BM  . feeding supplement (PRO-STAT SUGAR FREE 64)  30 mL Oral BID WC  . lacosamide  100 mg Oral BID  . levETIRAcetam  1,500 mg Oral BID  . pantoprazole  40 mg Oral Q1200  . tiotropium  18 mcg Inhalation Daily   Continuous Infusions: . sodium chloride 10 mL/hr at 10/01/14 0758  . [START ON 10/05/2014] heparin        Time spent: 25 minutes    [name] [name], [place]  [facility] Pager [phone]. If 7PM-7AM, please contact night-coverage at [email or web address], password [facility] 10/04/2014, 4:17 PM  LOS: 9 days

## 2014-10-04 NOTE — Progress Notes (Signed)
CSW following for return to [facility] when medically ready. CSW has completed FL2 & will continue to follow and assist with return. Note patient to transfer to [facility] for surgery on Thursday.    Raynaldo Opitz, [facility] Clinical Social Worker cell #: [phone]

## 2014-10-05 LAB — CBC
HEMATOCRIT: 23.6 % — AB (ref 39.0–52.0)
HEMOGLOBIN: 8 g/dL — AB (ref 13.0–17.0)
MCH: 32.4 pg (ref 26.0–34.0)
MCHC: 33.9 g/dL (ref 30.0–36.0)
MCV: 95.5 fL (ref 78.0–100.0)
PLATELETS: 222 10*3/uL (ref 150–400)
RBC: 2.47 MIL/uL — ABNORMAL LOW (ref 4.22–5.81)
RDW: 18.8 % — ABNORMAL HIGH (ref 11.5–15.5)
WBC: 13.5 10*3/uL — ABNORMAL HIGH (ref 4.0–10.5)

## 2014-10-05 LAB — HEPARIN LEVEL (UNFRACTIONATED): HEPARIN UNFRACTIONATED: 0.98 [IU]/mL — AB (ref 0.30–0.70)

## 2014-10-05 MED ORDER — SENNOSIDES-DOCUSATE SODIUM 8.6-50 MG PO TABS
2.0000 | ORAL_TABLET | Freq: Two times a day (BID) | ORAL | Status: DC
  Administered 2014-10-05 – 2014-10-10 (×8): 2 via ORAL
  Filled 2014-10-05 (×6): qty 2
  Filled 2014-10-05: qty 1
  Filled 2014-10-05 (×5): qty 2

## 2014-10-05 MED ORDER — POLYETHYLENE GLYCOL 3350 17 G PO PACK
17.0000 g | PACK | Freq: Every day | ORAL | Status: DC
  Administered 2014-10-08: 17 g via ORAL
  Filled 2014-10-05 (×6): qty 1

## 2014-10-05 NOTE — Progress Notes (Signed)
[facility] called for transport of patient. [facility] states that there is currently 2 1/2-3 hour waiting time before patient pickup and trasfer.

## 2014-10-05 NOTE — Progress Notes (Signed)
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. He was  diagnosed with DVT on 09/02/14 and started initially on Lovenox w/ transition to Xarelto. Patient also recently admission to the hospital for diastolic dysfunction and was discharged on 12/21. At skilled nursing facility noted to have focal seizure  involving right arm. Transported via EMS. En route noted to have generalized tonic-clonic seizure that responded to IV Ativan. Upon arrival to ER continued to have right facial and upper extremity twitching. Given IV Keppra and Dilantin. CT head concerning for brain lesion likely metastatic but no evidence of hemorrhage, hydrocephalus, or midline shift. Neurology evaluated the patient.   MRI confirmed left frontal region mass measuring 1.3 x 1.6 x 2 cm. Started on IV Decadron. Neurosurgery and Oncology consulted. Radiation Oncology [name] evaluated; MRI Brain completed 12/30 and to begin [facility] on 12/31. Right side hemiparesis persists and Neurosurgery suspects Todd's paralysis. Pt complained of congestion and mild orthopnea . CXR with edema so 1x IV Lasix given 12/30. Transferred to [facility] on 12/30 for SRS. 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  presented with generalized seizures. Has  residual dense right arm hemiplegia, right facial droop,  tongue deviation to left has resolved. .  -MRI brain shows Progression/enlargement of the left posterior frontal cortical lesion, increased in size and showing more vasogenic edema due to rapidly progressive metastatic lesion.  -on empiric keppra and decadron, switch to po.  - Neurosurgery ( Dr  Annette Stable) recommends  stereotactic radiosurgery (received today) coupled with surgical resection ( a joules for 1/7)  switched to oral decadron.  Oncology following. await surgical bx and subsequent pathology  -He will be transferred to [facility] tomorrow evening. -Discussed plan with Dr. Annette Stable  Stage IIIB esophageal cancer with metastases Followed by Dr Julien Nordmann. On systemic chemotherapy with cisplatin, docetaxel and 5-FU. 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  Vimpat added 12/28 due to ongoing seizure activity.  -no further seizures since 12/29  Acute respiratory failure with hypoxia/pulmomary edema on CXR (12/30) -Secondary to  altered mentation and recurrent seizures  - continue supportive care with oxygen  Prn lasix for diuresis. Currently stable.  Hypertension - metoprolol, valsartan and hydrochlorothiazide on hold due to soft BP  Hyperlipemia  DVT of right lower extremity -New diagnosis 12/4.  IV heparin from 1/6   BPH   Paroxysmal  A-fib -Rate controlled  Severe protein-calorie malnutrition -On Megace prior to admission. Added supplements -Regular diet  Constipation: Started on senna colace and miralax.    DVT prophylaxis:  therapeutic lovenox , start IV heparin in a.m.  Diet: Regular  Consultants: Neurology/[name] Neurosurgery/Dr. Pool Oncology/[name] Holter onco/[name]  Procedure:  MRI brain MBS  Code Status: Full  Family Communication: spoke with niece on 1/4   Disposition Plan: PT recommends SNF. For craniotomy on 1/7 ( [facility] evening)     HPI/Subjective: Right upper hemiplegia persist. . No overnight issues  Objective: Filed Vitals:   10/05/14 1512  BP: 99/56  Pulse: 93  Temp: 98.6 F (37 C)  Resp: 19  Intake/Output Summary (Last 24 hours) at 10/05/14 1752 Last data filed at 10/05/14 1512  Gross per 24  hour  Intake 2963.53 ml  Output   2095 ml  Net 868.53 ml   Filed Weights   09/25/14 1535 09/28/14 1226  Weight: 86.1 kg (189 lb 13.1 oz) 85.73 kg (189 lb)    Exam:   General: Elderly male , appears fatigued  HEENT: no pallor, moist mucosa,   Cardiovascular: NS1&S2, no murmurs  Respiratory: clear b/l  Abdomen: soft, NT, ND, BS+  Musculoskeletal: warm, no edema  CNS: alert and oriented, rt facial droop, dense right upper extremity hemiplegia ( intact sensation with some movement in rt foot ),  right lower extremity weakness (3/5)  Data Reviewed: Basic Metabolic Panel:  Recent Labs Lab 10/04/14 0442  NA 132*  K 4.4  CL 99  CO2 26  GLUCOSE 134*  BUN 37*  CREATININE 0.96  CALCIUM 8.3*   Liver Function Tests: No results for input(s): AST, ALT, ALKPHOS, BILITOT, PROT, ALBUMIN in the last 168 hours. No results for input(s): LIPASE, AMYLASE in the last 168 hours. No results for input(s): AMMONIA in the last 168 hours. CBC:  Recent Labs Lab 09/30/14 0440 10/02/14 0625 10/05/14 0450  WBC 14.5* 13.8* 13.5*  HGB 8.3* 8.6* 8.0*  HCT 25.1* 26.5* 23.6*  MCV 94.4 95.3 95.5  PLT 236 241 222   Cardiac Enzymes: No results for input(s): CKTOTAL, CKMB, CKMBINDEX, TROPONINI in the last 168 hours. BNP (last 3 results)  Recent Labs  09/15/14 1320 09/17/14 0455  PROBNP 2151.0* 642.5*   CBG: No results for input(s): GLUCAP in the last 168 hours.  No results found for this or any previous visit (from the past 240 hour(s)).   Studies: No results found.  Scheduled Meds: . azelastine  1 spray Each Nare BID  . dexamethasone  4 mg Oral 4 times per day  . feeding supplement (ENSURE COMPLETE)  237 mL Oral BID BM  . feeding supplement (PRO-STAT SUGAR FREE 64)  30 mL Oral BID WC  . lacosamide  100 mg Oral BID  . levETIRAcetam  1,500 mg Oral BID  . pantoprazole  40 mg Oral Q1200  . polyethylene glycol  17 g Oral Daily  . senna-docusate  2 tablet Oral BID  . tiotropium   18 mcg Inhalation Daily   Continuous Infusions: . sodium chloride 10 mL/hr at 10/01/14 0758  . heparin 900 Units/hr (10/05/14 1643)      Time spent: 25 minutes    Chaselyn Nanney  [facility] Pager [phone] If 7PM-7AM, please contact night-coverage at [email or web address], password [facility] 10/05/2014, 5:52 PM  LOS: 10 days

## 2014-10-05 NOTE — Progress Notes (Signed)
ANTICOAGULATION CONSULT NOTE - Follow Up Consult  Pharmacy Consult for Heparin (while Xarelto on hold) --> transitioned to enoxaparin 12/28 --> transition to Heparin 1/6 Indication: recent DVT, PAF  No Known Allergies  Patient Measurements: Height: 5\' 7"  (170.2 cm) Weight: 189 lb (85.73 kg) IBW/kg (Calculated) : 66.1 Heparin dosing weight: 83.5 kg  Vital Signs: Temp: 98.6 F (37 C) (01/06 1512) Temp Source: Oral (01/06 1512) BP: 99/56 mmHg (01/06 1512) Pulse Rate: 93 (01/06 1512)  Labs:  Recent Labs  10/04/14 0442 10/05/14 0450 10/05/14 1453  HGB  --  8.0*  --   HCT  --  23.6*  --   PLT  --  222  --   HEPARINUNFRC  --   --  0.98*  CREATININE 0.96  --   --     Estimated Creatinine Clearance: 77 mL/min (by C-G formula based on Cr of 0.96).   Assessment: 69 yo M admitted 09/25/2014 with seizure d/t brain mets with h/o metastatic esophageal cancer.  Pharmacy consulted to dose anticoagulation for recent DVT (dx 09/02/14), on xarelto prior to admission then transitioned to heparin gtt to [place] at [facility].  Transferred to [facility] for stereotatic radiation therapy.  Pharmacy asked to transition patient to heparin infusion on 10/05/14 in preparation for craniotomy on 1/7.  Today, 10/05/2014  CBC:  Hgb 8, stable with previous values, Pltc WNL  Renal function: SCr WNL with CrCl ~ 77 mL/min  No bleeding issues reported per nursing.  No issues with IV site per nursing.  Heparin level = 0.98 (supratherapeutic)  Goal of Therapy:  Heparin level 0.3-0.7 units/mL Monitor platelets by anticoagulation protocol: Yes   Plan:   Decrease heparin infusion to 900 units/hr.  Discontinue heparin infusion 1/7 at 0000 per Neurosurgery/Dr. Annette Stable (craniotomy planned for 1/7); therefore, no further heparin levels needed prior to surgery.  CBC in AM.  Monitor for signs/symptoms of bleeding.  Follow-up plans post neurosurgery for restart of anticoagulation.   Lindell Spar, PharmD, BCPS Pager:  [phone] 10/05/2014 4:28 PM

## 2014-10-05 NOTE — Anesthesia Preprocedure Evaluation (Addendum)
Anesthesia Evaluation  Patient identified by MRN, date of birth, ID band Patient awake    Reviewed: Allergy & Precautions, NPO status , Patient's Chart, lab work & pertinent test results, reviewed documented beta blocker date and time   Airway Mallampati: II   Neck ROM: Full    Dental  (+) Teeth Intact   Pulmonary former smoker (quit 1970? 70 pack year?),  breath sounds clear to auscultation        Cardiovascular hypertension, Pt. on medications Rhythm:Regular  ECHO 09/16/2014 EF 65%, EKG sinus tach   Neuro/Psych Seizures - (medicated),  Metastatic brain tumor L sided, seizures    GI/Hepatic Neg liver ROS, GERD-  Medicated,Esophageal CA distal 1/3   Endo/Other    Renal/GU GFR 90     Musculoskeletal   Abdominal (+)  Abdomen: soft.    Peds  Hematology  (+) anemia , 8/23   Anesthesia Other Findings   Reproductive/Obstetrics                           Anesthesia Physical Anesthesia Plan  ASA: III  Anesthesia Plan: General   Post-op Pain Management:    Induction: Intravenous  Airway Management Planned: Oral ETT  Additional Equipment: Arterial line  Intra-op Plan:   Post-operative Plan:   Informed Consent: I have reviewed the patients History and Physical, chart, labs and discussed the procedure including the risks, benefits and alternatives for the proposed anesthesia with the patient or authorized representative who has indicated his/her understanding and acceptance.     Plan Discussed with:   Anesthesia Plan Comments: (Will order T&S, 2nd IV please, arterial line)       Anesthesia Quick Evaluation

## 2014-10-05 NOTE — Progress Notes (Signed)
PT Cancellation Note  Patient Details Name: [name] [name] MRN: [id number] DOB: 25-Jan-1946   Cancelled Treatment:    Pt scheduled for surgery tomorrow.  Will hold off until after if appropriate.   Rica Koyanagi  PTA [facility]  Acute  Rehab Pager      [phone]

## 2014-10-05 NOTE — Progress Notes (Signed)
Occupational Therapy Treatment Patient Details Name: [name] [name] MRN: [id number] DOB: 07/03/1946 Today's Date: 10/05/2014    History of present illness 69 yo male admitted with acute R sided weakness, fall, seizures, progressive L frontal brain mets. Hx of esophageal cancer-undergoing chemotherapy, emphysema, DVT R LE, heart failure, HTN. Pt was at [facility] for rehab.       Follow Up Recommendations  SNF    Equipment Recommendations  None recommended by OT       Precautions / Restrictions Precautions Precautions: Fall       Mobility Bed Mobility Overal bed mobility: Needs Assistance Bed Mobility: Rolling     Supine to sit: HOB elevated;+2 for safety/equipment;Max assist Sit to supine: HOB elevated;Mod assist;+2 for physical assistance;+2 for safety/equipment      Transfers                          ADL Overall ADL's : Needs assistance/impaired                                       General ADL Comments: Pt fatigued. Pts RUe with no movement- but was down beside him in bed with some edema noted in hand. Educated pt on positioning of positioning - RUe propped on 3 pillows.        Vision                                          Pertinent Vitals/ Pain       Pain Assessment: No/denies pain         Frequency Min 2X/week     Progress Toward Goals  OT Goals(current goals can now be found in the care plan section)  Progress towards OT goals: OT to reassess next treatment ([facility])     Plan         End of Session     Activity Tolerance Patient limited by fatigue   Patient Left in bed;with family/visitor present   Nurse Communication          Time: 1191-4782 OT Time Calculation (min): 9 min  Charges: OT General Charges $OT Visit: 1 Procedure OT Treatments $Therapeutic Activity: 8-22 mins  [name] [name] D 10/05/2014, 10:08 AM

## 2014-10-05 NOTE — Progress Notes (Signed)
Family at bedside reported that patient had some twitching like movement of the right (non-mobile) arm. Neruo check was completed at patient was back at this evening's assessment's baseline. This RN told the family to call if anything changed. Will continue to monitor.

## 2014-10-05 NOTE — Progress Notes (Signed)
Report called and given to [facility] on 4N at [facility] [place].

## 2014-10-06 ENCOUNTER — Encounter (HOSPITAL_COMMUNITY)
Admission: EM | Disposition: A | Discharge status: Skilled Nursing Facility | Payer: Self-pay | Source: Home / Self Care | Attending: Internal Medicine

## 2014-10-06 ENCOUNTER — Other Ambulatory Visit: Payer: Self-pay | Admitting: Surgery

## 2014-10-06 ENCOUNTER — Encounter (HOSPITAL_COMMUNITY): Payer: Self-pay | Admitting: Certified Registered Nurse Anesthetist

## 2014-10-06 ENCOUNTER — Inpatient Hospital Stay (HOSPITAL_COMMUNITY): Payer: Medicare Other | Admitting: Anesthesiology

## 2014-10-06 DIAGNOSIS — C7931 Secondary malignant neoplasm of brain: Secondary | ICD-10-CM | POA: Diagnosis present

## 2014-10-06 HISTORY — PX: CRANIOTOMY: SHX93

## 2014-10-06 LAB — CBC
HCT: 24.4 % — ABNORMAL LOW (ref 39.0–52.0)
HEMOGLOBIN: 8.1 g/dL — AB (ref 13.0–17.0)
MCH: 31.3 pg (ref 26.0–34.0)
MCHC: 33.2 g/dL (ref 30.0–36.0)
MCV: 94.2 fL (ref 78.0–100.0)
Platelets: 242 10*3/uL (ref 150–400)
RBC: 2.59 MIL/uL — AB (ref 4.22–5.81)
RDW: 18.9 % — ABNORMAL HIGH (ref 11.5–15.5)
WBC: 13.2 10*3/uL — AB (ref 4.0–10.5)

## 2014-10-06 LAB — COMPREHENSIVE METABOLIC PANEL
ALK PHOS: 63 U/L (ref 39–117)
ALT: 26 U/L (ref 0–53)
AST: 20 U/L (ref 0–37)
Albumin: 2 g/dL — ABNORMAL LOW (ref 3.5–5.2)
Anion gap: 3 — ABNORMAL LOW (ref 5–15)
BUN: 34 mg/dL — AB (ref 6–23)
CALCIUM: 8.2 mg/dL — AB (ref 8.4–10.5)
CO2: 26 mmol/L (ref 19–32)
Chloride: 102 mEq/L (ref 96–112)
Creatinine, Ser: 0.96 mg/dL (ref 0.50–1.35)
GFR, EST NON AFRICAN AMERICAN: 83 mL/min — AB (ref >90)
Glucose, Bld: 147 mg/dL — ABNORMAL HIGH (ref 70–99)
POTASSIUM: 4.5 mmol/L (ref 3.5–5.1)
Sodium: 131 mmol/L — ABNORMAL LOW (ref 135–145)
TOTAL PROTEIN: 4.4 g/dL — AB (ref 6.0–8.3)
Total Bilirubin: 0.3 mg/dL (ref 0.3–1.2)

## 2014-10-06 LAB — MRSA PCR SCREENING: MRSA by PCR: NEGATIVE

## 2014-10-06 LAB — GRAM STAIN

## 2014-10-06 LAB — PREPARE RBC (CROSSMATCH)

## 2014-10-06 LAB — ABO/RH: ABO/RH(D): O POS

## 2014-10-06 SURGERY — CRANIOTOMY TUMOR EXCISION
Anesthesia: General | Site: Head

## 2014-10-06 MED ORDER — SODIUM CHLORIDE 0.9 % IV SOLN
INTRAVENOUS | Status: DC | PRN
  Administered 2014-10-06 (×2): via INTRAVENOUS

## 2014-10-06 MED ORDER — POTASSIUM CHLORIDE IN NACL 20-0.9 MEQ/L-% IV SOLN
INTRAVENOUS | Status: DC
  Administered 2014-10-06: 12:00:00 via INTRAVENOUS
  Administered 2014-10-07: 1000 mL via INTRAVENOUS
  Administered 2014-10-08 – 2014-10-11 (×3): via INTRAVENOUS
  Filled 2014-10-06 (×11): qty 1000

## 2014-10-06 MED ORDER — VANCOMYCIN HCL IN DEXTROSE 1-5 GM/200ML-% IV SOLN
1000.0000 mg | Freq: Two times a day (BID) | INTRAVENOUS | Status: DC
  Administered 2014-10-07 – 2014-10-09 (×6): 1000 mg via INTRAVENOUS
  Filled 2014-10-06 (×7): qty 200

## 2014-10-06 MED ORDER — FENTANYL CITRATE 0.05 MG/ML IJ SOLN
INTRAMUSCULAR | Status: AC
  Filled 2014-10-06: qty 5

## 2014-10-06 MED ORDER — THROMBIN 20000 UNITS EX SOLR
CUTANEOUS | Status: DC | PRN
  Administered 2014-10-06: 20 mL via TOPICAL

## 2014-10-06 MED ORDER — SODIUM CHLORIDE 0.9 % IR SOLN
Status: DC | PRN
  Administered 2014-10-06: 500 mL

## 2014-10-06 MED ORDER — ROCURONIUM BROMIDE 100 MG/10ML IV SOLN
INTRAVENOUS | Status: DC | PRN
  Administered 2014-10-06: 40 mg via INTRAVENOUS

## 2014-10-06 MED ORDER — ONDANSETRON HCL 4 MG/2ML IJ SOLN
INTRAMUSCULAR | Status: AC
  Filled 2014-10-06: qty 2

## 2014-10-06 MED ORDER — ONDANSETRON HCL 4 MG/2ML IJ SOLN
INTRAMUSCULAR | Status: DC | PRN
  Administered 2014-10-06: 4 mg via INTRAVENOUS

## 2014-10-06 MED ORDER — PROPOFOL 10 MG/ML IV BOLUS
INTRAVENOUS | Status: AC
  Filled 2014-10-06: qty 20

## 2014-10-06 MED ORDER — HYDROMORPHONE HCL 1 MG/ML IJ SOLN
0.5000 mg | INTRAMUSCULAR | Status: DC | PRN
  Filled 2014-10-06: qty 1

## 2014-10-06 MED ORDER — ROCURONIUM BROMIDE 50 MG/5ML IV SOLN
INTRAVENOUS | Status: AC
  Filled 2014-10-06: qty 1

## 2014-10-06 MED ORDER — ACETAMINOPHEN 325 MG PO TABS: 650.0000 mg | ORAL_TABLET | ORAL | Status: DC | PRN

## 2014-10-06 MED ORDER — SUCCINYLCHOLINE CHLORIDE 20 MG/ML IJ SOLN
INTRAMUSCULAR | Status: AC
  Filled 2014-10-06: qty 1

## 2014-10-06 MED ORDER — LIDOCAINE HCL (CARDIAC) 20 MG/ML IV SOLN
INTRAVENOUS | Status: DC | PRN
  Administered 2014-10-06: 100 mg via INTRAVENOUS
  Administered 2014-10-06: 60 mg via INTRATRACHEAL

## 2014-10-06 MED ORDER — ACETAMINOPHEN 650 MG RE SUPP: 650.0000 mg | RECTAL | Status: DC | PRN

## 2014-10-06 MED ORDER — ALBUTEROL SULFATE (2.5 MG/3ML) 0.083% IN NEBU: 2.5000 mg | INHALATION_SOLUTION | RESPIRATORY_TRACT | Status: DC | PRN

## 2014-10-06 MED ORDER — PROMETHAZINE HCL 25 MG/ML IJ SOLN: 6.2500 mg | INTRAMUSCULAR | Status: DC | PRN

## 2014-10-06 MED ORDER — DEXTROSE 5 % IV SOLN
10.0000 mg | INTRAVENOUS | Status: DC | PRN
  Administered 2014-10-06: 40 ug/min via INTRAVENOUS

## 2014-10-06 MED ORDER — OXYCODONE HCL 5 MG PO TABS
5.0000 mg | ORAL_TABLET | ORAL | Status: DC | PRN
  Administered 2014-10-07 – 2014-10-10 (×6): 5 mg via ORAL
  Filled 2014-10-06 (×6): qty 1

## 2014-10-06 MED ORDER — LIDOCAINE HCL (CARDIAC) 20 MG/ML IV SOLN
INTRAVENOUS | Status: AC
  Filled 2014-10-06: qty 10

## 2014-10-06 MED ORDER — SODIUM CHLORIDE 0.9 % IR SOLN
Status: DC | PRN
  Administered 2014-10-06 (×2): 1000 mL

## 2014-10-06 MED ORDER — VANCOMYCIN HCL 10 G IV SOLR
1500.0000 mg | Freq: Once | INTRAVENOUS | Status: AC
  Administered 2014-10-06: 1500 mg via INTRAVENOUS
  Filled 2014-10-06: qty 1500

## 2014-10-06 MED ORDER — METRONIDAZOLE IN NACL 5-0.79 MG/ML-% IV SOLN
500.0000 mg | Freq: Four times a day (QID) | INTRAVENOUS | Status: DC
  Administered 2014-10-06 – 2014-10-10 (×16): 500 mg via INTRAVENOUS
  Filled 2014-10-06 (×21): qty 100

## 2014-10-06 MED ORDER — FENTANYL CITRATE 0.05 MG/ML IJ SOLN
INTRAMUSCULAR | Status: DC | PRN
  Administered 2014-10-06: 100 ug via INTRAVENOUS
  Administered 2014-10-06 (×2): 50 ug via INTRAVENOUS

## 2014-10-06 MED ORDER — STERILE WATER FOR INJECTION IJ SOLN
INTRAMUSCULAR | Status: AC
  Filled 2014-10-06: qty 10

## 2014-10-06 MED ORDER — PROPOFOL 10 MG/ML IV BOLUS
INTRAVENOUS | Status: DC | PRN
  Administered 2014-10-06: 140 mg via INTRAVENOUS
  Administered 2014-10-06: 20 mg via INTRAVENOUS
  Administered 2014-10-06: 40 mg via INTRAVENOUS

## 2014-10-06 MED ORDER — HYDROCODONE-ACETAMINOPHEN 5-325 MG PO TABS: 1.0000 | ORAL_TABLET | ORAL | Status: DC | PRN

## 2014-10-06 MED ORDER — MICROFIBRILLAR COLL HEMOSTAT EX PADS
MEDICATED_PAD | CUTANEOUS | Status: DC | PRN
Start: 2014-10-06 — End: 2014-10-06
  Administered 2014-10-06: 1 via TOPICAL

## 2014-10-06 MED ORDER — EPHEDRINE SULFATE 50 MG/ML IJ SOLN
INTRAMUSCULAR | Status: AC
  Filled 2014-10-06: qty 1

## 2014-10-06 MED ORDER — CEFAZOLIN SODIUM-DEXTROSE 2-3 GM-% IV SOLR
INTRAVENOUS | Status: DC | PRN
  Administered 2014-10-06: 2 g via INTRAVENOUS

## 2014-10-06 MED ORDER — CEFAZOLIN SODIUM-DEXTROSE 2-3 GM-% IV SOLR
2.0000 g | Freq: Three times a day (TID) | INTRAVENOUS | Status: DC
  Administered 2014-10-06: 2 g via INTRAVENOUS
  Filled 2014-10-06 (×2): qty 50

## 2014-10-06 MED ORDER — FENTANYL CITRATE 0.05 MG/ML IJ SOLN: 25.0000 ug | INTRAMUSCULAR | Status: DC | PRN

## 2014-10-06 MED ORDER — BUPIVACAINE HCL (PF) 0.25 % IJ SOLN
INTRAMUSCULAR | Status: DC | PRN
  Administered 2014-10-06: 5 mL

## 2014-10-06 MED ORDER — ARTIFICIAL TEARS OP OINT
TOPICAL_OINTMENT | OPHTHALMIC | Status: DC | PRN
  Administered 2014-10-06: 1 via OPHTHALMIC

## 2014-10-06 MED ORDER — PROMETHAZINE HCL 25 MG PO TABS: 12.5000 mg | ORAL_TABLET | ORAL | Status: DC | PRN

## 2014-10-06 MED ORDER — ARTIFICIAL TEARS OP OINT
TOPICAL_OINTMENT | OPHTHALMIC | Status: AC
  Filled 2014-10-06: qty 3.5

## 2014-10-06 MED ORDER — CEFTRIAXONE SODIUM IN DEXTROSE 40 MG/ML IV SOLN
2.0000 g | Freq: Two times a day (BID) | INTRAVENOUS | Status: DC
  Administered 2014-10-06 – 2014-10-11 (×10): 2 g via INTRAVENOUS
  Filled 2014-10-06 (×12): qty 50

## 2014-10-06 MED ORDER — BACITRACIN ZINC 500 UNIT/GM EX OINT
TOPICAL_OINTMENT | CUTANEOUS | Status: DC | PRN
  Administered 2014-10-06: 1 via TOPICAL

## 2014-10-06 MED ORDER — ALBUMIN HUMAN 5 % IV SOLN
INTRAVENOUS | Status: DC | PRN
  Administered 2014-10-06: 09:00:00 via INTRAVENOUS

## 2014-10-06 MED ORDER — LABETALOL HCL 5 MG/ML IV SOLN: 10.0000 mg | INTRAVENOUS | Status: DC | PRN

## 2014-10-06 MED ORDER — NEOSTIGMINE METHYLSULFATE 10 MG/10ML IV SOLN
INTRAVENOUS | Status: DC | PRN
  Administered 2014-10-06: 4 mg via INTRAVENOUS

## 2014-10-06 MED ORDER — MEPERIDINE HCL 25 MG/ML IJ SOLN: 6.2500 mg | INTRAMUSCULAR | Status: DC | PRN

## 2014-10-06 MED ORDER — MIDAZOLAM HCL 2 MG/2ML IJ SOLN
INTRAMUSCULAR | Status: AC
  Filled 2014-10-06: qty 2

## 2014-10-06 MED ORDER — GLYCOPYRROLATE 0.2 MG/ML IJ SOLN
INTRAMUSCULAR | Status: DC | PRN
  Administered 2014-10-06: 0.6 mg via INTRAVENOUS

## 2014-10-06 SURGICAL SUPPLY — 67 items
BAG DECANTER FOR FLEXI CONT (MISCELLANEOUS) ×3 IMPLANT
BLADE CLIPPER SURG (BLADE) ×3 IMPLANT
BNDG COHESIVE 4X5 TAN NS LF (GAUZE/BANDAGES/DRESSINGS) ×3 IMPLANT
BRUSH SCRUB EZ 1% IODOPHOR (MISCELLANEOUS) ×3 IMPLANT
BRUSH SCRUB EZ PLAIN DRY (MISCELLANEOUS) ×3 IMPLANT
BUR ACORN 6.0 PRECISION (BURR) ×2 IMPLANT
BUR ACORN 6.0MM PRECISION (BURR) ×1
BUR ROUTER D-58 CRANI (BURR) IMPLANT
CANISTER SUCT 3000ML (MISCELLANEOUS) ×6 IMPLANT
CLIP TI MEDIUM 6 (CLIP) ×3 IMPLANT
CONT SPEC 4OZ CLIKSEAL STRL BL (MISCELLANEOUS) ×6 IMPLANT
DRAPE CAMERA VIDEO/LASER (DRAPES) IMPLANT
DRAPE MICROSCOPE LEICA (MISCELLANEOUS) ×3 IMPLANT
DRAPE NEUROLOGICAL W/INCISE (DRAPES) ×3 IMPLANT
DRAPE STERI IOBAN 125X83 (DRAPES) IMPLANT
DRAPE SURG 17X23 STRL (DRAPES) IMPLANT
DRAPE WARM FLUID 44X44 (DRAPE) ×3 IMPLANT
DRSG TELFA 3X8 NADH (GAUZE/BANDAGES/DRESSINGS) ×3 IMPLANT
ELECT CAUTERY BLADE 6.4 (BLADE) ×3 IMPLANT
ELECT REM PT RETURN 9FT ADLT (ELECTROSURGICAL) ×3
ELECTRODE REM PT RTRN 9FT ADLT (ELECTROSURGICAL) ×1 IMPLANT
GAUZE SPONGE 4X4 12PLY STRL (GAUZE/BANDAGES/DRESSINGS) ×3 IMPLANT
GAUZE SPONGE 4X4 16PLY XRAY LF (GAUZE/BANDAGES/DRESSINGS) IMPLANT
GLOVE BIOGEL PI IND STRL 7.0 (GLOVE) ×2 IMPLANT
GLOVE BIOGEL PI IND STRL 8 (GLOVE) ×1 IMPLANT
GLOVE BIOGEL PI INDICATOR 7.0 (GLOVE) ×4
GLOVE BIOGEL PI INDICATOR 8 (GLOVE) ×2
GLOVE ECLIPSE 6.5 STRL STRAW (GLOVE) ×12 IMPLANT
GLOVE ECLIPSE 7.5 STRL STRAW (GLOVE) ×3 IMPLANT
GLOVE ECLIPSE 9.0 STRL (GLOVE) ×9 IMPLANT
GLOVE EXAM NITRILE LRG STRL (GLOVE) IMPLANT
GLOVE EXAM NITRILE MD LF STRL (GLOVE) IMPLANT
GLOVE EXAM NITRILE XL STR (GLOVE) IMPLANT
GLOVE EXAM NITRILE XS STR PU (GLOVE) IMPLANT
GOWN STRL REUS W/ TWL LRG LVL3 (GOWN DISPOSABLE) ×2 IMPLANT
GOWN STRL REUS W/ TWL XL LVL3 (GOWN DISPOSABLE) ×2 IMPLANT
GOWN STRL REUS W/TWL 2XL LVL3 (GOWN DISPOSABLE) IMPLANT
GOWN STRL REUS W/TWL LRG LVL3 (GOWN DISPOSABLE) ×4
GOWN STRL REUS W/TWL XL LVL3 (GOWN DISPOSABLE) ×4
HEMOSTAT SURGICEL 2X14 (HEMOSTASIS) ×3 IMPLANT
KIT BASIN OR (CUSTOM PROCEDURE TRAY) ×3 IMPLANT
KIT ROOM TURNOVER OR (KITS) ×3 IMPLANT
NEEDLE HYPO 18GX1.5 BLUNT FILL (NEEDLE) IMPLANT
NEEDLE HYPO 25X1 1.5 SAFETY (NEEDLE) ×3 IMPLANT
NS IRRIG 1000ML POUR BTL (IV SOLUTION) ×6 IMPLANT
PACK CRANIOTOMY (CUSTOM PROCEDURE TRAY) ×3 IMPLANT
PAD ARMBOARD 7.5X6 YLW CONV (MISCELLANEOUS) ×9 IMPLANT
PATTIES SURGICAL .25X.25 (GAUZE/BANDAGES/DRESSINGS) IMPLANT
PATTIES SURGICAL .5 X.5 (GAUZE/BANDAGES/DRESSINGS) IMPLANT
PATTIES SURGICAL .5 X3 (DISPOSABLE) IMPLANT
PATTIES SURGICAL 1X1 (DISPOSABLE) IMPLANT
PLATE 1.5  2HOLE LNG NEURO (Plate) ×2 IMPLANT
PLATE 1.5 2HOLE LNG NEURO (Plate) ×1 IMPLANT
PLATE 1.5/0.5 13MM BURR HOLE (Plate) ×3 IMPLANT
RUBBERBAND STERILE (MISCELLANEOUS) IMPLANT
SCREW SELF DRILL HT 1.5/4MM (Screw) ×18 IMPLANT
SPONGE NEURO XRAY DETECT 1X3 (DISPOSABLE) IMPLANT
SPONGE SURGIFOAM ABS GEL 100 (HEMOSTASIS) ×3 IMPLANT
STAPLER VISISTAT 35W (STAPLE) ×3 IMPLANT
SUT NURALON 4 0 TR CR/8 (SUTURE) ×9 IMPLANT
SUT VIC AB 2-0 CT2 18 VCP726D (SUTURE) ×6 IMPLANT
SYR CONTROL 10ML LL (SYRINGE) ×3 IMPLANT
TOWEL OR 17X24 6PK STRL BLUE (TOWEL DISPOSABLE) ×3 IMPLANT
TOWEL OR 17X26 10 PK STRL BLUE (TOWEL DISPOSABLE) ×3 IMPLANT
TRAY FOLEY CATH 14FRSI W/METER (CATHETERS) ×3 IMPLANT
UNDERPAD 30X30 INCONTINENT (UNDERPADS AND DIAPERS) ×3 IMPLANT
WATER STERILE IRR 1000ML POUR (IV SOLUTION) ×3 IMPLANT

## 2014-10-06 NOTE — Progress Notes (Signed)
Patient with a solitary left posterior frontal metastatic tumor. Patient with significant right upper extremity weakness otherwise doing well. Patient status post preoperative SRS treatment. Plan left sided craniotomy with intraoperative stereotaxis for resection of this tumor. Risks and benefits of been explained. Patient wishes to proceed.

## 2014-10-06 NOTE — Progress Notes (Signed)
PATIENT DETAILS Name: [name] [name] Age: 69 y.o. 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 He was found to have large metastatic lesion on his CT of his head. He was started on antiepileptics, Decadron. 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 Patient was seen in consultation by neurology, neurosurgery, and radiation oncology. Subsequently, patient underwent preoperative stereotactic radiosurgery, followed by craniotomy and resection on 10/06/14.  Subjective: Complains of pain at the operative site.  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 Presented with generalized tonic-clonic seizures. Started on antiepileptics, Decadron. After consultation with neurology, neurosurgery and radiation oncology, patient underwent preoperative stereotactic radiosurgery, and subsequently craniotomy and resection on 10/06/14. Await intraoperative biopsy and microbiology results. Continues to be on Decadron and antiepileptics.  Active Problems:   Seizure disorder: Presented with seizures, seen by neurology. Initially started on Keppra, subsequently Vimpat was added due to ongoing seizure activity. Thankfully no seizures since 12/29. 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    History of stage IIIB esophageal  cancer with metastases: Will need outpatient follow-up by [name]. Previously on systemic chemotherapy.    Acute respiratory failure with hypoxia: Suspect this was secondary to neurogenic (noncardiac) pulmonary edema from seizures. Resolved.    Anemia: Secondary to chronic disease and acute illness. Follow.    HTN: Controlled without the use of any antihypertensives currently. Continue prn IV labetalol.    History of DVT of right lower extremity: Diagnosed on 09/02/14, previously on xarelto prior to this admission. Started on heparin yesterday, currently off anticoagulation as underwent a craniotomy today. Resume anticoagulation at the discretion of neurosurgery.    History of atrial fibrillation: Maintaining sinus rhythm, resume anticoagulation when okay with neurosurgery. Metoprolol held earlier this admission due to soft BP, resume when able    Severe protein calorie malnutrition: Continue supplements  Disposition: Remain inpatient  Antibiotics:  See below   Anti-infectives    Start     Dose/Rate Route Frequency Ordered Stop   10/06/14 1400  ceFAZolin (ANCEF) IVPB 2 g/50 mL premix     2 g100 mL/hr over 30 Minutes Intravenous Every 8 hours 10/06/14 1111 10/07/14 0559   10/06/14 0851  bacitracin 50,000 Units in sodium chloride irrigation 0.9 % 500 mL irrigation  Status:  Discontinued       As needed 10/06/14 0852 10/06/14 0948      DVT Prophylaxis: None  Code Status: Full code   Family Communication None at bedside  Procedures:  Craniotomy 1/7  CONSULTS:  Neurology/Dr. Aram [name] Neurosurgery/Dr. Pool Oncology/[name] [name] onco/Dr. Tammi [name]  MEDICATIONS: Scheduled Meds: . azelastine  1 spray Each Nare BID  .  ceFAZolin (ANCEF) IV  2 g Intravenous Q8H  . dexamethasone  4 mg Oral 4 times per day  . feeding supplement (ENSURE COMPLETE)  237 mL Oral BID BM  . feeding supplement (PRO-STAT SUGAR FREE 64)  30 mL Oral BID WC  .  lacosamide  100 mg Oral BID  .  levETIRAcetam  1,500 mg Oral BID  . pantoprazole  40 mg Oral Q1200  . polyethylene glycol  17 g Oral Daily  . senna-docusate  2 tablet Oral BID  . tiotropium  18 mcg Inhalation Daily   Continuous Infusions: . 0.9 % NaCl with KCl 20 mEq / L 75 mL/hr at 10/06/14 1156   PRN Meds:.acetaminophen **OR** acetaminophen, alum & mag hydroxide-simeth, fentaNYL, HYDROcodone-acetaminophen, HYDROmorphone (DILAUDID) injection, labetalol, LORazepam, magic mouthwash, meperidine (DEMEROL) injection, ondansetron **OR** ondansetron (ZOFRAN) IV, oxyCODONE, promethazine, promethazine, sodium chloride    PHYSICAL EXAM: Vital signs in last 24 hours: Filed Vitals:   10/06/14 1230 10/06/14 1300 10/06/14 1400 10/06/14 1500  BP:  112/64 111/67 127/65  Pulse: 72 79 71 72  Temp:      TempSrc:      Resp: 14 17 10 10   Height:      Weight:      SpO2: 100% 99% 100% 100%    Weight change:  Filed Weights   09/25/14 1535 09/28/14 1226 10/06/14 1100  Weight: 86.1 kg (189 lb 13.1 oz) 85.73 kg (189 lb) 83.9 kg (184 lb 15.5 oz)   Body mass index is 28.96 kg/(m^2).   Gen Exam: Awake and alert-slightly lethargic with clear speech.   Neck: Supple, No JVD.   Chest: B/L Clear.  CVS: S1 S2 Regular, no murmurs. Abdomen: soft, BS +, non tender, non distended.  Extremities: no edema, lower extremities warm to touch. Neurologic: RUE 0/5, Moves right lower ext side to side   Skin: No Rash.    Intake/Output from previous day:  Intake/Output Summary (Last 24 hours) at 10/06/14 1533 Last data filed at 10/06/14 1400  Gross per 24 hour  Intake   1655 ml  Output   1650 ml  Net      5 ml     LAB RESULTS: CBC  Recent Labs Lab 09/30/14 0440 10/02/14 0625 10/05/14 0450 10/06/14 0630  WBC 14.5* 13.8* 13.5* 13.2*  HGB 8.3* 8.6* 8.0* 8.1*  HCT 25.1* 26.5* 23.6* 24.4*  PLT 236 241 222 242  MCV 94.4 95.3 95.5 94.2  MCH 31.2 30.9 32.4 31.3  MCHC 33.1 32.5 33.9 33.2  RDW 19.7* 18.8* 18.8* 18.9*    Chemistries    Recent Labs Lab 10/04/14 0442 10/06/14 0630  NA 132* 131*  K 4.4 4.5  CL 99 102  CO2 26 26  GLUCOSE 134* 147*  BUN 37* 34*  CREATININE 0.96 0.96  CALCIUM 8.3* 8.2*    CBG: No results for input(s): GLUCAP in the last 168 hours.  GFR Estimated Creatinine Clearance: 76.3 mL/min (by C-G formula based on Cr of 0.96).  Coagulation profile No results for input(s): INR, PROTIME in the last 168 hours.  Cardiac Enzymes No results for input(s): CKMB, TROPONINI, MYOGLOBIN in the last 168 hours.  Invalid input(s): CK  Invalid input(s): POCBNP No results for input(s): DDIMER in the last 72 hours. No results for input(s): HGBA1C in the last 72 hours. No results for input(s): CHOL, HDL, LDLCALC, TRIG, CHOLHDL, LDLDIRECT in the last 72 hours. No results for input(s): TSH, T4TOTAL, T3FREE, THYROIDAB in the last 72 hours.  Invalid input(s): FREET3 No results for input(s): VITAMINB12, FOLATE, FERRITIN, TIBC, IRON, RETICCTPCT in the last 72 hours. No results for input(s): LIPASE, AMYLASE in the last 72 hours.  Urine Studies No results for input(s): UHGB, CRYS in the last 72 hours.  Invalid input(s): UACOL, UAPR, USPG, UPH, UTP,  UGL, UKET, UBIL, UNIT, UROB, ULEU, UEPI, UWBC, URBC, UBAC, CAST, UCOM, BILUA  MICROBIOLOGY: Recent Results (from the past 240 hour(s))  MRSA PCR Screening     Status: None   Collection Time: 10/06/14  5:43 AM  Result Value Ref Range Status   MRSA by PCR NEGATIVE NEGATIVE Final    Comment:        The GeneXpert MRSA Assay (FDA approved for NASAL specimens only), is one component of a comprehensive MRSA colonization surveillance program. It is not intended to diagnose MRSA infection nor to guide or monitor treatment for MRSA infections.   Gram stain     Status: None   Collection Time: 10/06/14  9:10 AM  Result Value Ref Range Status   Specimen Description ABSCESS  Final   Special Requests BRAIN TUMOR  Final   Gram Stain   Final    ABUNDANT WBC  PRESENT,BOTH PMN AND MONONUCLEAR ABUNDANT GRAM POSITIVE COCCI IN PAIRS IN CHAINS    Report Status 10/06/2014 FINAL  Final    RADIOLOGY STUDIES/RESULTS: Dg Chest 2 View  09/14/2014   CLINICAL DATA:  Recent falls due to weakness. History of lung cancer.  EXAM: CHEST  2 VIEW  COMPARISON:  Chest CT 06/1914  FINDINGS: Port-A-Cath tip in the lower SVC region. Chronic changes at the lung bases compatible with bronchiectasis. New densities in the right mid lung are suggestive for fluid or densities in the right minor fissure. Heart size is normal. Negative for pneumothorax. Bony structures appear to be intact.  IMPRESSION: There is new fluid or densities in the right minor fissure region.  Chronic changes and bronchiectasis at the lung bases.   Electronically Signed   By: Markus Daft M.D.   On: 09/14/2014 20:18   Ct Head Wo Contrast  09/25/2014   CLINICAL DATA:  69 year old with code stroke. Right-sided weakness. History of esophageal cancer.  EXAM: CT HEAD WITHOUT CONTRAST  TECHNIQUE: Contiguous axial images were obtained from the base of the skull through the vertex without contrast.  COMPARISON:  None  FINDINGS: There is focal low density in the posterior left frontal lobe which is consistent with vasogenic edema and there appears to be an underlying lesion in this area. The lesion roughly measures 1.4 cm but poorly characterized on this non contrast examination. Findings are concerning for metastatic disease based on the history of esophagus cancer. There is no significant midline shift. There is no evidence for acute hemorrhage, hydrocephalus or definite infarct.  No sinuses are clear.  No acute bone abnormality.  IMPRESSION: There is a focus of vasogenic edema in the left frontal lobe. Findings are concerning for a brain lesion and likely represent metastatic disease. There is no evidence for acute hemorrhage, hydrocephalus or midline shift. Recommend further characterization with MRI.  Critical  Value/emergent results were called by telephone at the time of interpretation on 09/25/2014 at 12:48 pm to Dr. Leonel [name], who verbally acknowledged these results.   Electronically Signed   By: Markus Daft M.D.   On: 09/25/2014 12:51   Mr Jeri Cos UR Contrast  09/28/2014   CLINICAL DATA:  Frequent falls. Personal history of esophageal cancer treated with chemotherapy, ongoing. Right arm twitching. Possible seizure. Abnormal head CT. Abnormal MRI 3 days ago.  EXAM: MRI HEAD WITHOUT AND WITH CONTRAST  TECHNIQUE: Multiplanar, multiecho pulse sequences of the brain and surrounding structures were obtained without and with intravenous contrast.  CONTRAST:  102mL MULTIHANCE GADOBENATE DIMEGLUMINE 529 MG/ML IV SOLN  COMPARISON:  MRI  09/25/2014.  CT 09/25/2014.  FINDINGS: There has been progression of a peripherally enhancing brain lesion in the left posterior frontal vertex. Outer measurements of the enhancing region have increased from 2 x 1.3 x 1.6 cm to 2.4 x 1.9 x 1.4 cm. Vasogenic edema has increased slightly. There is central restricted diffusion. These characteristics raise the possibility of brain abscess in addition to the possibility of rapidly progressive metastatic lesion. No second lesion is seen. The brain does not show any evidence of old or acute infarction. No pituitary mass. No inflammatory sinus disease. No hydrocephalus. No extra-axial collection. No skull or skullbase lesion.  IMPRESSION: Progression/enlargement of the left posterior frontal cortical lesion, increased in size and showing more vasogenic edema. Imaging characteristics raise concern for the possibility of brain abscess. The differential diagnosis would be that of rapidly progressive solitary metastatic lesion.   Electronically Signed   By: Nelson Chimes M.D.   On: 09/28/2014 09:43   Mr Jeri Cos RK Contrast  09/26/2014   CLINICAL DATA:  New onset seizure. History of esophageal cancer. Initial encounter.  EXAM: MRI HEAD WITHOUT AND WITH  CONTRAST  TECHNIQUE: Multiplanar, multiecho pulse sequences of the brain and surrounding structures were obtained without and with intravenous contrast.  CONTRAST:  65mL MULTIHANCE GADOBENATE DIMEGLUMINE 529 MG/ML IV SOLN  COMPARISON:  Prior CT from earlier the same day.  FINDINGS: There is an irregular enhancing mass that measures 1.3 x 1.6 x 2.0 cm within the high left posterior left frontal region (series 14, image 19). Heterogeneous T2 signal intensity with restricted diffusion intensity within this lesion suggests central necrosis. There There is associated vasogenic edema within the left frontoparietal region. Finding concerning for possible metastatic disease. No other lesions identified within the brain. No other abnormal enhancement.  Mild T2/FLAIR hyperintensity within the periventricular and deep white matter both cerebral hemispheres noted, nonspecific, but likely related to mild chronic small vessel ischemic disease.  No abnormal foci of restricted diffusion to suggest acute intracranial infarct identified. Gray-white matter differentiation otherwise maintained. Normal intravascular flow voids present.  There is no midline shift. No hydrocephalus. No extra-axial fluid collection.  Craniocervical junction is within normal limits. Scattered degenerative changes present within the visualized upper cervical spine. There appears to be associated a least moderate to severe central canal stenosis at the C3-4 level, incompletely evaluated on this exam (series 2, image 13).  Pituitary gland within normal limits.  No acute abnormality seen about the orbits.  Paranasal sinuses and mastoid air cells are clear.  No acute abnormalities cm within the scalp soft tissues.  IMPRESSION: 1. 1.3 x 1.6 x 2.0 cm heterogeneously enhancing mass within the high posterior left frontal region, most concerning for metastatic disease given the history of esophageal carcinoma. No other intracranial lesions or abnormal enhancement  identified. 2. No other acute intracranial process. 3. Degenerative changes within the upper cervical spine with associated severe canal stenosis at C3-4, incompletely evaluated on this exam. Further evaluation with MRI the cervical spine could be performed as clinically desired.   Electronically Signed   By: Jeannine Boga M.D.   On: 09/26/2014 00:44   Dg Chest Port 1 View  09/28/2014   CLINICAL DATA:  Hypoxic this am.  Weakness.  EXAM: PORTABLE CHEST - 1 VIEW  COMPARISON:  09/25/2014; 09/14/2014; chest CT -07/18/2014  FINDINGS: Grossly unchanged borderline enlarged cardiac silhouette and mediastinal contours given slightly reduced lung volumes. Stable position of support apparatus. No pneumothorax. The pulmonary vasculature is less distinct on present  examination with cephalization of flow. Interval development of small bilateral effusions with associated worsening bibasilar opacities. No pneumothorax. Unchanged bones. Radiopaque material is seen within the gastric fundus.  IMPRESSION: Findings most suggestive of pulmonary edema with worsening bibasilar opacities, atelectasis versus infiltrate. A follow-up chest radiograph in 4 to 6 weeks after treatment is recommended to ensure resolution.   Electronically Signed   By: Sandi Mariscal M.D.   On: 09/28/2014 08:11   Dg Chest Port 1 View  09/25/2014   CLINICAL DATA:  Seizures.  EXAM: PORTABLE CHEST - 1 VIEW  COMPARISON:  09/14/2014  FINDINGS: Right IJ Port-A-Cath is unchanged with tip in the region of the cavoatrial junction. Lungs are adequately inflated with stable focal opacification over the right midlung which may represent fluid within the fissure. Hazy left base opacification as cannot exclude small effusion with atelectasis versus infection. Mild stable cardiomegaly. Remainder the exam is unchanged.  IMPRESSION: Left base opacification which may represent a small effusion with atelectasis although cannot exclude infection. Stable focal  opacification over the right mid lung likely fluid within the fissure.  Stable cardiomegaly.   Electronically Signed   By: Marin Olp M.D.   On: 09/25/2014 13:59   Dg Swallowing Func-speech Pathology  09/27/2014   [name], [name] [name]     09/27/2014  2:35 PM Objective Swallowing Evaluation: Modified Barium Swallowing Study   Patient Details  Name: [name] [name] MRN: [id number] Date of Birth: 08/09/1946  Today's Date: 09/27/2014 Time: 1330-1400 SLP Time Calculation (min) (ACUTE ONLY): 30 min  Past Medical History:  Past Medical History  Diagnosis Date  . GERD (gastroesophageal reflux disease)   . Hyperlipemia   . Barrett's esophagus 12/2013  . History of radiation therapy 02/23/14- 04/04/14    esophagus 5040 cGy 28 sessions  . 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 Hypertension    Past Surgical History:  Past Surgical History  Procedure Laterality Date  . Eus N/A 02/03/2014    Procedure: UPPER ENDOSCOPIC ULTRASOUND (EUS) LINEAR;  Surgeon:  Milus Banister, MD;  Location: [facility];  Service:  Endoscopy;  Laterality: N/A;   HPI:  [name] [name] is a 69 y.o. male with pmh of esophageal cancer  status post previous radiation treatments and ongoing  chemotherapy who presented to the emergency room and was just  discharged from the hospitalist service 6 days ago for diastolic  heart failure and frequent falls. He was brought in today by EMS  from his skilled nursing facility with some right arm and facial  twitching. There are concerns about seizure activity and while  being transported, pt had a witnessed generalized tonic-clonic  seizure lasting 3-4 minutes. A MRI scan of the head was  concerning for heterogeneously enhancing mass within the high  posterior left frontal regionfelt to be metastatic disease from  patient's esophageal cancer (lower third), and degenerative  changes within the upper cervical spine with associated severe  canal stenosis at C3-4     Assessment / Plan / Recommendation  Clinical Impression  Dysphagia Diagnosis: Mild pharyngeal phase dysphagia Clinical impression: Pt demonstrates a mild pharyngeal phase  dysphagia characterized by sensory deficits resulting in delayed  swallow initiation and silent aspiration of mixed consistencies  (thin and barium tablet). No aspiration noted across trials of  thin, puree, or regular textures when consumed individually.  Pt  has a hx of GERD, esophageal cancer, and radiation treatments.  Suspect that a lifetime of reflux may be playing a role in  reduced sensation leading to the above deficits. Recommend  dysphagia 2 (chopped) diet, due to pt's preference for softer  foods, and thin liquids. Medications to be given whole in puree.  SLP will follow briefly to ensure diet tolerance.    Treatment Recommendation  Therapy as outlined in treatment plan below    Diet Recommendation Dysphagia 2 (Fine chop);Thin liquid   Liquid Administration via: Cup;Straw Medication Administration: Whole meds with puree Supervision: Staff to assist with self feeding Compensations: Slow rate;Small sips/bites Postural Changes and/or Swallow Maneuvers: Seated upright 90  degrees;Upright 30-60 min after meal    Other  Recommendations Oral Care Recommendations: Oral care BID   Follow Up Recommendations  24 hour supervision/assistance    Frequency and Duration min 2x/week  2 weeks   Pertinent Vitals/Pain none    SLP Swallow Goals     General Date of Onset: 09/25/14 HPI: Verbon Giangregorio is a 69 y.o. male with pmh of esophageal  cancer status post previous radiation treatments and ongoing  chemotherapy who presented to the emergency room and was just  discharged from the hospitalist service 6 days ago for diastolic  heart failure and frequent falls. He was brought in today by EMS  from his skilled nursing facility with some right arm and facial  twitching. There are concerns about seizure activity and while  being transported, pt had a witnessed generalized tonic-clonic  seizure  lasting 3-4 minutes. A MRI scan of the head was  concerning for heterogeneously enhancing mass within the high  posterior left frontal regionfelt to be metastatic disease from  patient's esophageal cancer (lower third), and degenerative  changes within the upper cervical spine with associated severe  canal stenosis at C3-4 Type of Study: Modified Barium Swallowing Study Reason for Referral: Objectively evaluate swallowing function Previous Swallow Assessment: BSE, 12/29 Diet Prior to this Study: Dysphagia 2 (chopped);Thin liquids Temperature Spikes Noted: No Respiratory Status: Nasal cannula History of Recent Intubation: No Behavior/Cognition: Alert;Cooperative;Pleasant mood;Requires  cueing Oral Cavity - Dentition: Adequate natural dentition Oral Motor / Sensory Function: Within functional limits Self-Feeding Abilities: Able to feed self;Needs assist Patient Positioning: Upright in chair Baseline Vocal Quality: Hoarse Volitional Cough: Strong Volitional Swallow: Able to elicit Anatomy: Within functional limits Pharyngeal Secretions: Not observed secondary MBS    Reason for Referral Objectively evaluate swallowing function   Oral Phase Oral Preparation/Oral Phase Oral Phase: WFL   Pharyngeal Phase Pharyngeal Phase Pharyngeal Phase: Impaired Pharyngeal - Thin Pharyngeal - Thin Cup: Delayed swallow initiation;Premature  spillage to pyriform sinuses Pharyngeal - Thin Straw: Delayed swallow initiation;Premature  spillage to pyriform sinuses Pharyngeal - Solids Pharyngeal - Puree: Within functional limits Pharyngeal - Regular: Within functional limits Pharyngeal - Pill: Delayed swallow initiation;Premature spillage  to valleculae;Penetration/Aspiration before swallow Penetration/Aspiration details (pill): Material enters airway,  passes BELOW cords without attempt by patient to eject out  (silent aspiration)  Cervical Esophageal Phase    GO    Cervical Esophageal Phase Cervical Esophageal Phase: Impaired Cervical  Esophageal Phase - Thin Thin Cup: Within functional limits Cervical Esophageal Phase - Solids Puree: Within functional limits Pill:  (appearance of stasis in the distal esophagus)         [name] 09/27/2014, 2:30 PM     Oren Binet, MD  [facility] Pager:[phone]  If 7PM-7AM, please contact night-coverage [email or web address] Password [email or web address] 10/06/2014, 3:33 PM   LOS: 11 days

## 2014-10-06 NOTE — Brief Op Note (Signed)
09/25/2014 - 10/06/2014  9:32 AM  PATIENT:  [name]  69 y.o. male  PRE-OPERATIVE DIAGNOSIS:  BRAIN TUMOR  POST-OPERATIVE DIAGNOSIS:  BRAIN TUMOR  PROCEDURE:  Procedure(s) with comments: CRANIOTOMY TUMOR EXCISION WITH CURVE (N/A) - CRANIOTOMY TUMOR EXCISION WITH CURVE  SURGEON:  Surgeon(s) and Role:    * [name], MD - Primary    * Hosie Spangle, MD - Assisting  PHYSICIAN ASSISTANT:   ASSISTANTS:    ANESTHESIA:   general  EBL:  Total I/O In: 250 [IV Piggyback:250] Out: -   BLOOD ADMINISTERED:none  DRAINS: none   LOCAL MEDICATIONS USED:  MARCAINE     SPECIMEN:  Source of Specimen:  Left posterior frontal mass  DISPOSITION OF SPECIMEN:  PATHOLOGY  COUNTS:  YES  TOURNIQUET:  * No tourniquets in log *  DICTATION: .Dragon Dictation  PLAN OF CARE: Admit to inpatient   PATIENT DISPOSITION:  PACU - hemodynamically stable.   Delay start of Pharmacological VTE agent (>24hrs) due to surgical blood loss or risk of bleeding: yes

## 2014-10-06 NOTE — Op Note (Signed)
Date of procedure: 10/06/2014  Date of dictation: Same  Service: Neurosurgery  Preoperative diagnosis: Left posterior frontal metastatic tumor  Postoperative diagnosis: Same, slight possibility of brain abscess  Procedure Name: Left craniotomy with resection of supra tentorial tumor utilizing intraoperative stereotaxis and microdissection  Surgeon:Akira Perusse A.Zaynab Chipman, M.D.  Asst. Surgeon: Sherwood Gambler  Anesthesia: General  Indication: 69 year old male with known esophageal cancer admitted with new onset focal motor seizures involving his right upper extremity and face. Seizures controlled medically the patient still with significant right upper extremity monoplegia. Workup demonstrates evidence of a ring-enhancing mass in his left posterior frontal region with some surrounding edema. Lesion most consistent with metastatic disease. Patient received preoperative SRS treatment and presents now for craniotomy and resection.  Operative note: After induction of anesthesia, patient position supine with neck slightly flexed and held in place in a [place] pin headrest. Stereotactic registration was performed into the [place] system. Planned incision site was confirmed. Scalp was prepped and draped. Incision made in the left frontal parietal region. Retractor placed. Trajectory again confirmed by stereotaxis. Craniotomy performed using high-speed drill. Bone flap removed. Dura opened in a cruciate fashion. Underlying brain observed. There was some discoloration overlying a frontal gyrus consistent with tumor. This was confirmed by the stereotactic system. Cortical incision was then made and the tumor was entered. A large amount of purulence/mucous was released under some pressure. Cultures were taken on the off chance that this was infectious although most likely this represented a mucinous tumor. The tumor was then circumferentially dissected using microdissection and resected in a gross total fashion. The  surrounding brain was inspected and found to be free of any residual tumor. The wound was irrigated. Culture and specimens were sent to pathology. Results are pending. Dura was reapproximated using 4-0 Nurolon. Bone flap was secured in place with titanium plates. Scalp was closed with 2-0 Vicryl suture at the galea and staples at the surface. No apparent complications. The patient tolerated the procedure well and he returns to the recovery room postop.

## 2014-10-06 NOTE — Transfer of Care (Signed)
Immediate Anesthesia Transfer of Care Note  Patient: [name] [name]  Procedure(s) Performed: Procedure(s) with comments: CRANIOTOMY TUMOR EXCISION WITH CURVE (N/A) - CRANIOTOMY TUMOR EXCISION WITH CURVE  Patient Location: PACU  Anesthesia Type:General  Level of Consciousness: awake and alert   Airway & Oxygen Therapy: Patient Spontanous Breathing and Patient connected to nasal cannula oxygen  Post-op Assessment: Report given to PACU RN and Post -op Vital signs reviewed and stable  Post vital signs: Reviewed and stable  Complications: No apparent anesthesia complications

## 2014-10-06 NOTE — Progress Notes (Signed)
Patient picked up for surgery

## 2014-10-06 NOTE — Anesthesia Procedure Notes (Signed)
Procedure Name: Intubation Date/Time: 10/06/2014 8:07 AM Performed by: [place] Pink Pre-anesthesia Checklist: Patient identified, Emergency Drugs available, Suction available, Patient being monitored and Timeout performed Patient Re-evaluated:Patient Re-evaluated prior to inductionOxygen Delivery Method: Circle system utilized Preoxygenation: Pre-oxygenation with 100% oxygen Intubation Type: IV induction Ventilation: Mask ventilation without difficulty Laryngoscope Size: Mac and 3 Grade View: Grade III Tube type: Oral Tube size: 7.5 mm Number of attempts: 1 Airway Equipment and Method: Stylet and LTA kit utilized Placement Confirmation: ETT inserted through vocal cords under direct vision,  positive ETCO2 and breath sounds checked- equal and bilateral Secured at: 23 cm Tube secured with: Tape Dental Injury: Teeth and Oropharynx as per pre-operative assessment

## 2014-10-06 NOTE — Progress Notes (Signed)
Pt arrived to 4N13 by carelink. Alert and oriented x4. Calm and cooperative. No c/o of pain. Oriented tor room and equipment. Pt placed on tele. Will continue to monitor.

## 2014-10-06 NOTE — Progress Notes (Signed)
UR completed.  Omarr Hann, RN BSN [facility] CCM Trauma/Neuro ICU Case Manager [phone]  

## 2014-10-06 NOTE — Anesthesia Postprocedure Evaluation (Signed)
  Anesthesia Post-op Note  Patient: [name]  Procedure(s) Performed: Procedure(s) with comments: CRANIOTOMY TUMOR EXCISION WITH CURVE (N/A) - CRANIOTOMY TUMOR EXCISION WITH CURVE  Patient Location: PACU  Anesthesia Type:General  Level of Consciousness: awake  Airway and Oxygen Therapy: Patient Spontanous Breathing  Post-op Pain: mild  Post-op Assessment: Post-op Vital signs reviewed and Patient's Cardiovascular Status Stable  Post-op Vital Signs: Reviewed and stable  Last Vitals:  Filed Vitals:   10/06/14 0951  BP: 118/67  Pulse: 88  Temp:   Resp: 19    Complications: No apparent anesthesia complications

## 2014-10-06 NOTE — Progress Notes (Addendum)
ANTIBIOTIC CONSULT NOTE - INITIAL  Pharmacy Consult:  Vancomycin Indication:  Brain abscess  No Known Allergies  Patient Measurements: Height: 5\' 7"  (170.2 cm) Weight: 184 lb 15.5 oz (83.9 kg) IBW/kg (Calculated) : 66.1  Vital Signs: Temp: 99 F (37.2 C) (01/07 1600) Temp Source: Axillary (01/07 1600) BP: 141/71 mmHg (01/07 1600) Pulse Rate: 70 (01/07 1600) Intake/Output from previous day: 01/06 0701 - 01/07 0700 In: 360 [P.O.:360] Out: 2375 [Urine:2375]  Labs:  Recent Labs  10/04/14 0442 10/05/14 0450 10/06/14 0630  WBC  --  13.5* 13.2*  HGB  --  8.0* 8.1*  PLT  --  222 242  CREATININE 0.96  --  0.96   Estimated Creatinine Clearance: 76.3 mL/min (by C-G formula based on Cr of 0.96). No results for input(s): VANCOTROUGH, VANCOPEAK, VANCORANDOM, GENTTROUGH, GENTPEAK, GENTRANDOM, TOBRATROUGH, TOBRAPEAK, TOBRARND, AMIKACINPEAK, AMIKACINTROU, AMIKACIN in the last 72 hours.   Microbiology: Recent Results (from the past 720 hour(s))  Clostridium Difficile by PCR     Status: None   Collection Time: 09/15/14  8:59 AM  Result Value Ref Range Status   C difficile by pcr NEGATIVE NEGATIVE Final    Comment: Performed at [facility]  MRSA PCR Screening     Status: None   Collection Time: 09/25/14  4:49 PM  Result Value Ref Range Status   MRSA by PCR NEGATIVE NEGATIVE Final    Comment:        The GeneXpert MRSA Assay (FDA approved for NASAL specimens only), is one component of a comprehensive MRSA colonization surveillance program. It is not intended to diagnose MRSA infection nor to guide or monitor treatment for MRSA infections.   MRSA PCR Screening     Status: None   Collection Time: 10/06/14  5:43 AM  Result Value Ref Range Status   MRSA by PCR NEGATIVE NEGATIVE Final    Comment:        The GeneXpert MRSA Assay (FDA approved for NASAL specimens only), is one component of a comprehensive MRSA colonization surveillance program. It is not intended to  diagnose MRSA infection nor to guide or monitor treatment for MRSA infections.   Gram stain     Status: None   Collection Time: 10/06/14  9:10 AM  Result Value Ref Range Status   Specimen Description ABSCESS  Final   Special Requests BRAIN TUMOR  Final   Gram Stain   Final    ABUNDANT WBC PRESENT,BOTH PMN AND MONONUCLEAR ABUNDANT GRAM POSITIVE COCCI IN PAIRS IN CHAINS    Report Status 10/06/2014 FINAL  Final    Medical History: Past Medical History  Diagnosis Date  . GERD (gastroesophageal reflux disease)   . Hyperlipemia   . Barrett's esophagus 12/2013  . History of radiation therapy 02/23/14- 04/04/14    esophagus 5040 cGy 28 sessions  . 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  Pharmacy consulted to initiate vancomycin for brain abscess.  He is also started on Rocephin and Flagyl.  Patient's renal function has been stable.  1/7 brain tumor abscess cx - GPC on Gram stain 1/7 wound cx -   Goal of Therapy:  Vancomycin trough level 15-20 mcg/ml   Plan:  - Vanc 1500mg  IV x 1, then 1gm IV Q12H - Continue Rocephin 2gm IV Q12H and Flagyl 500mg  IV Q6H per MD - Monitor renal fxn, clinical progress,  vanc trough at [facility]    Allahna Husband D. Mina Marble, PharmD, BCPS Pager:  [phone] 10/06/2014, 7:06 PM

## 2014-10-07 ENCOUNTER — Inpatient Hospital Stay (HOSPITAL_COMMUNITY): Payer: Medicare Other

## 2014-10-07 LAB — BASIC METABOLIC PANEL
Anion gap: 9 (ref 5–15)
BUN: 27 mg/dL — ABNORMAL HIGH (ref 6–23)
CO2: 20 mmol/L (ref 19–32)
Calcium: 8.2 mg/dL — ABNORMAL LOW (ref 8.4–10.5)
Chloride: 100 mEq/L (ref 96–112)
Creatinine, Ser: 0.8 mg/dL (ref 0.50–1.35)
GFR calc non Af Amer: 90 mL/min — ABNORMAL LOW (ref >90)
GLUCOSE: 114 mg/dL — AB (ref 70–99)
Potassium: 4.2 mmol/L (ref 3.5–5.1)
Sodium: 129 mmol/L — ABNORMAL LOW (ref 135–145)

## 2014-10-07 LAB — CBC
HCT: 23.7 % — ABNORMAL LOW (ref 39.0–52.0)
Hemoglobin: 8.1 g/dL — ABNORMAL LOW (ref 13.0–17.0)
MCH: 31.8 pg (ref 26.0–34.0)
MCHC: 34.2 g/dL (ref 30.0–36.0)
MCV: 92.9 fL (ref 78.0–100.0)
Platelets: 220 10*3/uL (ref 150–400)
RBC: 2.55 MIL/uL — AB (ref 4.22–5.81)
RDW: 18.9 % — AB (ref 11.5–15.5)
WBC: 18.8 10*3/uL — ABNORMAL HIGH (ref 4.0–10.5)

## 2014-10-07 MED ORDER — GADOBENATE DIMEGLUMINE 529 MG/ML IV SOLN
18.0000 mL | Freq: Once | INTRAVENOUS | Status: AC | PRN
  Administered 2014-10-07: 18 mL via INTRAVENOUS

## 2014-10-07 MED ORDER — POLYETHYLENE GLYCOL 3350 17 G PO PACK
17.0000 g | PACK | Freq: Every day | ORAL | Status: DC
  Administered 2014-10-07: 17 g via ORAL
  Filled 2014-10-07 (×4): qty 1

## 2014-10-07 NOTE — Clinical Social Work Note (Signed)
Clinical Social Worker continuing to follow patient and family for support and discharge planning needs.  CSW spoke with [employer] who states that patient is able to return once medically ready.  CSW remains available for support and to facilitate patient discharge needs once medically ready.  Barbette Or, [place]

## 2014-10-07 NOTE — Progress Notes (Signed)
Postop day 1. Patient denies headache. No new problems or complaints.  Awake and alert. Oriented and reasonably appropriate. Speech is fluent. Mild right-sided facial weakness which is stable. Right upper extremity plegia unchanged. Right lower extremity weakness slightly worse than preop but patient better than antigravity. Wound clean and dry.  Follow-up MRI scan demonstrates good resection of the lesion. Pathology currently favors brain abscess over neoplastic disease. Patient started on broad-spectrum antibiotics yesterday. Plan infectious disease consult today. Continue vancomycin and Rocephin and Flagyl. May mobilize.

## 2014-10-07 NOTE — Progress Notes (Signed)
OT Cancellation Note  Patient Details Name: [name] [name] MRN: [id number] DOB: 06/06/1946   Cancelled Treatment:    Reason Eval/Treat Not Completed:  Pt will need a new order to resume OT, s/p craniotomy.  Malka So 10/07/2014, 4:17 PM

## 2014-10-07 NOTE — Progress Notes (Addendum)
PATIENT DETAILS Name: [name] [name] Age: 69 y.o. 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 He was found to have large metastatic lesion on his CT of his head. He was started on antiepileptics, Decadron. 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 Patient was seen in consultation by neurology, neurosurgery, and radiation oncology. Subsequently, patient underwent preoperative stereotactic radiosurgery by Dr Trenton Gammon, followed by craniotomy and resection on 10/06/14.  Subjective: No complaints.   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 Presented with generalized tonic-clonic seizures. Started on antiepileptics, Decadron. After consultation with neurology, neurosurgery and radiation oncology, patient underwent preoperative stereotactic radiosurgery, and subsequently craniotomy and resection on 10/06/14. Pathology favors abscess over neoplasm and Rocephin and Flagyl started on 1/7. Futher management per NS.  Active Problems:   Seizure disorder: Presented with seizures, seen by neurology. Initially started on Keppra, subsequently Vimpat was added due to ongoing seizure activity. No seizures since 12/29. 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    History of stage IIIB esophageal cancer with metastases:  Will need outpatient follow-up by [name]. Previously on systemic chemotherapy.    Acute respiratory failure with hypoxia: Suspect this was secondary to neurogenic (noncardiac) pulmonary edema from seizures. Resolved.    Anemia: Secondary to chronic disease and acute illness. Follow.    HTN: currently hypotensive .    History of DVT of right lower extremity: Diagnosed on 09/02/14, previously on xarelto prior to this admission. Started on heparin yesterday, currently off anticoagulation as underwent a craniotomy today. Resume anticoagulation at the discretion of neurosurgery.    History of atrial fibrillation: Maintaining sinus rhythm, resume anticoagulation after a total of 5 days after carniotomy- will need to discuss again with Dr Trenton Gammon prior to starting- have discussed with Dr Arnoldo Morale today. - Metoprolol held earlier this admission due to soft BP, resume when able    Severe protein calorie malnutrition: Continue supplements  Disposition: Remain inpatient  Antibiotics:  See below   Anti-infectives    Start     Dose/Rate Route Frequency Ordered Stop   10/07/14 0800  vancomycin (VANCOCIN) IVPB 1000 mg/200 mL premix     1,000 mg200 mL/hr over 60 Minutes Intravenous Every 12 hours 10/06/14 1908     10/06/14 2200  metroNIDAZOLE (FLAGYL) IVPB 500 mg     500 mg100 mL/hr over 60 Minutes Intravenous Every 6 hours 10/06/14 1853     10/06/14 2000  cefTRIAXone (ROCEPHIN) 2 g in dextrose 5 % 50 mL IVPB - Premix     2 g100 mL/hr over 30 Minutes Intravenous Every 12 hours 10/06/14 1853     10/06/14 1915  vancomycin (VANCOCIN) 1,500 mg in sodium chloride 0.9 % 500 mL IVPB     1,500 mg250 mL/hr over 120 Minutes Intravenous  Once 10/06/14 1908 10/06/14 2215   10/06/14 1400  ceFAZolin (ANCEF) IVPB 2 g/50 mL premix  Status:  Discontinued  2 g100 mL/hr over 30 Minutes Intravenous Every 8 hours 10/06/14 1111 10/06/14 1853   10/06/14 0851  bacitracin 50,000 Units in sodium chloride irrigation  0.9 % 500 mL irrigation  Status:  Discontinued       As needed 10/06/14 0852 10/06/14 0948      DVT Prophylaxis: None  Code Status: Full code   Family Communication None at bedside  Procedures:  Craniotomy 1/7  CONSULTS:  Neurology/[name] Neurosurgery/Dr. Pool Oncology/[name] Holter onco/[name]  MEDICATIONS: Scheduled Meds: . azelastine  1 spray Each Nare BID  . cefTRIAXone (ROCEPHIN)  IV  2 g Intravenous Q12H  . dexamethasone  4 mg Oral 4 times per day  . feeding supplement (ENSURE COMPLETE)  237 mL Oral BID BM  . feeding supplement (PRO-STAT SUGAR FREE 64)  30 mL Oral BID WC  . lacosamide  100 mg Oral BID  . levETIRAcetam  1,500 mg Oral BID  . metronidazole  500 mg Intravenous Q6H  . pantoprazole  40 mg Oral Q1200  . polyethylene glycol  17 g Oral Daily  . polyethylene glycol  17 g Oral Daily  . senna-docusate  2 tablet Oral BID  . tiotropium  18 mcg Inhalation Daily  . vancomycin  1,000 mg Intravenous Q12H   Continuous Infusions: . 0.9 % NaCl with KCl 20 mEq / L 75 mL/hr at 10/07/14 0700   PRN Meds:.acetaminophen **OR** acetaminophen, albuterol, fentaNYL, HYDROmorphone (DILAUDID) injection, labetalol, LORazepam, magic mouthwash, meperidine (DEMEROL) injection, ondansetron **OR** ondansetron (ZOFRAN) IV, oxyCODONE, promethazine, promethazine, sodium chloride    PHYSICAL EXAM: Vital signs in last 24 hours: Filed Vitals:   10/07/14 0754 10/07/14 0800 10/07/14 0900 10/07/14 1130  BP:  103/64 98/54   Pulse:  78 83   Temp: 98 F (36.7 C)   97.8 F (36.6 C)  TempSrc: Oral   Oral  Resp:  13 22   Height:      Weight:      SpO2:  99% 99%     Weight change:  Filed Weights   09/25/14 1535 09/28/14 1226 10/06/14 1100  Weight: 86.1 kg (189 lb 13.1 oz) 85.73 kg (189 lb) 83.9 kg (184 lb 15.5 oz)   Body mass index is 28.96 kg/(m^2).   Gen Exam: Awake and alert-no acute distress Neck: Supple, No JVD.   Chest: B/L Clear.  CVS: S1 S2 Regular, no  murmurs. Abdomen: soft, BS +, non tender, non distended.  Extremities: no edema, lower extremities warm to touch. Neuro: no movement in right arm, 1/5 strength in right leg Skin: No Rash.    Intake/Output from previous day:  Intake/Output Summary (Last 24 hours) at 10/07/14 1532 Last data filed at 10/07/14 0700  Gross per 24 hour  Intake   2055 ml  Output   1750 ml  Net    305 ml     LAB RESULTS: CBC  Recent Labs Lab 10/02/14 0625 10/05/14 0450 10/06/14 0630 10/07/14 0525  WBC 13.8* 13.5* 13.2* 18.8*  HGB 8.6* 8.0* 8.1* 8.1*  HCT 26.5* 23.6* 24.4* 23.7*  PLT 241 222 242 220  MCV 95.3 95.5 94.2 92.9  MCH 30.9 32.4 31.3 31.8  MCHC 32.5 33.9 33.2 34.2  RDW 18.8* 18.8* 18.9* 18.9*    Chemistries   Recent Labs Lab 10/04/14 0442 10/06/14 0630 10/07/14 0525  NA 132* 131* 129*  K 4.4 4.5 4.2  CL 99 102 100  CO2 26 26 20   GLUCOSE 134* 147* 114*  BUN 37* 34* 27*  CREATININE 0.96 0.96 0.80  CALCIUM 8.3* 8.2* 8.2*    CBG: No results for input(s): GLUCAP in the last 168 hours.  GFR Estimated Creatinine Clearance: 91.5 mL/min (by C-G formula based on Cr of 0.8).  Coagulation profile No results for input(s): INR, PROTIME in the last 168 hours.  Cardiac Enzymes No results for input(s): CKMB, TROPONINI, MYOGLOBIN in the last 168 hours.  Invalid input(s): CK  Invalid input(s): POCBNP No results for input(s): DDIMER in the last 72 hours. No results for input(s): HGBA1C in the last 72 hours. No results for input(s): CHOL, HDL, LDLCALC, TRIG, CHOLHDL, LDLDIRECT in the last 72 hours. No results for input(s): TSH, T4TOTAL, T3FREE, THYROIDAB in the last 72 hours.  Invalid input(s): FREET3 No results for input(s): VITAMINB12, FOLATE, FERRITIN, TIBC, IRON, RETICCTPCT in the last 72 hours. No results for input(s): LIPASE, AMYLASE in the last 72 hours.  Urine Studies No results for input(s): UHGB, CRYS in the last 72 hours.  Invalid input(s): UACOL, UAPR, USPG,  UPH, UTP, UGL, UKET, UBIL, UNIT, UROB, ULEU, UEPI, UWBC, URBC, UBAC, CAST, UCOM, BILUA  MICROBIOLOGY: Recent Results (from the past 240 hour(s))  MRSA PCR Screening     Status: None   Collection Time: 10/06/14  5:43 AM  Result Value Ref Range Status   MRSA by PCR NEGATIVE NEGATIVE Final    Comment:        The GeneXpert MRSA Assay (FDA approved for NASAL specimens only), is one component of a comprehensive MRSA colonization surveillance program. It is not intended to diagnose MRSA infection nor to guide or monitor treatment for MRSA infections.   Culture, routine-abscess     Status: None (Preliminary result)   Collection Time: 10/06/14  9:10 AM  Result Value Ref Range Status   Specimen Description ABSCESS  Final   Special Requests BRAIN TUMOR  Final   Gram Stain   Final    ABUNDANT WBC PRESENT,BOTH PMN AND MONONUCLEAR ABUNDANT GRAM POSITIVE COCCI IN PAIRS AND CHAINS Performed at [facility] Performed at [facility]    Culture   Final    NO GROWTH 1 DAY Performed at [facility]    Report Status PENDING  Incomplete  Gram stain     Status: None   Collection Time: 10/06/14  9:10 AM  Result Value Ref Range Status   Specimen Description ABSCESS  Final   Special Requests BRAIN TUMOR  Final   Gram Stain   Final    ABUNDANT WBC PRESENT,BOTH PMN AND MONONUCLEAR ABUNDANT GRAM POSITIVE COCCI IN PAIRS IN CHAINS    Report Status 10/06/2014 FINAL  Final    RADIOLOGY STUDIES/RESULTS: Violeta Gelinas, MD  [facility]  If 7PM-7AM, please contact night-coverage [email or web address] Password [email or web address] 10/07/2014, 3:32 PM   LOS: 12 days

## 2014-10-07 NOTE — Progress Notes (Signed)
Unable to get arterial line reading; unable to flush line. RT notified and line was clotted off. Line D/C'd

## 2014-10-07 NOTE — Evaluation (Signed)
Physical Therapy Evaluation Patient Details Name: [name] [name] MRN: [id number] DOB: Dec 30, 1945 Today's Date: 10/07/2014   History of Present Illness  pt presents with L Frontal Brain mets and is now post crani with tumor excision.    Clinical Impression  Pt requires extensive A for all aspects of mobility.  Pt only demos active movement in L UE and trace in Bil LEs.  Sensation appears intact and PROM functional.  Discussed with RN that pt will need lift for any mobility OOB.  Noted pt was at SNF for rehab prior to admit and will need to return to SNF level of care at D/C.      Follow Up Recommendations SNF    Equipment Recommendations  None recommended by PT    Recommendations for Other Services       Precautions / Restrictions Precautions Precautions: Fall Restrictions Weight Bearing Restrictions: No      Mobility  Bed Mobility Overal bed mobility: Needs Assistance;+2 for physical assistance Bed Mobility: Supine to Sit;Sit to Supine     Supine to sit: Total assist;+2 for physical assistance;HOB elevated Sit to supine: Total assist;+2 for physical assistance   General bed mobility comments: pt unable to participate in mobility.    Transfers                    Ambulation/Gait                Stairs            Wheelchair Mobility    Modified Rankin (Stroke Patients Only)       Balance Overall balance assessment: Needs assistance Sitting-balance support: Single extremity supported;Feet supported Sitting balance-Leahy Scale: Zero Sitting balance - Comments: pt leans posteriorly and to R side and no effort to correct balance.  Worked on reaching with L UE trying to activate trunk.   Postural control: Posterior lean;Right lateral lean                                   Pertinent Vitals/Pain Pain Assessment: No/denies pain    Home Living Family/patient expects to be discharged to:: Skilled nursing facility                      Prior Function           Comments: pt had been working with PT at [facility].       Hand Dominance   Dominant Hand: Right    Extremity/Trunk Assessment   Upper Extremity Assessment: Defer to OT evaluation           Lower Extremity Assessment: RLE deficits/detail;LLE deficits/detail RLE Deficits / Details: Only active movement noted to be trace strength with dorsiflexion and plantar flexion.   LLE Deficits / Details: Hamstrings with strength 2/5, though no other active movements noted in LE.    Cervical / Trunk Assessment: Kyphotic  Communication   Communication: No difficulties  Cognition Arousal/Alertness: Awake/alert Behavior During Therapy: Flat affect Overall Cognitive Status: Impaired/Different from baseline Area of Impairment: Orientation;Attention;Memory;Following commands;Safety/judgement;Awareness;Problem solving Orientation Level: Disoriented to;Time Current Attention Level: Selective Memory: Decreased short-term memory Following Commands: Follows one step commands with increased time Safety/Judgement: Decreased awareness of safety;Decreased awareness of deficits Awareness: Emergent Problem Solving: Decreased initiation;Difficulty sequencing;Requires verbal cues;Requires tactile cues      General Comments      Exercises        Assessment/Plan  PT Assessment Patient needs continued PT services  PT Diagnosis Generalized weakness   PT Problem List Decreased strength;Decreased activity tolerance;Decreased mobility;Decreased balance;Decreased coordination;Decreased cognition;Decreased safety awareness;Decreased knowledge of use of DME  PT Treatment Interventions Functional mobility training;Therapeutic activities;Therapeutic exercise;Balance training;Neuromuscular re-education;Cognitive remediation;Patient/family education   PT Goals (Current goals can be found in the Care Plan section) Acute Rehab PT Goals Patient Stated Goal: To go back home. PT  Goal Formulation: With patient Time For Goal Achievement: 10/21/14 Potential to Achieve Goals: Fair    Frequency Min 2X/week   Barriers to discharge        Co-evaluation               End of Session   Activity Tolerance: Patient limited by fatigue Patient left: in chair;with call bell/phone within reach Nurse Communication: Mobility status;Need for lift equipment         Time: [id number] PT Time Calculation (min) (ACUTE ONLY): 22 min   Charges:   PT Evaluation $Initial PT Evaluation Tier I: 1 Procedure PT Treatments $Therapeutic Activity: 8-22 mins   PT G CodesCatarina Hartshorn, [place] [phone] 10/07/2014, 11:18 AM

## 2014-10-08 DIAGNOSIS — I481 Persistent atrial fibrillation: Secondary | ICD-10-CM

## 2014-10-08 LAB — BASIC METABOLIC PANEL
Anion gap: 6 (ref 5–15)
BUN: 22 mg/dL (ref 6–23)
CO2: 20 mmol/L (ref 19–32)
CREATININE: 0.86 mg/dL (ref 0.50–1.35)
Calcium: 8.2 mg/dL — ABNORMAL LOW (ref 8.4–10.5)
Chloride: 104 mEq/L (ref 96–112)
GFR calc Af Amer: >90 mL/min (ref >90)
GFR calc non Af Amer: 87 mL/min — ABNORMAL LOW (ref >90)
GLUCOSE: 186 mg/dL — AB (ref 70–99)
Potassium: 4.7 mmol/L (ref 3.5–5.1)
Sodium: 130 mmol/L — ABNORMAL LOW (ref 135–145)

## 2014-10-08 LAB — CBC
HCT: 22.7 % — ABNORMAL LOW (ref 39.0–52.0)
HEMOGLOBIN: 7.6 g/dL — AB (ref 13.0–17.0)
MCH: 31.3 pg (ref 26.0–34.0)
MCHC: 33.5 g/dL (ref 30.0–36.0)
MCV: 93.4 fL (ref 78.0–100.0)
Platelets: 222 10*3/uL (ref 150–400)
RBC: 2.43 MIL/uL — ABNORMAL LOW (ref 4.22–5.81)
RDW: 19.2 % — ABNORMAL HIGH (ref 11.5–15.5)
WBC: 18.5 10*3/uL — ABNORMAL HIGH (ref 4.0–10.5)

## 2014-10-08 MED ORDER — SODIUM CHLORIDE 0.9 % IV BOLUS (SEPSIS)
500.0000 mL | Freq: Once | INTRAVENOUS | Status: AC
  Administered 2014-10-08: 500 mL via INTRAVENOUS

## 2014-10-08 NOTE — Progress Notes (Signed)
PATIENT DETAILS Name: [name] [name] Age: 69 y.o. 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 He was found to have large metastatic lesion on his CT of his head. He was started on antiepileptics, Decadron. 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 Patient was seen in consultation by neurology, neurosurgery, and radiation oncology. Subsequently, patient underwent preoperative stereotactic radiosurgery by Dr Trenton Gammon, followed by craniotomy and resection on 10/06/14.  Subjective: No complaints.   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 Presented with generalized tonic-clonic seizures. Started on antiepileptics, Decadron. After consultation with neurology, neurosurgery and radiation oncology, patient underwent preoperative stereotactic radiosurgery, and subsequently craniotomy and resection on 10/06/14. Pathology favors abscess over neoplasm and Rocephin and Flagyl started on 1/7. Futher management per NS.  Active Problems:   Seizure disorder: Presented with seizures, seen by neurology. Initially started on Keppra, subsequently Vimpat was added due to ongoing seizure activity. No seizures since 12/29. 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    History of stage IIIB esophageal cancer with metastases:  Will need outpatient follow-up by [name]. Previously on systemic chemotherapy.    Acute respiratory failure with hypoxia: Suspect this was secondary to neurogenic (noncardiac) pulmonary edema from seizures. Resolved.    Anemia: Secondary to chronic disease and acute illness. Follow.    HTN: Controlled without the use of any antihypertensives currently. Continue prn IV labetalol.    History of DVT of right lower extremity: Diagnosed on 09/02/14, previously on xarelto prior to this admission. Started on heparin yesterday, currently off anticoagulation as underwent a craniotomy today. Resume anticoagulation at the discretion of neurosurgery.    History of atrial fibrillation: Maintaining sinus rhythm, resume Xarelto when okay with neurosurgery. Metoprolol held earlier this admission due to soft BP, resume when able    Severe protein calorie malnutrition: Continue supplements  Disposition: Remain inpatient  Antibiotics:  See below   Anti-infectives    Start     Dose/Rate Route Frequency Ordered Stop   10/07/14 0800  vancomycin (VANCOCIN) IVPB 1000 mg/200 mL premix     1,000 mg200 mL/hr over 60 Minutes Intravenous Every 12 hours 10/06/14 1908     10/06/14 2200  metroNIDAZOLE (FLAGYL) IVPB 500 mg     500 mg100 mL/hr over 60 Minutes Intravenous Every 6 hours 10/06/14 1853     10/06/14 2000  cefTRIAXone (ROCEPHIN) 2 g in dextrose 5 % 50 mL IVPB - Premix     2 g100 mL/hr over 30 Minutes Intravenous Every 12 hours 10/06/14 1853     10/06/14 1915  vancomycin (VANCOCIN) 1,500 mg in sodium chloride 0.9 % 500 mL IVPB     1,500 mg250 mL/hr over 120 Minutes Intravenous  Once 10/06/14 1908 10/06/14 2215   10/06/14 1400  ceFAZolin (ANCEF) IVPB 2 g/50 mL premix  Status:  Discontinued     2 g100 mL/hr over 30 Minutes Intravenous Every 8 hours 10/06/14 1111 10/06/14  1853   10/06/14 0851  bacitracin 50,000 Units in sodium chloride irrigation 0.9 % 500 mL irrigation  Status:  Discontinued       As  needed 10/06/14 8295 10/06/14 0948      DVT Prophylaxis: None  Code Status: Full code   Family Communication None at bedside  Procedures:  Craniotomy 1/7  CONSULTS:  Neurology/[name] Neurosurgery/Dr. Pool Oncology/[name] Holter onco/[name]  MEDICATIONS: Scheduled Meds: . azelastine  1 spray Each Nare BID  . cefTRIAXone (ROCEPHIN)  IV  2 g Intravenous Q12H  . dexamethasone  4 mg Oral 4 times per day  . feeding supplement (ENSURE COMPLETE)  237 mL Oral BID BM  . feeding supplement (PRO-STAT SUGAR FREE 64)  30 mL Oral BID WC  . lacosamide  100 mg Oral BID  . levETIRAcetam  1,500 mg Oral BID  . metronidazole  500 mg Intravenous Q6H  . pantoprazole  40 mg Oral Q1200  . polyethylene glycol  17 g Oral Daily  . polyethylene glycol  17 g Oral Daily  . senna-docusate  2 tablet Oral BID  . tiotropium  18 mcg Inhalation Daily  . vancomycin  1,000 mg Intravenous Q12H   Continuous Infusions: . 0.9 % NaCl with KCl 20 mEq / L 1,000 mL (10/07/14 2247)   PRN Meds:.acetaminophen **OR** acetaminophen, albuterol, fentaNYL, HYDROmorphone (DILAUDID) injection, labetalol, LORazepam, magic mouthwash, meperidine (DEMEROL) injection, ondansetron **OR** ondansetron (ZOFRAN) IV, oxyCODONE, promethazine, promethazine, sodium chloride    PHYSICAL EXAM: Vital signs in last 24 hours: Filed Vitals:   10/08/14 0800 10/08/14 0830 10/08/14 1000 10/08/14 1100  BP: 86/57 96/57 74/60  89/53  Pulse: 86 88 83 82  Temp: 98.3 F (36.8 C)     TempSrc: Oral     Resp: 18 21 15 14   Height:      Weight:      SpO2: 99% 100% 99% 99%    Weight change:  Filed Weights   09/25/14 1535 09/28/14 1226 10/06/14 1100  Weight: 86.1 kg (189 lb 13.1 oz) 85.73 kg (189 lb) 83.9 kg (184 lb 15.5 oz)   Body mass index is 28.96 kg/(m^2).   Gen Exam: Awake and alert-no acute distress Neck: Supple, No JVD.   Chest: B/L Clear.  CVS: S1 S2 Regular, no murmurs. Abdomen: soft, BS +, non tender, non  distended.  Extremities: no edema, lower extremities warm to touch. Skin: No Rash.    Intake/Output from previous day:  Intake/Output Summary (Last 24 hours) at 10/08/14 1144 Last data filed at 10/08/14 1000  Gross per 24 hour  Intake   2775 ml  Output   3790 ml  Net  -1015 ml     LAB RESULTS: CBC  Recent Labs Lab 10/02/14 0625 10/05/14 0450 10/06/14 0630 10/07/14 0525 10/08/14 0459  WBC 13.8* 13.5* 13.2* 18.8* 18.5*  HGB 8.6* 8.0* 8.1* 8.1* 7.6*  HCT 26.5* 23.6* 24.4* 23.7* 22.7*  PLT 241 222 242 220 222  MCV 95.3 95.5 94.2 92.9 93.4  MCH 30.9 32.4 31.3 31.8 31.3  MCHC 32.5 33.9 33.2 34.2 33.5  RDW 18.8* 18.8* 18.9* 18.9* 19.2*    Chemistries   Recent Labs Lab 10/04/14 0442 10/06/14 0630 10/07/14 0525 10/08/14 0846  NA 132* 131* 129* 130*  K 4.4 4.5 4.2 4.7  CL 99 102 100 104  CO2 26 26 20 20   GLUCOSE 134* 147* 114* 186*  BUN 37* 34* 27* 22  CREATININE 0.96 0.96 0.80 0.86  CALCIUM 8.3* 8.2* 8.2* 8.2*  CBG: No results for input(s): GLUCAP in the last 168 hours.  GFR Estimated Creatinine Clearance: 85.1 mL/min (by C-G formula based on Cr of 0.86).  Coagulation profile No results for input(s): INR, PROTIME in the last 168 hours.  Cardiac Enzymes No results for input(s): CKMB, TROPONINI, MYOGLOBIN in the last 168 hours.  Invalid input(s): CK  Invalid input(s): POCBNP No results for input(s): DDIMER in the last 72 hours. No results for input(s): HGBA1C in the last 72 hours. No results for input(s): CHOL, HDL, LDLCALC, TRIG, CHOLHDL, LDLDIRECT in the last 72 hours. No results for input(s): TSH, T4TOTAL, T3FREE, THYROIDAB in the last 72 hours.  Invalid input(s): FREET3 No results for input(s): VITAMINB12, FOLATE, FERRITIN, TIBC, IRON, RETICCTPCT in the last 72 hours. No results for input(s): LIPASE, AMYLASE in the last 72 hours.  Urine Studies No results for input(s): UHGB, CRYS in the last 72 hours.  Invalid input(s): UACOL, UAPR, USPG,  UPH, UTP, UGL, UKET, UBIL, UNIT, UROB, ULEU, UEPI, UWBC, URBC, UBAC, CAST, UCOM, BILUA  MICROBIOLOGY: Recent Results (from the past 240 hour(s))  MRSA PCR Screening     Status: None   Collection Time: 10/06/14  5:43 AM  Result Value Ref Range Status   MRSA by PCR NEGATIVE NEGATIVE Final    Comment:        The GeneXpert MRSA Assay (FDA approved for NASAL specimens only), is one component of a comprehensive MRSA colonization surveillance program. It is not intended to diagnose MRSA infection nor to guide or monitor treatment for MRSA infections.   Culture, routine-abscess     Status: None (Preliminary result)   Collection Time: 10/06/14  9:10 AM  Result Value Ref Range Status   Specimen Description ABSCESS  Final   Special Requests BRAIN TUMOR  Final   Gram Stain   Final    ABUNDANT WBC PRESENT,BOTH PMN AND MONONUCLEAR ABUNDANT GRAM POSITIVE COCCI IN PAIRS AND CHAINS Performed at [facility] Performed at [facility]    Culture   Final    Culture reincubated for better growth Performed at [facility]    Report Status PENDING  Incomplete  Gram stain     Status: None   Collection Time: 10/06/14  9:10 AM  Result Value Ref Range Status   Specimen Description ABSCESS  Final   Special Requests BRAIN TUMOR  Final   Gram Stain   Final    ABUNDANT WBC PRESENT,BOTH PMN AND MONONUCLEAR ABUNDANT GRAM POSITIVE COCCI IN PAIRS IN CHAINS    Report Status 10/06/2014 FINAL  Final    RADIOLOGY STUDIES/RESULTS: Violeta Gelinas, MD  [facility]  If 7PM-7AM, please contact night-coverage [email or web address] Password [email or web address] 10/08/2014, 11:44 AM   LOS: 13 days

## 2014-10-08 NOTE — Progress Notes (Signed)
Patient ID: [name] [name], male   DOB: 09-28-1946, 69 y.o.   MRN: [id number] Subjective:  The patient is alert and pleasant. He is in no apparent distress. He has no complaints.  Objective: Vital signs in last 24 hours: Temp:  [97.8 F (36.6 C)-98.6 F (37 C)] 98.3 F (36.8 C) (01/09 0400) Pulse Rate:  [73-105] 74 (01/09 0700) Resp:  [12-35] 21 (01/09 0700) BP: (72-117)/(42-81) 87/58 mmHg (01/09 0700) SpO2:  [94 %-100 %] 99 % (01/09 0700)  Intake/Output from previous day: 01/08 0701 - 01/09 0700 In: 2200 [I.V.:900; IV Piggyback:1300] Out: 4540 [Urine:4540] Intake/Output this shift:    Physical exam patient is alert and oriented 3. His pupils are equal. He is plegic in his right upper extremity. His right lower extremity is paretic.  Lab Results:  Recent Labs  10/07/14 0525 10/08/14 0459  WBC 18.8* 18.5*  HGB 8.1* 7.6*  HCT 23.7* 22.7*  PLT 220 222   BMET  Recent Labs  10/06/14 0630 10/07/14 0525  NA 131* 129*  K 4.5 4.2  CL 102 100  CO2 26 20  GLUCOSE 147* 114*  BUN 34* 27*  CREATININE 0.96 0.80  CALCIUM 8.2* 8.2*    Studies/Results: Mr [name] Contrast  10/07/2014   CLINICAL DATA:  Brain metastasis, status post stereotactic radiosurgery, and resection October 06, 2014.  EXAM: MRI HEAD WITHOUT AND WITH CONTRAST  TECHNIQUE: Multiplanar, multiecho pulse sequences of the brain and surrounding structures were obtained without and with intravenous contrast.  CONTRAST:  38mL MULTIHANCE GADOBENATE DIMEGLUMINE 529 MG/ML IV SOLN  COMPARISON:  MRI of the head September 28, 2014  FINDINGS: Mildly motion degraded examination.  Interval LEFT frontal craniotomy for resection of reported brain metastasis. Susceptibility artifact within the resection cavity, small amount of cytotoxic edema along the inferomedial resection margin similar surrounding T2 bright vasogenic edema. Intrinsic T1 shortening consistent with blood products in and about the resection cavity, feathery  enhancement along the margin the resection cavity, different in appearance of prior enhancing lesion.  No midline shift. No hydrocephalus. LEFT frontal extra-axial pneumocephalus. Based normal major intracranial vascular flow voids seen at the skull base.  Paranasal sinuses and mastoid air cells are well aerated. Ocular globes and orbital contents are unremarkable. Severe degenerative change of the cervical spine, and suspected cord compression at C3-4.  IMPRESSION: Status post resection of LEFT frontal lobe mass, small amount of cytotoxic edema along the inferomedial margin. Feathery enhancement along the resection cavity suggest treatment changes without convincing evidence of residual tumor. Similar surrounding vasogenic edema without midline shift.  Severe degenerative change of the included cervical spinal with suspected cord compression at C3-4. This would be better characterized on dedicated MRI of the cervical spine as clinically indicated.   Electronically Signed   By: Elon Alas   On: 10/07/2014 03:16    Assessment/Plan: Brain abscess: We'll continue supportive care and antibiotics.  LOS: 13 days     Alarik Radu D 10/08/2014, 8:28 AM

## 2014-10-09 DIAGNOSIS — Z9889 Other specified postprocedural states: Secondary | ICD-10-CM

## 2014-10-09 DIAGNOSIS — D649 Anemia, unspecified: Secondary | ICD-10-CM | POA: Diagnosis present

## 2014-10-09 DIAGNOSIS — B9689 Other specified bacterial agents as the cause of diseases classified elsewhere: Secondary | ICD-10-CM

## 2014-10-09 DIAGNOSIS — R739 Hyperglycemia, unspecified: Secondary | ICD-10-CM | POA: Diagnosis present

## 2014-10-09 DIAGNOSIS — G060 Intracranial abscess and granuloma: Principal | ICD-10-CM

## 2014-10-09 LAB — CULTURE, ROUTINE-ABSCESS

## 2014-10-09 LAB — CLOSTRIDIUM DIFFICILE BY PCR: Toxigenic C. Difficile by PCR: NEGATIVE

## 2014-10-09 MED ORDER — MAGNESIUM HYDROXIDE 400 MG/5ML PO SUSP
30.0000 mL | Freq: Every evening | ORAL | Status: DC | PRN
  Administered 2014-10-09: 30 mL via ORAL
  Filled 2014-10-09: qty 30

## 2014-10-09 NOTE — Progress Notes (Signed)
Patient ID: [name] [name], male   DOB: 1946/06/26, 69 y.o.   MRN: [id number] Subjective:  The patient is alert and pleasant. He complains of constipation.  Objective: Vital signs in last 24 hours: Temp:  [97.8 F (36.6 C)-98.4 F (36.9 C)] 97.8 F (36.6 C) (01/10 0400) Pulse Rate:  [69-108] 69 (01/10 0700) Resp:  [0-34] 23 (01/10 0700) BP: (74-146)/(50-89) 92/57 mmHg (01/10 0700) SpO2:  [98 %-100 %] 100 % (01/10 0700)  Intake/Output from previous day: 01/09 0701 - 01/10 0700 In: 3025 [P.O.:300; I.V.:1725; IV Piggyback:1000] Out: 2563 [Urine:3175] Intake/Output this shift:    Physical exam the patient is alert and oriented. His right upper extremities plegic. He moves his right lower extremity. His wound is healing well.  Lab Results:  Recent Labs  10/07/14 0525 10/08/14 0459  WBC 18.8* 18.5*  HGB 8.1* 7.6*  HCT 23.7* 22.7*  PLT 220 222   BMET  Recent Labs  10/07/14 0525 10/08/14 0846  NA 129* 130*  K 4.2 4.7  CL 100 104  CO2 20 20  GLUCOSE 114* 186*  BUN 27* 22  CREATININE 0.80 0.86  CALCIUM 8.2* 8.2*    Studies/Results: No results found.  Assessment/Plan: Status post craniotomy and radiosurgery: The patient is neurologically stable.  LOS: 14 days     Saxton Chain D 10/09/2014, 8:35 AM

## 2014-10-09 NOTE — Progress Notes (Signed)
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 He was found to have large metastatic lesion on his CT of his head. He was started on antiepileptics, Decadron. 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 Patient was seen in consultation by neurology, neurosurgery, and radiation oncology. Subsequently, patient underwent preoperative stereotactic radiosurgery by Dr Trenton Gammon, followed by craniotomy and resection on 10/06/14.  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 Presented with generalized tonic-clonic seizures. Started on antiepileptics, Decadron. After consultation with neurology, neurosurgery and radiation oncology, patient underwent preoperative stereotactic radiosurgery, and subsequently craniotomy and resection on 10/06/14. Pathology favors abscess over neoplasm and Vancomycin, Rocephin and Flagyl started on 1/7. Futher management per NS. - microbiology pending, will consult ID once  cultures final, for now continue current broad spectrum antibiotics - GPC in pairs and chains on gram stain and culture  Seizure disorder: Presented with seizures, seen by neurology. Initially started on Keppra, subsequently Vimpat was added due to ongoing seizure activity. No seizures since 12/29. 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  History of stage IIIB esophageal cancer with metastases: Will need outpatient follow-up by [name]. Previously on systemic chemotherapy.  Acute respiratory failure with hypoxia: Suspect this was secondary to neurogenic (noncardiac) pulmonary edema from seizures. Resolved.  Anemia: Secondary to chronic disease and acute illness. Follow.  HTN: currently hypotensive .  History of DVT of right lower extremity: Diagnosed on 09/02/14, previously on xarelto prior to this admission. Started on heparin prior to craniotomy, now off of anticoagulants.   History of atrial fibrillation: Maintaining sinus rhythm, resume anticoagulation after a total of 5 days after carniotomy- will need to discuss again with Dr Trenton Gammon prior to starting- Dr. Wynelle Cleveland has discussed with Dr Arnoldo Morale. - Metoprolol held earlier this admission due to soft BP, resume when able  Severe protein calorie malnutrition: Continue supplements  Diet: DIET DYS 3 Fluids: NS with KCl 75 cc/h DVT Prophylaxis: SCD  Code Status: Full Code Family Communication: none in the room  Disposition Plan: remain inpatient  Consultants:  Neurosurgery   Neurology  Oncology  Radiation Oncology   Procedures:  Craniotomy 1/7   Antibiotics Vancomycin 1/7 >> Metronidazole 1/7 >> Ceftriaxone 1/7 >>   Studies No new studies past 24 hours  Objective  Filed Vitals:   10/09/14 0600 10/09/14 0630  10/09/14 0700 10/09/14 0800  BP: 97/57 97/51 92/57    Pulse: 70  70 69   Temp:    98.2 F (36.8 C)  TempSrc:    Oral  Resp: 17 24 23    Height:      Weight:      SpO2: 100% 100% 100%     Intake/Output Summary (Last 24 hours) at 10/09/14 1026 Last data filed at 10/09/14 0700  Gross per 24 hour  Intake   2425 ml  Output   2625 ml  Net   -200 ml   Filed Weights   09/25/14 1535 09/28/14 1226 10/06/14 1100  Weight: 86.1 kg (189 lb 13.1 oz) 85.73 kg (189 lb) 83.9 kg (184 lb 15.5 oz)    Exam:  General:  NAD, eating breakfast   HEENT: no scleral icterus  Cardiovascular: RRR  Respiratory: CTA biL, no wheezing  Abdomen: soft, non tender  Skin: no rashes  Neuro: 0/5 RUE, 4/5 RLE, 5/5 on left   Data Reviewed: Basic Metabolic Panel:  Recent Labs Lab 10/04/14 0442 10/06/14 0630 10/07/14 0525 10/08/14 0846  NA 132* 131* 129* 130*  K 4.4 4.5 4.2 4.7  CL 99 102 100 104  CO2 26 26 20 20   GLUCOSE 134* 147* 114* 186*  BUN 37* 34* 27* 22  CREATININE 0.96 0.96 0.80 0.86  CALCIUM 8.3* 8.2* 8.2* 8.2*   Liver Function Tests:  Recent Labs Lab 10/06/14 0630  AST 20  ALT 26  ALKPHOS 63  BILITOT 0.3  PROT 4.4*  ALBUMIN 2.0*   CBC:  Recent Labs Lab 10/05/14 0450 10/06/14 0630 10/07/14 0525 10/08/14 0459  WBC 13.5* 13.2* 18.8* 18.5*  HGB 8.0* 8.1* 8.1* 7.6*  HCT 23.6* 24.4* 23.7* 22.7*  MCV 95.5 94.2 92.9 93.4  PLT 222 242 220 222   BNP (last 3 results)  Recent Labs  09/15/14 1320 09/17/14 0455  PROBNP 2151.0* 642.5*   Recent Results (from the past 240 hour(s))  MRSA PCR Screening     Status: None   Collection Time: 10/06/14  5:43 AM  Result Value Ref Range Status   MRSA by PCR NEGATIVE NEGATIVE Final    Comment:        The GeneXpert MRSA Assay (FDA approved for NASAL specimens only), is one component of a comprehensive MRSA colonization surveillance program. It is not intended to diagnose MRSA infection nor to guide or monitor treatment  for MRSA infections.   Culture, routine-abscess     Status: None (Preliminary result)   Collection Time: 10/06/14  9:10 AM  Result Value Ref Range Status   Specimen Description ABSCESS  Final   Special Requests BRAIN TUMOR  Final   Gram Stain   Final    ABUNDANT WBC PRESENT,BOTH PMN AND MONONUCLEAR ABUNDANT GRAM POSITIVE COCCI IN PAIRS AND CHAINS Performed at [facility] Performed at [facility]    Culture   Final    Culture reincubated for better growth Performed at [facility]    Report Status PENDING  Incomplete  Gram stain     Status: None   Collection Time: 10/06/14  9:10 AM  Result Value Ref Range Status   Specimen Description ABSCESS  Final   Special Requests BRAIN TUMOR  Final   Gram Stain   Final    ABUNDANT WBC PRESENT,BOTH PMN AND MONONUCLEAR ABUNDANT GRAM POSITIVE COCCI IN PAIRS IN CHAINS    Report Status 10/06/2014 FINAL  Final     Scheduled Meds: . azelastine  1 spray Each Nare BID  . cefTRIAXone (ROCEPHIN)  IV  2  g Intravenous Q12H  . dexamethasone  4 mg Oral 4 times per day  . feeding supplement (ENSURE COMPLETE)  237 mL Oral BID BM  . feeding supplement (PRO-STAT SUGAR FREE 64)  30 mL Oral BID WC  . lacosamide  100 mg Oral BID  . levETIRAcetam  1,500 mg Oral BID  . metronidazole  500 mg Intravenous Q6H  . pantoprazole  40 mg Oral Q1200  . polyethylene glycol  17 g Oral Daily  . polyethylene glycol  17 g Oral Daily  . senna-docusate  2 tablet Oral BID  . tiotropium  18 mcg Inhalation Daily  . vancomycin  1,000 mg Intravenous Q12H   Continuous Infusions: . 0.9 % NaCl with KCl 20 mEq / L 75 mL/hr at 10/08/14 [place], MD [facility] Pager [phone]. If 7 PM - 7 AM, please contact night-coverage at [email or web address], password [facility] 10/09/2014, 10:26 AM  LOS: 14 days

## 2014-10-09 NOTE — Consult Note (Signed)
[name] Infectious Disease    Date of Admission:  09/25/2014           Day 4 vancomycin        Day 4 ceftriaxone        Day 4 metronidazole       Reason for Consult: Brain abscess    Referring Physician: Dr. 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 azelastine  1 spray Each Nare BID  . cefTRIAXone (ROCEPHIN)  IV  2 g Intravenous Q12H  . dexamethasone  4 mg Oral 4 times per day  . feeding supplement (ENSURE COMPLETE)  237 mL Oral BID BM  . feeding supplement (PRO-STAT SUGAR FREE 64)  30 mL Oral BID WC  . lacosamide  100 mg Oral BID  . levETIRAcetam  1,500 mg Oral BID  . metronidazole  500 mg Intravenous Q6H  . pantoprazole  40 mg Oral Q1200  . polyethylene glycol  17 g Oral Daily  . polyethylene glycol  17 g Oral Daily  . senna-docusate  2 tablet Oral BID  . tiotropium  18 mcg Inhalation Daily  . vancomycin  1,000 mg Intravenous Q12H    Recommendations: 1. Continue current antibiotics pending abscess culture results   Assessment: Mr. Franzen has a focal brain abscess most likely due to occult, bacteremic seeding of the brain. He has not had any clinical signs or symptoms to suggest other sites of infection such as Port-A-Cath infection, bacteremia, endocarditis or pneumonia. He is on appropriate therapy pending abscess cultures.   HPI: [name] [name] is a 69 y.o. male whose been undergoing chemotherapy and radiation therapy for esophageal carcinoma. Several weeks ago he developed right arm and hand weakness and then presented to the  hospital on December 27 with new onset of right focal, tonic-clonic seizures. MRI showed an enhancing left posterior frontal mass and there was concern for metastases. He underwent left craniotomy on 10/06/2014 and pus under pressure was encountered. Operative Gram stain shows gram-positive cocci in pairs and chains. He has not had any recent fever, chills or sweats.   Review of Systems: Pertinent items are noted in HPI.  Past Medical History  Diagnosis Date  . GERD (gastroesophageal reflux disease)   . Hyperlipemia   . Barrett's esophagus 12/2013  . History of radiation therapy 02/23/14- 04/04/14    esophagus 5040 cGy 28 sessions  . 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 Hypertension     History  Substance Use Topics  . Smoking status: Former Smoker -- 2.00 packs/day for 35 years    Types: Cigarettes    Quit date: 09/30/1988  . Smokeless tobacco: Never Used     Comment: pt thinks he stopped smoking in 1990s  . Alcohol Use: Yes     Comment: occasional, history of alcohol abuse    Family History  Problem Relation Age of Onset  . Breast cancer Sister    No Known Allergies  OBJECTIVE: Blood pressure 102/56, pulse 77, temperature 97.3 F (36.3  C), temperature source Oral, resp. rate 18, height 5\' 7"  (1.702 m), weight 184 lb 15.5 oz (83.9 kg), SpO2 99 %. General: he is alert, comfortable and in no distress Skin: no acute rash Scalp: Left craniotomy incision stapled Lungs: clear Cor: distant but regular S1 and S2 with no murmur heard Chest: Right anterior chest Port-A-Cath site appears normal Abdomen: soft and nontender Extremities: He remains plegic in his right upper extremity  Lab Results Lab Results  Component Value Date   WBC 18.5* 10/08/2014   HGB 7.6* 10/08/2014   HCT 22.7* 10/08/2014   MCV 93.4 10/08/2014   PLT 222 10/08/2014    Lab Results  Component Value Date   CREATININE 0.86 10/08/2014   BUN 22 10/08/2014   NA 130* 10/08/2014   K 4.7 10/08/2014     CL 104 10/08/2014   CO2 20 10/08/2014    Lab Results  Component Value Date   ALT 26 10/06/2014   AST 20 10/06/2014   ALKPHOS 63 10/06/2014   BILITOT 0.3 10/06/2014     Microbiology: Recent Results (from the past 240 hour(s))  MRSA PCR Screening     Status: None   Collection Time: 10/06/14  5:43 AM  Result Value Ref Range Status   MRSA by PCR NEGATIVE NEGATIVE Final    Comment:        The GeneXpert MRSA Assay (FDA approved for NASAL specimens only), is one component of a comprehensive MRSA colonization surveillance program. It is not intended to diagnose MRSA infection nor to guide or monitor treatment for MRSA infections.   Culture, routine-abscess     Status: None   Collection Time: 10/06/14  9:10 AM  Result Value Ref Range Status   Specimen Description ABSCESS  Final   Special Requests BRAIN TUMOR  Final   Gram Stain   Final    ABUNDANT WBC PRESENT,BOTH PMN AND MONONUCLEAR ABUNDANT GRAM POSITIVE COCCI IN PAIRS AND CHAINS Performed at [facility] Performed at [facility]    Culture   Final    MODERATE EIKENELLA CORRODENS Note: Usually susceptible to penicillin and other beta lactam agents,quinolones,macrolides and tetracyclines. Performed at [facility]    Report Status 10/09/2014 FINAL  Final  Gram stain     Status: None   Collection Time: 10/06/14  9:10 AM  Result Value Ref Range Status   Specimen Description ABSCESS  Final   Special Requests BRAIN TUMOR  Final   Gram Stain   Final    ABUNDANT WBC PRESENT,BOTH PMN AND MONONUCLEAR ABUNDANT GRAM POSITIVE COCCI IN PAIRS IN CHAINS    Report Status 10/06/2014 FINAL  Final    Michel Bickers, [place] [facility] Infectious [facility] [phone] pager   [phone] cell 10/09/2014, 2:40 PM

## 2014-10-10 ENCOUNTER — Encounter (HOSPITAL_COMMUNITY): Payer: Self-pay | Admitting: Neurosurgery

## 2014-10-10 DIAGNOSIS — I4891 Unspecified atrial fibrillation: Secondary | ICD-10-CM

## 2014-10-10 DIAGNOSIS — G8191 Hemiplegia, unspecified affecting right dominant side: Secondary | ICD-10-CM

## 2014-10-10 DIAGNOSIS — Z9889 Other specified postprocedural states: Secondary | ICD-10-CM

## 2014-10-10 LAB — COMPREHENSIVE METABOLIC PANEL
ALBUMIN: 1.7 g/dL — AB (ref 3.5–5.2)
ALK PHOS: 56 U/L (ref 39–117)
ALT: 12 U/L (ref 0–53)
AST: 13 U/L (ref 0–37)
Anion gap: 5 (ref 5–15)
BUN: 28 mg/dL — AB (ref 6–23)
CO2: 21 mmol/L (ref 19–32)
Calcium: 8.1 mg/dL — ABNORMAL LOW (ref 8.4–10.5)
Chloride: 103 mEq/L (ref 96–112)
Creatinine, Ser: 0.82 mg/dL (ref 0.50–1.35)
GFR calc Af Amer: >90 mL/min (ref >90)
GFR calc non Af Amer: 89 mL/min — ABNORMAL LOW (ref >90)
Glucose, Bld: 145 mg/dL — ABNORMAL HIGH (ref 70–99)
Potassium: 4.7 mmol/L (ref 3.5–5.1)
Sodium: 129 mmol/L — ABNORMAL LOW (ref 135–145)
Total Bilirubin: 0.3 mg/dL (ref 0.3–1.2)
Total Protein: 4.1 g/dL — ABNORMAL LOW (ref 6.0–8.3)

## 2014-10-10 LAB — CBC
HEMATOCRIT: 23 % — AB (ref 39.0–52.0)
HEMOGLOBIN: 7.8 g/dL — AB (ref 13.0–17.0)
MCH: 31.8 pg (ref 26.0–34.0)
MCHC: 33.9 g/dL (ref 30.0–36.0)
MCV: 93.9 fL (ref 78.0–100.0)
PLATELETS: 225 10*3/uL (ref 150–400)
RBC: 2.45 MIL/uL — AB (ref 4.22–5.81)
RDW: 19 % — ABNORMAL HIGH (ref 11.5–15.5)
WBC: 14.4 10*3/uL — AB (ref 4.0–10.5)

## 2014-10-10 LAB — VANCOMYCIN, TROUGH: Vancomycin Tr: 18.8 ug/mL (ref 10.0–20.0)

## 2014-10-10 LAB — TYPE AND SCREEN
ABO/RH(D): O POS
Antibody Screen: NEGATIVE
UNIT DIVISION: 0
UNIT DIVISION: 0

## 2014-10-10 MED ORDER — METRONIDAZOLE 500 MG PO TABS
500.0000 mg | ORAL_TABLET | Freq: Four times a day (QID) | ORAL | Status: DC
  Administered 2014-10-10 – 2014-10-11 (×3): 500 mg via ORAL
  Filled 2014-10-10 (×3): qty 1

## 2014-10-10 NOTE — Progress Notes (Signed)
UR completed. Pt planned for SNF at d/c and CSW has already begun that process.   Sandi Mariscal, RN BSN [place] [facility] Trauma/Neuro ICU Case Manager [phone]

## 2014-10-10 NOTE — Progress Notes (Signed)
Overall stable. Right-sided hemiparesis improved with regard to his right lower extremity but right upper extremity remains plegic. No headache. No other problems. No further seizures. Wound healing well. Cultures consistent with anaerobic bacteria which should be well covered with penicillin. Final antibiotic choice per infectious disease. Continue efforts to rehabilitation and therapy.

## 2014-10-10 NOTE — Progress Notes (Signed)
NUTRITION FOLLOW UP  Intervention:   -Continue Ensure Complete po BID, each supplement provides 350 kcal and 13 grams of protein -Continue Pro-Stat BID -Encouraged intake of snacks and meals with use of Magic Mouthwash   Nutrition Dx:   Inadequate oral intake related to varied appetite as evidenced by pt's report and 5% weight loss in one month; ongoing.   Goal:   Pt to meet >/= 90% of their estimated nutrition needs; met.   Monitor:   Total protein/energy intake, labs, weights, supplement tolerance  Assessment:   Pt admitted from rehab with seizures. Pt with hx of esophageal cancer s/p XRT and ongoing chemo.  Pt s/p left craniotomy with resection of tumor vs brain abscess.  Pt reports appetite is pretty good. Pt consuming 100% of his meals and all supplements ordered each day.  Plan to return to rehab at d/c.   Height: Ht Readings from Last 1 Encounters:  10/06/14 5' 7" (1.702 m)    Weight Status:   Wt Readings from Last 1 Encounters:  10/06/14 184 lb 15.5 oz (83.9 kg)    Re-estimated needs:  Kcal: 2200-2400 Protein: 105-120 grams Fluid: 2.4 L/day  Skin: Stage 2 pressure ulcer on sacrum/coccyx    Diet Order: DIET DYS 3   Intake/Output Summary (Last 24 hours) at 10/10/14 1129 Last data filed at 10/10/14 0930  Gross per 24 hour  Intake   3390 ml  Output   3125 ml  Net    265 ml    Last BM: 1/08   Labs:   Recent Labs Lab 10/07/14 0525 10/08/14 0846 10/10/14 0227  NA 129* 130* 129*  K 4.2 4.7 4.7  CL 100 104 103  CO2 20 20 21  BUN 27* 22 28*  CREATININE 0.80 0.86 0.82  CALCIUM 8.2* 8.2* 8.1*  GLUCOSE 114* 186* 145*    CBG (last 3)  No results for input(s): GLUCAP in the last 72 hours.  Scheduled Meds: . azelastine  1 spray Each Nare BID  . cefTRIAXone (ROCEPHIN)  IV  2 g Intravenous Q12H  . dexamethasone  4 mg Oral 4 times per day  . feeding supplement (ENSURE COMPLETE)  237 mL Oral BID BM  . feeding supplement (PRO-STAT SUGAR FREE 64)  30  mL Oral BID WC  . lacosamide  100 mg Oral BID  . levETIRAcetam  1,500 mg Oral BID  . metronidazole  500 mg Intravenous Q6H  . pantoprazole  40 mg Oral Q1200  . polyethylene glycol  17 g Oral Daily  . senna-docusate  2 tablet Oral BID  . tiotropium  18 mcg Inhalation Daily    Continuous Infusions: . 0.9 % NaCl with KCl 20 mEq / L 75 mL/hr at 10/10/14 0900    Heather Pitts RD, LDN, CNSC [phone] Pager [phone] After Hours Pager      

## 2014-10-10 NOTE — Progress Notes (Signed)
[place] for Infectious Disease    Subjective: No new complaints   Antibiotics:  Anti-infectives    Start     Dose/Rate Route Frequency Ordered Stop   10/07/14 0800  vancomycin (VANCOCIN) IVPB 1000 mg/200 mL premix  Status:  Discontinued     1,000 mg200 mL/hr over 60 Minutes Intravenous Every 12 hours 10/06/14 1908 10/09/14 2333   10/06/14 2200  metroNIDAZOLE (FLAGYL) IVPB 500 mg     500 mg100 mL/hr over 60 Minutes Intravenous Every 6 hours 10/06/14 1853     10/06/14 2000  cefTRIAXone (ROCEPHIN) 2 g in dextrose 5 % 50 mL IVPB - Premix     2 g100 mL/hr over 30 Minutes Intravenous Every 12 hours 10/06/14 1853     10/06/14 1915  vancomycin (VANCOCIN) 1,500 mg in sodium chloride 0.9 % 500 mL IVPB     1,500 mg250 mL/hr over 120 Minutes Intravenous  Once 10/06/14 1908 10/06/14 2215   10/06/14 1400  ceFAZolin (ANCEF) IVPB 2 g/50 mL premix  Status:  Discontinued     2 g100 mL/hr over 30 Minutes Intravenous Every 8 hours 10/06/14 1111 10/06/14 1853   10/06/14 0851  bacitracin 50,000 Units in sodium chloride irrigation 0.9 % 500 mL irrigation  Status:  Discontinued       As needed 10/06/14 0852 10/06/14 0948      Medications: Scheduled Meds: . azelastine  1 spray Each Nare BID  . cefTRIAXone (ROCEPHIN)  IV  2 g Intravenous Q12H  . dexamethasone  4 mg Oral 4 times per day  . feeding supplement (ENSURE COMPLETE)  237 mL Oral BID BM  . feeding supplement (PRO-STAT SUGAR FREE 64)  30 mL Oral BID WC  . lacosamide  100 mg Oral BID  . levETIRAcetam  1,500 mg Oral BID  . metronidazole  500 mg Intravenous Q6H  . pantoprazole  40 mg Oral Q1200  . polyethylene glycol  17 g Oral Daily  . senna-docusate  2 tablet Oral BID  . tiotropium  18 mcg Inhalation Daily   Continuous Infusions: . 0.9 % NaCl with KCl 20 mEq / L 75 mL/hr at 10/10/14 1400   PRN Meds:.acetaminophen **OR** acetaminophen, albuterol, fentaNYL, HYDROmorphone (DILAUDID) injection, labetalol, LORazepam, magic mouthwash,  magnesium hydroxide, meperidine (DEMEROL) injection, ondansetron **OR** ondansetron (ZOFRAN) IV, oxyCODONE, promethazine, promethazine, sodium chloride    Objective: Weight change:   Intake/Output Summary (Last 24 hours) at 10/10/14 1727 Last data filed at 10/10/14 1400  Gross per 24 hour  Intake   3095 ml  Output   2500 ml  Net    595 ml   Blood pressure 97/56, pulse 82, temperature 98.3 F (36.8 C), temperature source Oral, resp. rate 24, height 5\' 7"  (1.702 m), weight 184 lb 15.5 oz (83.9 kg), SpO2 100 %. Temp:  [98.1 F (36.7 C)-98.7 F (37.1 C)] 98.3 F (36.8 C) (01/11 1423) Pulse Rate:  [73-108] 82 (01/11 1423) Resp:  [12-26] 24 (01/11 1423) BP: (80-118)/(49-69) 97/56 mmHg (01/11 1423) SpO2:  [96 %-100 %] 100 % (01/11 1423)  Physical Exam: General: Alert and awake, oriented c3. Marland Kitchen HEENT: EOMI, craniotomy site intact and clean, no oral thrush CVS regular rate, normal r,  no murmur rubs or gallops Chest: no wheezing, rales or rhonchi Abdomen: soft nontender, nondistended, normal bowel sounds, Extremities: no  clubbing or edema noted bilaterally Skin: Port-A-Cath site is clean Neuro:  Right sided hemiparesis  CBC:  CBC Latest Ref Rng 10/10/2014 10/08/2014 10/07/2014  WBC 4.0 -  10.5 K/uL 14.4(H) 18.5(H) 18.8(H)  Hemoglobin 13.0 - 17.0 g/dL 7.8(L) 7.6(L) 8.1(L)  Hematocrit 39.0 - 52.0 % 23.0(L) 22.7(L) 23.7(L)  Platelets 150 - 400 K/uL 225 222 220      BMET  Recent Labs  10/08/14 0846 10/10/14 0227  NA 130* 129*  K 4.7 4.7  CL 104 103  CO2 20 21  GLUCOSE 186* 145*  BUN 22 28*  CREATININE 0.86 0.82  CALCIUM 8.2* 8.1*     Liver Panel   Recent Labs  10/10/14 0227  PROT 4.1*  ALBUMIN 1.7*  AST 13  ALT 12  ALKPHOS 56  BILITOT 0.3       Sedimentation Rate No results for input(s): ESRSEDRATE in the last 72 hours. C-Reactive Protein No results for input(s): CRP in the last 72 hours.  Micro Results: Recent Results (from the past 720 hour(s))    Clostridium Difficile by PCR     Status: None   Collection Time: 09/15/14  8:59 AM  Result Value Ref Range Status   C difficile by pcr NEGATIVE NEGATIVE Final    Comment: Performed at [facility]  MRSA PCR Screening     Status: None   Collection Time: 09/25/14  4:49 PM  Result Value Ref Range Status   MRSA by PCR NEGATIVE NEGATIVE Final    Comment:        The GeneXpert MRSA Assay (FDA approved for NASAL specimens only), is one component of a comprehensive MRSA colonization surveillance program. It is not intended to diagnose MRSA infection nor to guide or monitor treatment for MRSA infections.   MRSA PCR Screening     Status: None   Collection Time: 10/06/14  5:43 AM  Result Value Ref Range Status   MRSA by PCR NEGATIVE NEGATIVE Final    Comment:        The GeneXpert MRSA Assay (FDA approved for NASAL specimens only), is one component of a comprehensive MRSA colonization surveillance program. It is not intended to diagnose MRSA infection nor to guide or monitor treatment for MRSA infections.   Culture, routine-abscess     Status: None   Collection Time: 10/06/14  9:10 AM  Result Value Ref Range Status   Specimen Description ABSCESS  Final   Special Requests BRAIN TUMOR  Final   Gram Stain   Final    ABUNDANT WBC PRESENT,BOTH PMN AND MONONUCLEAR ABUNDANT GRAM POSITIVE COCCI IN PAIRS AND CHAINS Performed at [facility] Performed at [facility]    Culture   Final    MODERATE EIKENELLA CORRODENS Note: Usually susceptible to penicillin and other beta lactam agents,quinolones,macrolides and tetracyclines. Performed at [facility]    Report Status 10/09/2014 FINAL  Final  Gram stain     Status: None   Collection Time: 10/06/14  9:10 AM  Result Value Ref Range Status   Specimen Description ABSCESS  Final   Special Requests BRAIN TUMOR  Final   Gram Stain   Final    ABUNDANT WBC PRESENT,BOTH PMN AND MONONUCLEAR ABUNDANT GRAM  POSITIVE COCCI IN PAIRS IN CHAINS    Report Status 10/06/2014 FINAL  Final  Clostridium Difficile by PCR     Status: None   Collection Time: 10/09/14  3:03 PM  Result Value Ref Range Status   C difficile by pcr NEGATIVE NEGATIVE Final    Studies/Results: No results found.    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 male  whose been undergoing chemotherapy and radiation therapy for esophageal carcinoma. Several weeks ago he developed right arm and hand weakness and then presented to the hospital on December 27 with new onset of right focal, tonic-clonic seizures. MRI showed an enhancing left posterior frontal mass and there was concern for metastases. 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 They typically occur in a polymicrobial setting and therefore I would like to make sure that other anaerobes are properly covered in addition to the Eikenella that has grown. I suspect the Eikenella seeded his bloodstream from his oropharynx. It is certainly possible that he could have endocarditis but this patient is clearly not a candidate for cardiothoracic surgery. It also conceivably could've seeded his port though I think this is less likely than would be the case with more acutely purulent organism such as staph aureus  or others that seem to love foreign bodies   --I will therefore narrow him to high dose Rocephin 2 g IV q 12 and continue metronidazole (which he can have orally for dosing simplicity)  --I will dc vancomycin  --I would treat him for 8 weeks with IV antibiotics and consider repeat Neuroimaging at that time. If he has endocarditis then we will have given him more than adequate medical therapy.  #2 Screening: check HIV and hep panel.  I will arrange [facility] with me in my clinic in next 4 weeks  Please make sure that facility giving him antibiotics faxes me weekly cbc c diff and bmp to [phone]    Thank you for this fascinating case.  I will sign off for now. Please call with further questions.   LOS: 15 days   Alcide Evener 10/10/2014, 5:27 PM

## 2014-10-10 NOTE — Progress Notes (Addendum)
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 He was found to have large metastatic lesion on his CT of his head. He was started on antiepileptics, Decadron. 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 Patient was seen in consultation by neurology, neurosurgery, and radiation oncology. Subsequently, patient underwent preoperative stereotactic radiosurgery by Dr Trenton Gammon, followed by craniotomy and resection on 10/06/14.  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 Presented with generalized tonic-clonic seizures. Started on antiepileptics, Decadron. After consultation with neurology, neurosurgery and radiation oncology, patient underwent preoperative stereotactic radiosurgery, and subsequently craniotomy and resection on 10/06/14. Pathology favors abscess over neoplasm and Vancomycin, Rocephin and Flagyl started on 1/7. Futher management per NS. - microbiology with Eikenella Corrodens, ID consulted yesterday,  appreciate input, probably can narrow antibiotics today   Seizure disorder: Presented with seizures, seen by neurology. Initially started on Keppra, subsequently Vimpat was added due to ongoing seizure activity. No seizures since 12/29. 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  History of stage IIIB esophageal cancer with metastases: Will need outpatient follow-up by [name]. Previously on systemic chemotherapy.  Acute respiratory failure with hypoxia: Suspect this was secondary to neurogenic (noncardiac) pulmonary edema from seizures. Resolved.  Anemia: Secondary to chronic disease and acute illness. Follow.  HTN: currently hypotensive .  History of DVT of right lower extremity: Diagnosed on 09/02/14, previously on xarelto prior to this admission. Started on heparin prior to craniotomy, now off of anticoagulants.  - I discussed with Dr. Annette Stable, anticoagulation needs to wait ~2 weeks, to be restarted around 1/21 - already anticoagulated for a month, will repeat DVT US.   History of atrial fibrillation: Maintaining sinus rhythm - Metoprolol held earlier this admission due to soft BP, resume when able - probably can resume anticoagulation 1/12  Severe protein calorie malnutrition: Continue supplements  Diet: DIET DYS 3 Fluids: NS with KCl 75 cc/h DVT Prophylaxis: SCD  Code Status: Full Code Family Communication: none in the room  Disposition Plan: remain inpatient  Consultants:  Neurosurgery   Neurology  Oncology  Radiation Oncology   Procedures:  Craniotomy 1/7   Antibiotics Vancomycin 1/7 >> Metronidazole 1/7 >> Ceftriaxone 1/7 >>   Studies No new studies past 24 hours  Objective  Filed Vitals:   10/10/14 0430 10/10/14 0500 10/10/14 0530 10/10/14 0600    BP: 92/52 80/49 94/69  98/59  Pulse: 74 82 84 91  Temp:      TempSrc:  Resp: 12 15 21 22   Height:      Weight:      SpO2: 98% 99% 99% 98%    Intake/Output Summary (Last 24 hours) at 10/10/14 0652 Last data filed at 10/10/14 0600  Gross per 24 hour  Intake   3260 ml  Output   3425 ml  Net   -165 ml   Filed Weights   09/25/14 1535 09/28/14 1226 10/06/14 1100  Weight: 86.1 kg (189 lb 13.1 oz) 85.73 kg (189 lb) 83.9 kg (184 lb 15.5 oz)    Exam:  General:  NAD, eating breakfast   HEENT: no scleral icterus  Cardiovascular: RRR  Respiratory: CTA biL, no wheezing  Abdomen: soft, non tender  Skin: no rashes  Neuro: 0/5 RUE, 4/5 RLE, 5/5 on left   Data Reviewed: Basic Metabolic Panel:  Recent Labs Lab 10/04/14 0442 10/06/14 0630 10/07/14 0525 10/08/14 0846 10/10/14 0227  NA 132* 131* 129* 130* 129*  K 4.4 4.5 4.2 4.7 4.7  CL 99 102 100 104 103  CO2 26 26 20 20 21   GLUCOSE 134* 147* 114* 186* 145*  BUN 37* 34* 27* 22 28*  CREATININE 0.96 0.96 0.80 0.86 0.82  CALCIUM 8.3* 8.2* 8.2* 8.2* 8.1*   Liver Function Tests:  Recent Labs Lab 10/06/14 0630 10/10/14 0227  AST 20 13  ALT 26 12  ALKPHOS 63 56  BILITOT 0.3 0.3  PROT 4.4* 4.1*  ALBUMIN 2.0* 1.7*   CBC:  Recent Labs Lab 10/05/14 0450 10/06/14 0630 10/07/14 0525 10/08/14 0459 10/10/14 0227  WBC 13.5* 13.2* 18.8* 18.5* 14.4*  HGB 8.0* 8.1* 8.1* 7.6* 7.8*  HCT 23.6* 24.4* 23.7* 22.7* 23.0*  MCV 95.5 94.2 92.9 93.4 93.9  PLT 222 242 220 222 225   BNP (last 3 results)  Recent Labs  09/15/14 1320 09/17/14 0455  PROBNP 2151.0* 642.5*   Recent Results (from the past 240 hour(s))  MRSA PCR Screening     Status: None   Collection Time: 10/06/14  5:43 AM  Result Value Ref Range Status   MRSA by PCR NEGATIVE NEGATIVE Final    Comment:        The GeneXpert MRSA Assay (FDA approved for NASAL specimens only), is one component of a comprehensive MRSA colonization surveillance program. It  is not intended to diagnose MRSA infection nor to guide or monitor treatment for MRSA infections.   Culture, routine-abscess     Status: None   Collection Time: 10/06/14  9:10 AM  Result Value Ref Range Status   Specimen Description ABSCESS  Final   Special Requests BRAIN TUMOR  Final   Gram Stain   Final    ABUNDANT WBC PRESENT,BOTH PMN AND MONONUCLEAR ABUNDANT GRAM POSITIVE COCCI IN PAIRS AND CHAINS Performed at [facility] Performed at [facility]    Culture   Final    MODERATE EIKENELLA CORRODENS Note: Usually susceptible to penicillin and other beta lactam agents,quinolones,macrolides and tetracyclines. Performed at [facility]    Report Status 10/09/2014 FINAL  Final  Gram stain     Status: None   Collection Time: 10/06/14  9:10 AM  Result Value Ref Range Status   Specimen Description ABSCESS  Final   Special Requests BRAIN TUMOR  Final   Gram Stain   Final    ABUNDANT WBC PRESENT,BOTH PMN AND MONONUCLEAR ABUNDANT GRAM POSITIVE COCCI IN PAIRS IN CHAINS    Report Status 10/06/2014 FINAL  Final  Clostridium Difficile by PCR  Status: None   Collection Time: 10/09/14  3:03 PM  Result Value Ref Range Status   C difficile by pcr NEGATIVE NEGATIVE Final     Scheduled Meds: . azelastine  1 spray Each Nare BID  . cefTRIAXone (ROCEPHIN)  IV  2 g Intravenous Q12H  . dexamethasone  4 mg Oral 4 times per day  . feeding supplement (ENSURE COMPLETE)  237 mL Oral BID BM  . feeding supplement (PRO-STAT SUGAR FREE 64)  30 mL Oral BID WC  . lacosamide  100 mg Oral BID  . levETIRAcetam  1,500 mg Oral BID  . metronidazole  500 mg Intravenous Q6H  . pantoprazole  40 mg Oral Q1200  . polyethylene glycol  17 g Oral Daily  . polyethylene glycol  17 g Oral Daily  . senna-docusate  2 tablet Oral BID  . tiotropium  18 mcg Inhalation Daily   Continuous Infusions: . 0.9 % NaCl with KCl 20 mEq / L 75 mL/hr at 10/08/14 Rich, MD [facility] Pager [phone]. If 7 PM - 7 AM, please contact night-coverage at [email or web address], password [facility] 10/10/2014, 6:52 AM  LOS: 15 days

## 2014-10-11 LAB — CBC
HCT: 23.4 % — ABNORMAL LOW (ref 39.0–52.0)
Hemoglobin: 7.8 g/dL — ABNORMAL LOW (ref 13.0–17.0)
MCH: 32.1 pg (ref 26.0–34.0)
MCHC: 33.3 g/dL (ref 30.0–36.0)
MCV: 96.3 fL (ref 78.0–100.0)
Platelets: 206 10*3/uL (ref 150–400)
RBC: 2.43 MIL/uL — AB (ref 4.22–5.81)
RDW: 18.7 % — ABNORMAL HIGH (ref 11.5–15.5)
WBC: 8.8 10*3/uL (ref 4.0–10.5)

## 2014-10-11 LAB — HEPATITIS PANEL, ACUTE
HCV AB: NEGATIVE
HEP A IGM: NONREACTIVE
HEP B C IGM: NONREACTIVE
Hepatitis B Surface Ag: NEGATIVE

## 2014-10-11 MED ORDER — ACETAMINOPHEN 325 MG PO TABS: 650.0000 mg | ORAL_TABLET | ORAL | Status: AC | PRN

## 2014-10-11 MED ORDER — OXYCODONE HCL 5 MG PO TABS: 5.0000 mg | ORAL_TABLET | ORAL | Status: DC | PRN

## 2014-10-11 MED ORDER — LEVETIRACETAM 750 MG PO TABS: 1500.0000 mg | ORAL_TABLET | Freq: Two times a day (BID) | ORAL | Status: AC

## 2014-10-11 MED ORDER — CEFTRIAXONE SODIUM IN DEXTROSE 40 MG/ML IV SOLN: 2.0000 g | Freq: Two times a day (BID) | INTRAVENOUS | Status: AC

## 2014-10-11 MED ORDER — LACOSAMIDE 100 MG PO TABS: 100.0000 mg | ORAL_TABLET | Freq: Two times a day (BID) | ORAL | Status: DC

## 2014-10-11 MED ORDER — HEPARIN SOD (PORK) LOCK FLUSH 100 UNIT/ML IV SOLN
500.0000 [IU] | INTRAVENOUS | Status: AC | PRN
  Administered 2014-10-11: 500 [IU]

## 2014-10-11 MED ORDER — METRONIDAZOLE 500 MG PO TABS: 500.0000 mg | ORAL_TABLET | Freq: Four times a day (QID) | ORAL | Status: AC

## 2014-10-11 MED ORDER — PANTOPRAZOLE SODIUM 40 MG PO TBEC: 40.0000 mg | DELAYED_RELEASE_TABLET | Freq: Every day | ORAL | Status: DC

## 2014-10-11 MED ORDER — DEXAMETHASONE 4 MG PO TABS
4.0000 mg | ORAL_TABLET | Freq: Four times a day (QID) | ORAL | Status: DC
Start: 2014-10-11 — End: 2014-12-06

## 2014-10-11 NOTE — Clinical Social Work Note (Signed)
CSW met pt at bedside. CSW introduce self and purpose of visit. CSW informed the pt that he will be discharge back to [facility] today. CSW and pt discussed ambulance transport. CSW asked the pt to notify family regarding her discharge. The pt reported yes. CSW contact the pt's granddaughter, [name] as intrusted by the pt. CSW contact [facility] at [phone] to schedule transport for the pt. CSW upload the pt's discharge summary. Bedside RN can call reported to [phone].    [place], MSW, [place]

## 2014-10-11 NOTE — Discharge Summary (Signed)
Physician Discharge Summary  [name] [name] IWP:[id number] DOB: 1946/05/22 DOA: 09/25/2014  PCP: [name], MD  Admit date: 09/25/2014 Discharge date: 10/11/2014  Time spent: 45 minutes  Recommendations for Outpatient Follow-up:  -Will be discharged to SNF today.  -To follow up with Dr. Tommy Medal (ID) in 4 weeks; his office will arrange follow up.  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   DVT of lower extremity (deep venous thrombosis)   BPH (benign prostatic hypertrophy)   Severe protein-calorie malnutrition   Acute on chronic respiratory failure with hypoxia   Essential hypertension   Normocytic anemia   Hyperglycemia   Discharge Condition: Stable and improved  Filed Weights   09/25/14 1535 09/28/14 1226 10/06/14 1100  Weight: 86.1 kg (189 lb 13.1 oz) 85.73 kg (189 lb) 83.9 kg (184 lb 15.5 oz)    History of present illness:  Patient is a 69 year old male with a history of 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 He was found to have large metastatic lesion on his CT of his head. He was started on antiepileptics, Decadron. 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 Patient was seen in consultation by neurology, neurosurgery, and radiation oncology. Subsequently, patient underwent preoperative stereotactic radiosurgery by Dr Trenton Gammon, followed by craniotomy and resection on 10/06/14.   Hospital Course:   Brain Abscess: Has stage IIIB esophageal cancer. Presented with generalized tonic-clonic seizures. Started on antiepileptics, Decadron. After consultation with neurology, neurosurgery and radiation oncology, patient  underwent preoperative stereotactic radiosurgery, and subsequently craniotomy and resection on 10/06/14. Pathology favors abscess over neoplasm and Vancomycin, Rocephin and Flagyl started on 1/7. Futher management per NS. - microbiology with Eikenella Corrodens, ID consulted yesterday, with recommendations to continue high dose rocephin and flagyl for 8 weeks with a planned stop date of 12/05/14.  Seizure disorder: Presented with seizures, seen by neurology. Initially started on Keppra, subsequently Vimpat was added due to ongoing seizure activity. No seizures since 12/29. 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 To SNF for rehab.  History of stage IIIB esophageal cancer with metastases: Will need outpatient follow-up by [name]. Previously on systemic chemotherapy.  Acute respiratory failure with hypoxia: Suspect this was secondary to neurogenic (noncardiac) pulmonary edema from seizures. Resolved.  Anemia: Secondary to chronic disease and acute illness. Follow.  HTN: All meds on hold since he has been somewhat hypotensive with SBP around 90s-100s.  History of DVT of right lower extremity: Diagnosed on 09/02/14, previously on xarelto prior to this admission. Started on heparin prior to craniotomy, now off of anticoagulants.  - Discussed with Dr. Annette Stable, anticoagulation needs to wait ~2 weeks, to be restarted around 1/21  History of atrial fibrillation: Maintaining sinus rhythm - Metoprolol held earlier this admission due to soft BP, resume when able - probably can resume anticoagulation 1/12  Severe protein calorie malnutrition: Continue supplements     Consultations:  ID  Neurology  Neurosurgery  Discharge Instructions  Discharge Instructions    Diet - low sodium heart healthy  Complete by:  As directed      Increase activity slowly    Complete by:  As directed             Medication List    STOP taking these medications        Rivaroxaban 15 MG Tabs tablet  Commonly known as:  XARELTO      TAKE these medications        acetaminophen 325 MG tablet  Commonly known as:  TYLENOL  Take 2 tablets (650 mg total) by mouth every 4 (four) hours as needed for mild pain (temp > 100.5).     cefTRIAXone 40 MG/ML IVPB  Commonly known as:  ROCEPHIN  Inject 50 mLs (2 g total) into the vein every 12 (twelve) hours.     dexamethasone 4 MG tablet  Commonly known as:  DECADRON  Take 1 tablet (4 mg total) by mouth every 6 (six) hours.     Lacosamide 100 MG Tabs  Take 1 tablet (100 mg total) by mouth 2 (two) times daily.     levETIRAcetam 750 MG tablet  Commonly known as:  KEPPRA  Take 2 tablets (1,500 mg total) by mouth 2 (two) times daily.     metroNIDAZOLE 500 MG tablet  Commonly known as:  FLAGYL  Take 1 tablet (500 mg total) by mouth every 6 (six) hours.     oxyCODONE 5 MG immediate release tablet  Commonly known as:  Oxy IR/ROXICODONE  Take 1 tablet (5 mg total) by mouth every 4 (four) hours as needed for moderate pain.     pantoprazole 40 MG tablet  Commonly known as:  PROTONIX  Take 1 tablet (40 mg total) by mouth daily at 12 noon.     tiotropium 18 MCG inhalation capsule  Commonly known as:  SPIRIVA  Place 1 capsule (18 mcg total) into inhaler and inhale daily.       No Known Allergies    The results of significant diagnostics from this hospitalization (including imaging, microbiology, ancillary and laboratory) are listed below for reference.    Significant Diagnostic Studies: Dg Chest 2 View  09/14/2014   CLINICAL DATA:  Recent falls due to weakness. History of lung cancer.  EXAM: CHEST  2 VIEW  COMPARISON:  Chest CT 06/1914  FINDINGS: Port-A-Cath tip in the lower SVC region. Chronic changes at the lung bases compatible with  bronchiectasis. New densities in the right mid lung are suggestive for fluid or densities in the right minor fissure. Heart size is normal. Negative for pneumothorax. Bony structures appear to be intact.  IMPRESSION: There is new fluid or densities in the right minor fissure region.  Chronic changes and bronchiectasis at the lung bases.   Electronically Signed   By: Markus Daft M.D.   On: 09/14/2014 20:18   Ct Head Wo Contrast  09/25/2014   CLINICAL DATA:  69 year old with code stroke. Right-sided weakness. History of esophageal cancer.  EXAM: CT HEAD WITHOUT CONTRAST  TECHNIQUE: Contiguous axial images were obtained from the base of the skull through the vertex without contrast.  COMPARISON:  None  FINDINGS: There is focal low density in the posterior left frontal lobe which is consistent with vasogenic edema and there appears to be an underlying lesion in this area. The lesion roughly measures 1.4 cm but poorly characterized on this non contrast examination. Findings are concerning for metastatic disease based on the history of esophagus cancer. There is no significant midline shift. There is no  evidence for acute hemorrhage, hydrocephalus or definite infarct.  No sinuses are clear.  No acute bone abnormality.  IMPRESSION: There is a focus of vasogenic edema in the left frontal lobe. Findings are concerning for a brain lesion and likely represent metastatic disease. There is no evidence for acute hemorrhage, hydrocephalus or midline shift. Recommend further characterization with MRI.  Critical Value/emergent results were called by telephone at the time of interpretation on 09/25/2014 at 12:48 pm to Dr. Leonel Ramsay, who verbally acknowledged these results.   Electronically Signed   By: Markus Daft M.D.   On: 09/25/2014 12:51   Mr Jeri Cos UG Contrast  10/07/2014   CLINICAL DATA:  Brain metastasis, status post stereotactic radiosurgery, and resection October 06, 2014.  EXAM: MRI HEAD WITHOUT AND WITH CONTRAST   TECHNIQUE: Multiplanar, multiecho pulse sequences of the brain and surrounding structures were obtained without and with intravenous contrast.  CONTRAST:  65mL MULTIHANCE GADOBENATE DIMEGLUMINE 529 MG/ML IV SOLN  COMPARISON:  MRI of the head September 28, 2014  FINDINGS: Mildly motion degraded examination.  Interval LEFT frontal craniotomy for resection of reported brain metastasis. Susceptibility artifact within the resection cavity, small amount of cytotoxic edema along the inferomedial resection margin similar surrounding T2 bright vasogenic edema. Intrinsic T1 shortening consistent with blood products in and about the resection cavity, feathery enhancement along the margin the resection cavity, different in appearance of prior enhancing lesion.  No midline shift. No hydrocephalus. LEFT frontal extra-axial pneumocephalus. Based normal major intracranial vascular flow voids seen at the skull base.  Paranasal sinuses and mastoid air cells are well aerated. Ocular globes and orbital contents are unremarkable. Severe degenerative change of the cervical spine, and suspected cord compression at C3-4.  IMPRESSION: Status post resection of LEFT frontal lobe mass, small amount of cytotoxic edema along the inferomedial margin. Feathery enhancement along the resection cavity suggest treatment changes without convincing evidence of residual tumor. Similar surrounding vasogenic edema without midline shift.  Severe degenerative change of the included cervical spinal with suspected cord compression at C3-4. This would be better characterized on dedicated MRI of the cervical spine as clinically indicated.   Electronically Signed   By: Elon Alas   On: 10/07/2014 03:16   Mr Brain W Wo Contrast  09/28/2014   CLINICAL DATA:  Frequent falls. Personal history of esophageal cancer treated with chemotherapy, ongoing. Right arm twitching. Possible seizure. Abnormal head CT. Abnormal MRI 3 days ago.  EXAM: MRI HEAD WITHOUT AND  WITH CONTRAST  TECHNIQUE: Multiplanar, multiecho pulse sequences of the brain and surrounding structures were obtained without and with intravenous contrast.  CONTRAST:  27mL MULTIHANCE GADOBENATE DIMEGLUMINE 529 MG/ML IV SOLN  COMPARISON:  MRI 09/25/2014.  CT 09/25/2014.  FINDINGS: There has been progression of a peripherally enhancing brain lesion in the left posterior frontal vertex. Outer measurements of the enhancing region have increased from 2 x 1.3 x 1.6 cm to 2.4 x 1.9 x 1.4 cm. Vasogenic edema has increased slightly. There is central restricted diffusion. These characteristics raise the possibility of brain abscess in addition to the possibility of rapidly progressive metastatic lesion. No second lesion is seen. The brain does not show any evidence of old or acute infarction. No pituitary mass. No inflammatory sinus disease. No hydrocephalus. No extra-axial collection. No skull or skullbase lesion.  IMPRESSION: Progression/enlargement of the left posterior frontal cortical lesion, increased in size and showing more vasogenic edema. Imaging characteristics raise concern for the possibility of brain abscess. The differential diagnosis would  be that of rapidly progressive solitary metastatic lesion.   Electronically Signed   By: Nelson Chimes M.D.   On: 09/28/2014 09:43   Mr Jeri Cos CB Contrast  09/26/2014   CLINICAL DATA:  New onset seizure. History of esophageal cancer. Initial encounter.  EXAM: MRI HEAD WITHOUT AND WITH CONTRAST  TECHNIQUE: Multiplanar, multiecho pulse sequences of the brain and surrounding structures were obtained without and with intravenous contrast.  CONTRAST:  32mL MULTIHANCE GADOBENATE DIMEGLUMINE 529 MG/ML IV SOLN  COMPARISON:  Prior CT from earlier the same day.  FINDINGS: There is an irregular enhancing mass that measures 1.3 x 1.6 x 2.0 cm within the high left posterior left frontal region (series 14, image 19). Heterogeneous T2 signal intensity with restricted diffusion  intensity within this lesion suggests central necrosis. There There is associated vasogenic edema within the left frontoparietal region. Finding concerning for possible metastatic disease. No other lesions identified within the brain. No other abnormal enhancement.  Mild T2/FLAIR hyperintensity within the periventricular and deep white matter both cerebral hemispheres noted, nonspecific, but likely related to mild chronic small vessel ischemic disease.  No abnormal foci of restricted diffusion to suggest acute intracranial infarct identified. Gray-white matter differentiation otherwise maintained. Normal intravascular flow voids present.  There is no midline shift. No hydrocephalus. No extra-axial fluid collection.  Craniocervical junction is within normal limits. Scattered degenerative changes present within the visualized upper cervical spine. There appears to be associated a least moderate to severe central canal stenosis at the C3-4 level, incompletely evaluated on this exam (series 2, image 13).  Pituitary gland within normal limits.  No acute abnormality seen about the orbits.  Paranasal sinuses and mastoid air cells are clear.  No acute abnormalities cm within the scalp soft tissues.  IMPRESSION: 1. 1.3 x 1.6 x 2.0 cm heterogeneously enhancing mass within the high posterior left frontal region, most concerning for metastatic disease given the history of esophageal carcinoma. No other intracranial lesions or abnormal enhancement identified. 2. No other acute intracranial process. 3. Degenerative changes within the upper cervical spine with associated severe canal stenosis at C3-4, incompletely evaluated on this exam. Further evaluation with MRI the cervical spine could be performed as clinically desired.   Electronically Signed   By: Jeannine Boga M.D.   On: 09/26/2014 00:44   Dg Chest Port 1 View  09/28/2014   CLINICAL DATA:  Hypoxic this am.  Weakness.  EXAM: PORTABLE CHEST - 1 VIEW  COMPARISON:   09/25/2014; 09/14/2014; chest CT -07/18/2014  FINDINGS: Grossly unchanged borderline enlarged cardiac silhouette and mediastinal contours given slightly reduced lung volumes. Stable position of support apparatus. No pneumothorax. The pulmonary vasculature is less distinct on present examination with cephalization of flow. Interval development of small bilateral effusions with associated worsening bibasilar opacities. No pneumothorax. Unchanged bones. Radiopaque material is seen within the gastric fundus.  IMPRESSION: Findings most suggestive of pulmonary edema with worsening bibasilar opacities, atelectasis versus infiltrate. A follow-up chest radiograph in 4 to 6 weeks after treatment is recommended to ensure resolution.   Electronically Signed   By: Sandi Mariscal M.D.   On: 09/28/2014 08:11   Dg Chest Port 1 View  09/25/2014   CLINICAL DATA:  Seizures.  EXAM: PORTABLE CHEST - 1 VIEW  COMPARISON:  09/14/2014  FINDINGS: Right IJ Port-A-Cath is unchanged with tip in the region of the cavoatrial junction. Lungs are adequately inflated with stable focal opacification over the right midlung which may represent fluid within the fissure. Hazy left  base opacification as cannot exclude small effusion with atelectasis versus infection. Mild stable cardiomegaly. Remainder the exam is unchanged.  IMPRESSION: Left base opacification which may represent a small effusion with atelectasis although cannot exclude infection. Stable focal opacification over the right mid lung likely fluid within the fissure.  Stable cardiomegaly.   Electronically Signed   By: Marin Olp M.D.   On: 09/25/2014 13:59   Dg Swallowing Func-speech Pathology  09/27/2014   [name], [place]     09/27/2014  2:35 PM Objective Swallowing Evaluation: Modified Barium Swallowing Study   Patient Details  Name: [name] [name] MRN: [id number] Date of Birth: Feb 03, 1946  Today's Date: 09/27/2014 Time: 1330-1400 SLP Time Calculation (min) (ACUTE  ONLY): 30 min  Past Medical History:  Past Medical History  Diagnosis Date  . GERD (gastroesophageal reflux disease)   . Hyperlipemia   . Barrett's esophagus 12/2013  . History of radiation therapy 02/23/14- 04/04/14    esophagus 5040 cGy 28 sessions  . 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 Hypertension    Past Surgical History:  Past Surgical History  Procedure Laterality Date  . Eus N/A 02/03/2014    Procedure: UPPER ENDOSCOPIC ULTRASOUND (EUS) LINEAR;  Surgeon:  Milus Banister, MD;  Location: [facility];  Service:  Endoscopy;  Laterality: N/A;   HPI:  [name] [name] is a 69 y.o. male with pmh of esophageal cancer  status post previous radiation treatments and ongoing  chemotherapy who presented to the emergency room and was just  discharged from the hospitalist service 6 days ago for diastolic  heart failure and frequent falls. He was brought in today by EMS  from his skilled nursing facility with some right arm and facial  twitching. There are concerns about seizure activity and while  being transported, pt had a witnessed generalized tonic-clonic  seizure lasting 3-4 minutes. A MRI scan of the head was  concerning for heterogeneously enhancing mass within the high  posterior left frontal regionfelt to be metastatic disease from  patient's esophageal cancer (lower third), and degenerative  changes within the upper cervical spine with associated severe  canal stenosis at C3-4     Assessment / Plan / Recommendation Clinical Impression  Dysphagia Diagnosis: Mild pharyngeal phase dysphagia Clinical impression: Pt demonstrates a mild pharyngeal phase  dysphagia characterized by sensory deficits resulting in delayed  swallow initiation and silent aspiration of mixed consistencies  (thin and barium tablet). No aspiration noted across trials of  thin, puree, or regular textures when consumed individually.  Pt  has a hx of GERD, esophageal cancer, and radiation treatments.  Suspect that a lifetime of reflux may be  playing a role in  reduced sensation leading to the above deficits. Recommend  dysphagia 2 (chopped) diet, due to pt's preference for softer  foods, and thin liquids. Medications to be given whole in puree.  SLP will follow briefly to ensure diet tolerance.    Treatment Recommendation  Therapy as outlined in treatment plan below    Diet Recommendation Dysphagia 2 (Fine chop);Thin liquid   Liquid Administration via: Cup;Straw Medication Administration: Whole meds with puree Supervision: Staff to assist with self feeding Compensations: Slow rate;Small sips/bites Postural Changes and/or Swallow Maneuvers: Seated upright 90  degrees;Upright 30-60 min after meal    Other  Recommendations Oral Care Recommendations: Oral care BID   Follow Up Recommendations  24 hour supervision/assistance    Frequency and Duration min 2x/week  2 weeks   Pertinent Vitals/Pain none  SLP Swallow Goals     General Date of Onset: 09/25/14 HPI: [name] [name] is a 69 y.o. male with pmh of esophageal  cancer status post previous radiation treatments and ongoing  chemotherapy who presented to the emergency room and was just  discharged from the hospitalist service 6 days ago for diastolic  heart failure and frequent falls. He was brought in today by EMS  from his skilled nursing facility with some right arm and facial  twitching. There are concerns about seizure activity and while  being transported, pt had a witnessed generalized tonic-clonic  seizure lasting 3-4 minutes. A MRI scan of the head was  concerning for heterogeneously enhancing mass within the high  posterior left frontal regionfelt to be metastatic disease from  patient's esophageal cancer (lower third), and degenerative  changes within the upper cervical spine with associated severe  canal stenosis at C3-4 Type of Study: Modified Barium Swallowing Study Reason for Referral: Objectively evaluate swallowing function Previous Swallow Assessment: BSE, 12/29 Diet Prior to this Study:  Dysphagia 2 (chopped);Thin liquids Temperature Spikes Noted: No Respiratory Status: Nasal cannula History of Recent Intubation: No Behavior/Cognition: Alert;Cooperative;Pleasant mood;Requires  cueing Oral Cavity - Dentition: Adequate natural dentition Oral Motor / Sensory Function: Within functional limits Self-Feeding Abilities: Able to feed self;Needs assist Patient Positioning: Upright in chair Baseline Vocal Quality: Hoarse Volitional Cough: Strong Volitional Swallow: Able to elicit Anatomy: Within functional limits Pharyngeal Secretions: Not observed secondary MBS    Reason for Referral Objectively evaluate swallowing function   Oral Phase Oral Preparation/Oral Phase Oral Phase: WFL   Pharyngeal Phase Pharyngeal Phase Pharyngeal Phase: Impaired Pharyngeal - Thin Pharyngeal - Thin Cup: Delayed swallow initiation;Premature  spillage to pyriform sinuses Pharyngeal - Thin Straw: Delayed swallow initiation;Premature  spillage to pyriform sinuses Pharyngeal - Solids Pharyngeal - Puree: Within functional limits Pharyngeal - Regular: Within functional limits Pharyngeal - Pill: Delayed swallow initiation;Premature spillage  to valleculae;Penetration/Aspiration before swallow Penetration/Aspiration details (pill): Material enters airway,  passes BELOW cords without attempt by patient to eject out  (silent aspiration)  Cervical Esophageal Phase    GO    Cervical Esophageal Phase Cervical Esophageal Phase: Impaired Cervical Esophageal Phase - Thin Thin Cup: Within functional limits Cervical Esophageal Phase - Solids Puree: Within functional limits Pill:  (appearance of stasis in the distal esophagus)         [name] 09/27/2014, 2:30 PM     Microbiology: Recent Results (from the past 240 hour(s))  MRSA PCR Screening     Status: None   Collection Time: 10/06/14  5:43 AM  Result Value Ref Range Status   MRSA by PCR NEGATIVE NEGATIVE Final    Comment:        The GeneXpert MRSA Assay (FDA approved for  NASAL specimens only), is one component of a comprehensive MRSA colonization surveillance program. It is not intended to diagnose MRSA infection nor to guide or monitor treatment for MRSA infections.   Culture, routine-abscess     Status: None   Collection Time: 10/06/14  9:10 AM  Result Value Ref Range Status   Specimen Description ABSCESS  Final   Special Requests BRAIN TUMOR  Final   Gram Stain   Final    ABUNDANT WBC PRESENT,BOTH PMN AND MONONUCLEAR ABUNDANT GRAM POSITIVE COCCI IN PAIRS AND CHAINS Performed at [facility] Performed at [facility]    Culture   Final    MODERATE EIKENELLA CORRODENS Note: Usually susceptible to penicillin and other beta lactam agents,quinolones,macrolides  and tetracyclines. Performed at [facility]    Report Status 10/09/2014 FINAL  Final  Gram stain     Status: None   Collection Time: 10/06/14  9:10 AM  Result Value Ref Range Status   Specimen Description ABSCESS  Final   Special Requests BRAIN TUMOR  Final   Gram Stain   Final    ABUNDANT WBC PRESENT,BOTH PMN AND MONONUCLEAR ABUNDANT GRAM POSITIVE COCCI IN PAIRS IN CHAINS    Report Status 10/06/2014 FINAL  Final  Clostridium Difficile by PCR     Status: None   Collection Time: 10/09/14  3:03 PM  Result Value Ref Range Status   C difficile by pcr NEGATIVE NEGATIVE Final     Labs: Basic Metabolic Panel:  Recent Labs Lab 10/06/14 0630 10/07/14 0525 10/08/14 0846 10/10/14 0227  NA 131* 129* 130* 129*  K 4.5 4.2 4.7 4.7  CL 102 100 104 103  CO2 26 20 20 21   GLUCOSE 147* 114* 186* 145*  BUN 34* 27* 22 28*  CREATININE 0.96 0.80 0.86 0.82  CALCIUM 8.2* 8.2* 8.2* 8.1*   Liver Function Tests:  Recent Labs Lab 10/06/14 0630 10/10/14 0227  AST 20 13  ALT 26 12  ALKPHOS 63 56  BILITOT 0.3 0.3  PROT 4.4* 4.1*  ALBUMIN 2.0* 1.7*   No results for input(s): LIPASE, AMYLASE in the last 168 hours. No results for input(s): AMMONIA in the last 168  hours. CBC:  Recent Labs Lab 10/06/14 0630 10/07/14 0525 10/08/14 0459 10/10/14 0227 10/11/14 0645  WBC 13.2* 18.8* 18.5* 14.4* 8.8  HGB 8.1* 8.1* 7.6* 7.8* 7.8*  HCT 24.4* 23.7* 22.7* 23.0* 23.4*  MCV 94.2 92.9 93.4 93.9 96.3  PLT 242 220 222 225 206   Cardiac Enzymes: No results for input(s): CKTOTAL, CKMB, CKMBINDEX, TROPONINI in the last 168 hours. BNP: BNP (last 3 results)  Recent Labs  09/15/14 1320 09/17/14 0455  PROBNP 2151.0* 642.5*   CBG: No results for input(s): GLUCAP in the last 168 hours.     SignedLelon Frohlich  [facility] Pager: [phone] 10/11/2014, 8:54 AM

## 2014-10-12 LAB — HIV ANTIBODY (ROUTINE TESTING W REFLEX)
HIV 1/O/2 Abs-Index Value: <1 (ref <1.00)
HIV-1/HIV-2 Ab: NONREACTIVE

## 2014-10-18 NOTE — Progress Notes (Signed)
  Radiation Oncology         [phone] ________________________________  Name: Cormac Wint MRN: [id number]  Date: 10/04/2014  DOB: July 05, 1946  End of Treatment Note    ICD-9-CM ICD-10-CM   1. 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 ExacTrac registration was performed for each couch angle. The 80% isodose line was prescribed. 6 MV X-rays were delivered in the flattening filter free beam mode  Narrative: The patient tolerated radiation treatment relatively well.   No complications were noted.  Plan: The patient has completed radiation treatment. The patient will proceed to surgical resection and then return to radiation oncology clinic for routine followup in one month. I advised them to call or return sooner if they have any questions or concerns related to their recovery or treatment. ________________________________  Sheral Apley. Tammi Klippel, M.D.

## 2014-11-03 ENCOUNTER — Other Ambulatory Visit: Payer: Self-pay | Admitting: Radiation Therapy

## 2014-11-03 DIAGNOSIS — C159 Malignant neoplasm of esophagus, unspecified: Secondary | ICD-10-CM

## 2014-11-07 ENCOUNTER — Encounter: Payer: Self-pay | Admitting: Radiation Oncology

## 2014-11-07 ENCOUNTER — Ambulatory Visit
Admission: RE | Admit: 2014-11-07 | Discharge: 2014-11-07 | Disposition: A | Discharge status: Home / Self Care | Payer: Medicare Other | Source: Ambulatory Visit | Attending: Radiation Oncology | Admitting: Radiation Oncology

## 2014-11-07 VITALS — BP 114/82 | HR 72 | Resp 16

## 2014-11-07 DIAGNOSIS — C7931 Secondary malignant neoplasm of brain: Secondary | ICD-10-CM

## 2014-11-07 NOTE — Addendum Note (Signed)
Encounter addended by: Heywood Footman, RN on: 11/07/2014 10:44 AM<BR>     Documentation filed: Notes Section

## 2014-11-07 NOTE — Addendum Note (Signed)
Encounter addended by: Arlyss Repress, RN on: 11/07/2014 10:30 AM<BR>     Documentation filed: Notes Section

## 2014-11-07 NOTE — Progress Notes (Signed)
Radiation Oncology         [phone]   Name: [name] [name]   Date: 11/07/2014   MRN: [id number]  DOB: November 10, 1945    Multidisciplinary Brain and Spine Oncology Clinic Follow-Up Visit Note  CC: [name] Palau, MD  [name] Pitter, MD    ICD-9-CM ICD-10-CM   1. 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  The recent films were presented in our multidisciplinary conference with neuroradiology just prior to the clinic.  Patient here with niece to review recent MRI. Patient alert and oriented to person, place, and time. No distress noted. Responds appropriately to questions. Denies headache, dizziness, nausea, diplopia or ringing in the ears. Patient unable to ambulate. Receiving PT while at [facility]. Denies seizure activity. Reports taking Keppra 750 mg two tablets bid.                               ALLERGIES:  has No Known Allergies.  Meds: Current Outpatient Prescriptions  Medication Sig Dispense Refill  . acetaminophen (TYLENOL) 325 MG tablet Take 2 tablets (650 mg total) by mouth every 4 (four) hours as needed for mild pain (temp > 100.5).    . cefTRIAXone (ROCEPHIN) 40 MG/ML IVPB Inject 50 mLs (2 g total) into the vein every 12 (twelve) hours. 50 mL   . dexamethasone (DECADRON) 4 MG tablet Take 1 tablet (4 mg total) by mouth every 6 (six) hours.    Marland Kitchen lacosamide 100 MG TABS Take 1 tablet (100 mg total) by mouth 2 (two) times daily. 60 tablet 1  . levETIRAcetam (KEPPRA) 750 MG tablet Take 2 tablets (1,500 mg total) by mouth 2 (two) times daily. 60 tablet 1  . lidocaine-prilocaine (EMLA) cream     . magnesium oxide (MAG-OX) 400 (241.3 MG) MG tablet Take 1 tablet by mouth 2 (two) times daily.  0  . megestrol (MEGACE) 40 MG/ML suspension     .  metroNIDAZOLE (FLAGYL) 500 MG tablet Take 1 tablet (500 mg total) by mouth every 6 (six) hours.    Marland Kitchen oxyCODONE (OXY IR/ROXICODONE) 5 MG immediate release tablet Take 1 tablet (5 mg total) by mouth every 4 (four) hours as needed for moderate pain. 30 tablet 0  . pantoprazole (PROTONIX) 40 MG tablet Take 1 tablet (40 mg total) by mouth daily at 12 noon. 30 tablet 1  . tiotropium (SPIRIVA) 18 MCG inhalation capsule Place 1 capsule (18 mcg total) into inhaler and inhale daily. 30 capsule 6  . traMADol (ULTRAM) 50 MG tablet Take 50 mg by mouth every 6 (six) hours as needed.  0  . XARELTO 15 MG TABS tablet     . XARELTO 20 MG TABS tablet     . XARELTO STARTER PACK 15 & 20 MG TBPK   0   No current facility-administered medications for this encounter.   Facility-Administered Medications Ordered in Other Encounters  Medication Dose Route Frequency Provider Last Rate Last Dose  . sodium chloride 0.9 % injection 10 mL  10 mL Intracatheter PRN Curt Bears, MD   10 mL at 05/28/14 1235    Physical Findings: The patient is in no acute distress. Patient is alert and  oriented.  blood pressure is 114/82 and his pulse is 72. His respiration is 16. .  No significant changes.  Lab Findings: Lab Results  Component Value Date   WBC 8.8 10/11/2014   HGB 7.8* 10/11/2014   HCT 23.4* 10/11/2014   MCV 96.3 10/11/2014   PLT 206 10/11/2014    Impression:  The patient is recovering from the effects of radiation.  Plan:  MRI in 2 months then follow-up.  Should contact [facility] [phone] and cell [phone] his facility   _____________________________________  Sheral Apley. Tammi Klippel, M.D.

## 2014-11-07 NOTE — Progress Notes (Signed)
Patient here with niece to review recent MRI. Patient alert and oriented to person, place, and time. No distress noted. Responds appropriately to questions. Denies headache, dizziness, nausea, diplopia or ringing in the ears. Patient unable to ambulate. Receiving PT while at [facility]. Denies seizure activity. Reports taking Keppra 750 mg two tablets bid.

## 2014-11-07 NOTE — Progress Notes (Signed)
I spoke with [employer] facility regarding transportation.Patient doesn't qualify for [facility].[place] services are utilized which cost the patient about $55.00 round trip.Insurance doesn't cover transportation.Cathie Beams has been informed.May call home at [phone] or cell [phone].She did state messages may be left on home phone.

## 2014-11-07 NOTE — Addendum Note (Signed)
Encounter addended by: Heywood Footman, RN on: 11/07/2014 10:33 AM<BR>     Documentation filed: Medications

## 2014-11-07 NOTE — Progress Notes (Signed)
Removed ten left scalp transverse staples per Dr. Marchelle Folks order. Incision well healed without redness, drainage or edema.

## 2014-11-09 ENCOUNTER — Telehealth: Payer: Self-pay | Admitting: Internal Medicine

## 2014-11-09 NOTE — Telephone Encounter (Signed)
pt called to sched appt....pt ok and aware of d.t

## 2014-11-09 NOTE — Telephone Encounter (Signed)
s.w. lillie she did not want to sched until after his current appt...she will call us back to r/s

## 2014-11-14 ENCOUNTER — Inpatient Hospital Stay: Payer: PRIVATE HEALTH INSURANCE | Admitting: Infectious Diseases

## 2014-11-18 ENCOUNTER — Other Ambulatory Visit (HOSPITAL_COMMUNITY): Payer: Self-pay | Admitting: Internal Medicine

## 2014-11-18 ENCOUNTER — Encounter: Payer: Self-pay | Admitting: Infectious Diseases

## 2014-11-18 ENCOUNTER — Other Ambulatory Visit: Payer: Self-pay | Admitting: Radiology

## 2014-11-18 ENCOUNTER — Ambulatory Visit (INDEPENDENT_AMBULATORY_CARE_PROVIDER_SITE_OTHER): Payer: Medicare Other | Admitting: Infectious Diseases

## 2014-11-18 VITALS — BP 122/93 | HR 145 | Temp 97.3°F

## 2014-11-18 DIAGNOSIS — I82402 Acute embolism and thrombosis of unspecified deep veins of left lower extremity: Secondary | ICD-10-CM | POA: Diagnosis not present

## 2014-11-18 DIAGNOSIS — I82401 Acute embolism and thrombosis of unspecified deep veins of right lower extremity: Secondary | ICD-10-CM

## 2014-11-18 DIAGNOSIS — R197 Diarrhea, unspecified: Secondary | ICD-10-CM

## 2014-11-18 DIAGNOSIS — B370 Candidal stomatitis: Secondary | ICD-10-CM | POA: Insufficient documentation

## 2014-11-18 DIAGNOSIS — G060 Intracranial abscess and granuloma: Secondary | ICD-10-CM | POA: Diagnosis not present

## 2014-11-18 DIAGNOSIS — C159 Malignant neoplasm of esophagus, unspecified: Secondary | ICD-10-CM | POA: Diagnosis not present

## 2014-11-18 NOTE — Progress Notes (Signed)
   Subjective:    Patient ID: [name] [name], male    DOB: 1945-11-27, 69 y.o.   MRN: [id number]  HPI 69 y.o. M with  Esophageal CA, receiving chemotherapy and radiation therapy. In December, he developed right arm and hand weakness and then presented to the hospital on December 27 with new onset of right focal, tonic-clonic seizures. MRI showed an enhancing left posterior frontal mass and there was concern for metastases. He underwent left craniotomy on 10/06/2014 and pus under pressure was encountered. Operative Cx grew E corrodens. He was seen by ID and his anbx were changed to ceftriaxone and flagyl, he was d/c home on 1-12 with plan for a 12 week course.  He has been feeling well. Has had no seizures, no headaches. No fever or chills. Has had diarrhea since on anbx, food "runs straight through him". Loose BM tid. No using depends. Staying at [facility] Occas confusion per family member . No further seizures.  No problems with PIC.  F/U Appt with Dr Earlie Server on 12-01-14. Family member states he is off ctx/xrt.  Has plan for repeat MRI on 01-06-15.  Family member states he was found ot have DVT in RLE today. He is on coumadin.  Review of med rec shows he is on levaquin for pneumonia for 7 days, start 2-17  Review of Systems  Constitutional: Negative for fever and chills.  Respiratory: Negative for shortness of breath.   Cardiovascular: Negative for chest pain.  Gastrointestinal: Positive for diarrhea.  Neurological: Negative for seizures and headaches.  see HPI.      Objective:   Physical Exam  Constitutional: No distress.  HENT:  Mouth/Throat: Oropharyngeal exudate present.  Eyes: EOM are normal. Pupils are equal, round, and reactive to light.  Neck: Neck supple.  Cardiovascular: Normal rate, regular rhythm and normal heart sounds.   Pulmonary/Chest: Effort normal and breath sounds normal.  Abdominal: Soft. Bowel sounds are normal. He exhibits no distension. There is  no tenderness.  Musculoskeletal: He exhibits edema.  RLE swelling.   Lymphadenopathy:    He has no cervical adenopathy.  Neurological:  Does not move RUE.           Assessment & Plan:

## 2014-11-18 NOTE — Assessment & Plan Note (Signed)
He is at week 5/12. Will continue his current anbx til the ned of therapy. Await his MRI, please forward a copy.  Not sure why he is on levauin, consider stopping this as a contributor to his thrush, diarrhea.

## 2014-11-18 NOTE — Assessment & Plan Note (Signed)
He is quite tachycardic. Would defer to his PCP to w/u for PE. He is being anticoagulated.

## 2014-11-18 NOTE — Assessment & Plan Note (Signed)
Will f/u with h/o. Appreciate their f/u.

## 2014-11-18 NOTE — Assessment & Plan Note (Addendum)
Please give him fluconazole 100mg  for 7 days.

## 2014-11-18 NOTE — Assessment & Plan Note (Signed)
Consider checking C diff.

## 2014-11-21 ENCOUNTER — Ambulatory Visit (HOSPITAL_COMMUNITY): Admission: RE | Admit: 2014-11-21 | Payer: PRIVATE HEALTH INSURANCE | Source: Ambulatory Visit

## 2014-11-21 ENCOUNTER — Other Ambulatory Visit: Payer: Self-pay | Admitting: Radiology

## 2014-11-21 ENCOUNTER — Other Ambulatory Visit: Payer: Medicare Other

## 2014-11-22 ENCOUNTER — Ambulatory Visit (HOSPITAL_COMMUNITY)
Admission: RE | Admit: 2014-11-22 | Discharge: 2014-11-22 | Disposition: A | Discharge status: Home / Self Care | Payer: Medicare Other | Source: Ambulatory Visit | Attending: Interventional Radiology | Admitting: Interventional Radiology

## 2014-11-22 ENCOUNTER — Encounter (HOSPITAL_COMMUNITY): Payer: Self-pay

## 2014-11-22 DIAGNOSIS — Z8501 Personal history of malignant neoplasm of esophagus: Secondary | ICD-10-CM | POA: Diagnosis not present

## 2014-11-22 DIAGNOSIS — I10 Essential (primary) hypertension: Secondary | ICD-10-CM | POA: Diagnosis not present

## 2014-11-22 DIAGNOSIS — K2270 Barrett's esophagus without dysplasia: Secondary | ICD-10-CM | POA: Diagnosis not present

## 2014-11-22 DIAGNOSIS — C7931 Secondary malignant neoplasm of brain: Secondary | ICD-10-CM | POA: Diagnosis not present

## 2014-11-22 DIAGNOSIS — I82401 Acute embolism and thrombosis of unspecified deep veins of right lower extremity: Secondary | ICD-10-CM | POA: Diagnosis not present

## 2014-11-22 DIAGNOSIS — Z86718 Personal history of other venous thrombosis and embolism: Secondary | ICD-10-CM | POA: Diagnosis not present

## 2014-11-22 DIAGNOSIS — Z87891 Personal history of nicotine dependence: Secondary | ICD-10-CM | POA: Insufficient documentation

## 2014-11-22 DIAGNOSIS — I82409 Acute embolism and thrombosis of unspecified deep veins of unspecified lower extremity: Secondary | ICD-10-CM | POA: Insufficient documentation

## 2014-11-22 DIAGNOSIS — K219 Gastro-esophageal reflux disease without esophagitis: Secondary | ICD-10-CM | POA: Diagnosis not present

## 2014-11-22 DIAGNOSIS — Z8673 Personal history of transient ischemic attack (TIA), and cerebral infarction without residual deficits: Secondary | ICD-10-CM | POA: Insufficient documentation

## 2014-11-22 MED ORDER — IOHEXOL 300 MG/ML  SOLN
100.0000 mL | Freq: Once | INTRAMUSCULAR | Status: AC | PRN
  Administered 2014-11-22: 50 mL via INTRAVENOUS

## 2014-11-22 MED ORDER — HEPARIN SOD (PORK) LOCK FLUSH 10 UNIT/ML IV SOLN
10.0000 [IU] | Freq: Once | INTRAVENOUS | Status: DC
  Filled 2014-11-22: qty 1

## 2014-11-22 MED ORDER — LIDOCAINE HCL 1 % IJ SOLN
INTRAMUSCULAR | Status: AC
  Filled 2014-11-22: qty 20

## 2014-11-22 MED ORDER — SODIUM CHLORIDE 0.9 % IV SOLN: INTRAVENOUS | Status: DC

## 2014-11-22 MED ORDER — MIDAZOLAM HCL 2 MG/2ML IJ SOLN
INTRAMUSCULAR | Status: AC | PRN
  Administered 2014-11-22: 0.5 mg via INTRAVENOUS

## 2014-11-22 MED ORDER — MIDAZOLAM HCL 2 MG/2ML IJ SOLN
INTRAMUSCULAR | Status: AC
  Filled 2014-11-22: qty 2

## 2014-11-22 NOTE — H&P (Signed)
Chief Complaint: R leg DVT  +swelling Cannot anticoagulate  Referring Physician(s): Amin,Saad  History of Present Illness: [name] [name] is a 69 y.o. male  Hx CVA Hx esophageal cancer Hx recent L frontal brain abscess--L craniotomy 10/06/14 Hx R DVT 08/2014 and placed on coumadin---developed thrombocytopenia and discontinued medicine Jan 2016. Has been off coumadin since then. Now with new development of Rt leg swelling and pain Doppler 11/15/14 reveals R DVT (RN sending study) Cannot use coumadin: thrombocytopenia; fall risk; recent brain surgery Request for Inferior vena cava filter placement   Past Medical History  Diagnosis Date  . GERD (gastroesophageal reflux disease)   . Hyperlipemia   . Barrett's esophagus 12/2013  . History of radiation therapy 02/23/14- 04/04/14    esophagus 5040 cGy 28 sessions  . 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 Hypertension   . 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 Eus N/A 02/03/2014    Procedure: UPPER ENDOSCOPIC ULTRASOUND (EUS) LINEAR;  Surgeon: Milus Banister, MD;  Location: [facility] ENDOSCOPY;  Service: Endoscopy;  Laterality: N/A;  . Craniotomy N/A 10/06/2014    Procedure: CRANIOTOMY TUMOR EXCISION WITH CURVE;  Surgeon: [name], MD;  Location: [facility] NEURO ORS;  Service: Neurosurgery;  Laterality: N/A;  CRANIOTOMY TUMOR EXCISION WITH CURVE    Allergies: Review of patient's allergies indicates no known allergies.  Medications: Prior to Admission medications   Medication Sig Start Date End Date Taking? Authorizing Provider  acetaminophen (TYLENOL) 325 MG tablet Take 2 tablets (650 mg total) by mouth every 4 (four) hours as needed for mild pain (temp > 100.5). 10/11/14  Yes Estela Leonie Green, MD  albuterol (PROVENTIL HFA;VENTOLIN HFA) 108 (90 BASE) MCG/ACT inhaler Inhale into the lungs every 6 (six) hours as needed for wheezing or shortness of breath.   Yes Historical  Provider, MD  Amino Acids-Protein Hydrolys (FEEDING SUPPLEMENT, PRO-STAT SUGAR FREE 64,) LIQD Take 30 mLs by mouth daily.   Yes Historical Provider, MD  dexamethasone (DECADRON) 4 MG tablet Take 1 tablet (4 mg total) by mouth every 6 (six) hours. 10/11/14  Yes Estela Leonie Green, MD  ENSURE PLUS (ENSURE PLUS) LIQD Take 237 mLs by mouth.   Yes Historical Provider, MD  esomeprazole (NEXIUM) 40 MG capsule Take 40 mg by mouth daily at 12 noon.   Yes Historical Provider, MD  finasteride (PROSCAR) 5 MG tablet Take 5 mg by mouth daily.   Yes Historical Provider, MD  lacosamide 100 MG TABS Take 1 tablet (100 mg total) by mouth 2 (two) times daily. 10/11/14  Yes Estela Leonie Green, MD  levETIRAcetam (KEPPRA) 750 MG tablet Take 2 tablets (1,500 mg total) by mouth 2 (two) times daily. 10/11/14  Yes Erline Hau, MD  levofloxacin (LEVAQUIN) 750 MG tablet Take 750 mg by mouth daily. 7 day regimen 11/17/14-11/23/14   Yes Historical Provider, MD  metroNIDAZOLE (FLAGYL) 500 MG tablet Take 1 tablet (500 mg total) by mouth every 6 (six) hours. 10/11/14 12/05/14 Yes Estela Leonie Green, MD  mirtazapine (REMERON) 7.5 MG tablet Take 7.5 mg by mouth at bedtime.   Yes Historical Provider, MD  Multiple Vitamin (MULTIVITAMIN) tablet Take 1 tablet by mouth daily.   Yes Historical Provider, MD  omeprazole (PRILOSEC) 20 MG capsule Take 20 mg by mouth daily.   Yes Historical Provider, MD  oxyCODONE (OXY IR/ROXICODONE) 5 MG immediate release tablet Take 1  tablet (5 mg total) by mouth every 4 (four) hours as needed for moderate pain. 10/11/14  Yes Estela Leonie Green, MD  pantoprazole (PROTONIX) 40 MG tablet Take 1 tablet (40 mg total) by mouth daily at 12 noon. 10/11/14  Yes Estela Leonie Green, MD  sodium chloride 0.9 % injection Inject 10 mLs into the vein as needed (every 12 hours and after medication administration).   Yes Historical Provider, MD  tiotropium (SPIRIVA) 18 MCG inhalation  capsule Place 1 capsule (18 mcg total) into inhaler and inhale daily. 08/18/14  Yes Brand Males, MD  cefTRIAXone (ROCEPHIN) 40 MG/ML IVPB Inject 50 mLs (2 g total) into the vein every 12 (twelve) hours. Patient not taking: Reported on 11/18/2014 10/11/14 12/05/14  Erline Hau, MD  warfarin (COUMADIN) 2 MG tablet Take 2 mg by mouth daily. For positive DVT right leg    Historical Provider, MD     Family History  Problem Relation Age of Onset  . Breast cancer Sister     History   Social History  . Marital Status: Single    Spouse Name: N/A  . Number of Children: N/A  . Years of Education: N/A   Occupational History  . retired    Social History Main Topics  . Smoking status: Former Smoker -- 2.00 packs/day for 35 years    Types: Cigarettes    Quit date: 09/30/1988  . Smokeless tobacco: Never Used     Comment: pt thinks he stopped smoking in 1990s  . Alcohol Use: Yes     Comment: occasional, history of alcohol abuse  . Drug Use: No  . Sexual Activity: Not on file   Other Topics Concern  . None   Social History Narrative     Review of Systems: A 12 point ROS discussed and pertinent positives are indicated in the HPI above.  All other systems are negative.  Review of Systems  Constitutional: Positive for activity change, appetite change and fatigue. Negative for fever.  Respiratory: Negative for shortness of breath.   Cardiovascular: Positive for leg swelling.  Genitourinary: Negative for difficulty urinating.  Neurological: Positive for weakness.    Vital Signs: BP 129/84 mmHg  Pulse 69  Temp(Src) 98 F (36.7 C) (Oral)  Resp 18  Ht 5\' 7"  (1.702 m)  Wt 83.462 kg (184 lb)  BMI 28.81 kg/m2  SpO2 94%  Physical Exam  Cardiovascular: Normal rate and regular rhythm.   Pulmonary/Chest: Effort normal and breath sounds normal. He has no wheezes.  Abdominal: Soft. Bowel sounds are normal.  Musculoskeletal:  Can follow commands Moves Left arm/hand; can  move both legs slightly Rt leg swelling  Skin: Skin is warm and dry.  Psychiatric: He has a normal mood and affect.  Consented grand daughter- POA via phone  Nursing note and vitals reviewed.   Mallampati Score:  MD Evaluation Airway: WNL Heart: WNL Abdomen: WNL ASA  Classification: 3 Mallampati/Airway Score: Two  Imaging: No results found.  Labs:  CBC:  Recent Labs  10/07/14 0525 10/08/14 0459 10/10/14 0227 10/11/14 0645  WBC 18.8* 18.5* 14.4* 8.8  HGB 8.1* 7.6* 7.8* 7.8*  HCT 23.7* 22.7* 23.0* 23.4*  PLT 220 222 225 206    COAGS:  Recent Labs  01/26/14 1148 05/20/14 1300 09/25/14 1221 09/26/14 0455  INR 1.1* 0.99 2.38*  --   APTT  --   --  33 156*    BMP:  Recent Labs  10/06/14 0630 10/07/14 0525 10/08/14 1017  10/10/14 0227  NA 131* 129* 130* 129*  K 4.5 4.2 4.7 4.7  CL 102 100 104 103  CO2 26 20 20 21   GLUCOSE 147* 114* 186* 145*  BUN 34* 27* 22 28*  CALCIUM 8.2* 8.2* 8.2* 8.1*  CREATININE 0.96 0.80 0.86 0.82  GFRNONAA 83* 90* 87* 89*  GFRAA >90 >90 >90 >90    LIVER FUNCTION TESTS:  Recent Labs  09/25/14 1221 09/27/14 0343 10/06/14 0630 10/10/14 0227  BILITOT 0.5 0.5 0.3 0.3  AST 22 13 20 13   ALT 15 11 26 12   ALKPHOS 80 64 63 56  PROT 5.2* 4.4* 4.4* 4.1*  ALBUMIN 2.5* 2.0* 2.0* 1.7*    TUMOR MARKERS:  Recent Labs  02/01/14 1302  CEA 12.4*    Assessment and Plan:  Rt leg swelling New Rt lower extremity DVT per doppler 11/15/14 Cannot anticoagulate: L frontal craniotomy 09/2014; fall risk; thrombocytopenia Scheduled for IVC filter placement Pts Gdtr/POA aware of procedure benefits and risks and agreeable to proceed Consent signed andin chart  Thank you for this interesting consult.  I greatly enjoyed meeting [name] [name] and look forward to participating in their care.  Signed: Stefan Karen A 11/22/2014, 12:31 PM   I spent a total of  20 Minutes   in face to face in clinical consultation, greater than 50% of  which was counseling/coordinating care for Inferior vena cava filter placement

## 2014-11-22 NOTE — Discharge Instructions (Signed)
Inferior Vena Cava Filter Insertion, Care After °Refer to this sheet in the next few weeks. These instructions provide you with information on caring for yourself after your procedure. Your health care provider may also give you more specific instructions. Your treatment has been planned according to current medical practices, but problems sometimes occur. Call your health care provider if you have any problems or questions after your procedure. °WHAT TO EXPECT AFTER THE PROCEDURE °After your procedure, it is typical to have the following: °· Mild pain in the area where the filter was inserted. °· Mild bruising in the area where the filter was inserted. °HOME CARE INSTRUCTIONS °· You will be given medicine to control pain. Only take over-the-counter or prescription medicines for pain, fever, or discomfort as directed by your health care provider. °· A bandage (dressing) has been placed over the insertion site. Follow your health care provider's instructions on how to care for it. °· Keep the insertion site clean and dry. °· Do not soak in a bath tub or pool until the filter insertion site has healed. °· Do not drive if you are taking narcotic pain medicines. Follow your health care provider's instructions about driving.  °· Do not return to work or school until your health care provider says it is okay.   °· Keep all follow-up appointments.   °SEEK IMMEDIATE MEDICAL CARE IF: °· You develop swelling and discoloration or pain in the legs. °· Your legs become pale and cold or blue. °· You develop shortness of breath, feel faint, or pass out. °· You develop chest pain, a cough, or difficulty breathing. °· You cough up blood. °· You develop a rash or feel you are having problems that may be a side effect of medicines. °· You develop weakness, difficulty moving your arms or legs, or balance problems. °· You develop problems with speech or vision. °Document Released: 07/07/2013 Document Reviewed: 07/07/2013 °ExitCare®  Patient Information ©2015 [employer]. This information is not intended to replace advice given to you by your health care provider. Make sure you discuss any questions you have with your health care provider. ° °

## 2014-12-01 ENCOUNTER — Ambulatory Visit: Payer: PRIVATE HEALTH INSURANCE | Admitting: Internal Medicine

## 2014-12-01 ENCOUNTER — Other Ambulatory Visit: Payer: PRIVATE HEALTH INSURANCE

## 2014-12-01 ENCOUNTER — Telehealth: Payer: Self-pay | Admitting: Medical Oncology

## 2014-12-01 NOTE — Telephone Encounter (Signed)
Rceived message from on call to cancel appt today. I called pt and left message to call me back.

## 2014-12-02 ENCOUNTER — Emergency Department (HOSPITAL_COMMUNITY): Payer: Medicare Other

## 2014-12-02 ENCOUNTER — Other Ambulatory Visit (HOSPITAL_COMMUNITY): Payer: Self-pay

## 2014-12-02 ENCOUNTER — Telehealth: Payer: Self-pay | Admitting: *Deleted

## 2014-12-02 ENCOUNTER — Other Ambulatory Visit: Payer: Self-pay | Admitting: Physician Assistant

## 2014-12-02 ENCOUNTER — Encounter (HOSPITAL_COMMUNITY): Payer: Self-pay | Admitting: Emergency Medicine

## 2014-12-02 ENCOUNTER — Inpatient Hospital Stay (HOSPITAL_COMMUNITY)
Admission: EM | Admit: 2014-12-02 | Discharge: 2014-12-06 | DRG: 871 | Disposition: A | Discharge status: Hospice | Payer: Medicare Other | Attending: Internal Medicine | Admitting: Internal Medicine

## 2014-12-02 DIAGNOSIS — K219 Gastro-esophageal reflux disease without esophagitis: Secondary | ICD-10-CM | POA: Diagnosis present

## 2014-12-02 DIAGNOSIS — R0600 Dyspnea, unspecified: Secondary | ICD-10-CM | POA: Diagnosis not present

## 2014-12-02 DIAGNOSIS — E861 Hypovolemia: Secondary | ICD-10-CM | POA: Diagnosis present

## 2014-12-02 DIAGNOSIS — D649 Anemia, unspecified: Secondary | ICD-10-CM | POA: Diagnosis present

## 2014-12-02 DIAGNOSIS — D6959 Other secondary thrombocytopenia: Secondary | ICD-10-CM | POA: Diagnosis present

## 2014-12-02 DIAGNOSIS — E785 Hyperlipidemia, unspecified: Secondary | ICD-10-CM | POA: Diagnosis present

## 2014-12-02 DIAGNOSIS — J9691 Respiratory failure, unspecified with hypoxia: Secondary | ICD-10-CM | POA: Diagnosis present

## 2014-12-02 DIAGNOSIS — R1310 Dysphagia, unspecified: Secondary | ICD-10-CM | POA: Diagnosis present

## 2014-12-02 DIAGNOSIS — G9340 Encephalopathy, unspecified: Secondary | ICD-10-CM | POA: Diagnosis present

## 2014-12-02 DIAGNOSIS — C7931 Secondary malignant neoplasm of brain: Secondary | ICD-10-CM | POA: Diagnosis present

## 2014-12-02 DIAGNOSIS — E876 Hypokalemia: Secondary | ICD-10-CM | POA: Diagnosis present

## 2014-12-02 DIAGNOSIS — C159 Malignant neoplasm of esophagus, unspecified: Secondary | ICD-10-CM | POA: Diagnosis present

## 2014-12-02 DIAGNOSIS — A419 Sepsis, unspecified organism: Principal | ICD-10-CM | POA: Diagnosis present

## 2014-12-02 DIAGNOSIS — Y95 Nosocomial condition: Secondary | ICD-10-CM | POA: Diagnosis present

## 2014-12-02 DIAGNOSIS — I447 Left bundle-branch block, unspecified: Secondary | ICD-10-CM | POA: Diagnosis present

## 2014-12-02 DIAGNOSIS — G9341 Metabolic encephalopathy: Secondary | ICD-10-CM | POA: Diagnosis present

## 2014-12-02 DIAGNOSIS — R739 Hyperglycemia, unspecified: Secondary | ICD-10-CM | POA: Diagnosis present

## 2014-12-02 DIAGNOSIS — I959 Hypotension, unspecified: Secondary | ICD-10-CM | POA: Diagnosis present

## 2014-12-02 DIAGNOSIS — I4891 Unspecified atrial fibrillation: Secondary | ICD-10-CM | POA: Diagnosis present

## 2014-12-02 DIAGNOSIS — Z66 Do not resuscitate: Secondary | ICD-10-CM | POA: Diagnosis present

## 2014-12-02 DIAGNOSIS — Z923 Personal history of irradiation: Secondary | ICD-10-CM | POA: Diagnosis not present

## 2014-12-02 DIAGNOSIS — J189 Pneumonia, unspecified organism: Secondary | ICD-10-CM | POA: Diagnosis present

## 2014-12-02 DIAGNOSIS — Z8501 Personal history of malignant neoplasm of esophagus: Secondary | ICD-10-CM | POA: Diagnosis not present

## 2014-12-02 DIAGNOSIS — I10 Essential (primary) hypertension: Secondary | ICD-10-CM | POA: Diagnosis present

## 2014-12-02 DIAGNOSIS — Z803 Family history of malignant neoplasm of breast: Secondary | ICD-10-CM | POA: Diagnosis not present

## 2014-12-02 DIAGNOSIS — Z6830 Body mass index (BMI) 30.0-30.9, adult: Secondary | ICD-10-CM | POA: Diagnosis not present

## 2014-12-02 DIAGNOSIS — I739 Peripheral vascular disease, unspecified: Secondary | ICD-10-CM | POA: Diagnosis present

## 2014-12-02 DIAGNOSIS — R6521 Severe sepsis with septic shock: Secondary | ICD-10-CM | POA: Diagnosis present

## 2014-12-02 DIAGNOSIS — G9389 Other specified disorders of brain: Secondary | ICD-10-CM | POA: Diagnosis present

## 2014-12-02 DIAGNOSIS — N12 Tubulo-interstitial nephritis, not specified as acute or chronic: Secondary | ICD-10-CM

## 2014-12-02 DIAGNOSIS — K117 Disturbances of salivary secretion: Secondary | ICD-10-CM | POA: Diagnosis present

## 2014-12-02 DIAGNOSIS — R410 Disorientation, unspecified: Secondary | ICD-10-CM | POA: Diagnosis present

## 2014-12-02 DIAGNOSIS — G8191 Hemiplegia, unspecified affecting right dominant side: Secondary | ICD-10-CM | POA: Diagnosis present

## 2014-12-02 DIAGNOSIS — Z515 Encounter for palliative care: Secondary | ICD-10-CM | POA: Diagnosis not present

## 2014-12-02 DIAGNOSIS — Z8661 Personal history of infections of the central nervous system: Secondary | ICD-10-CM

## 2014-12-02 DIAGNOSIS — Z79899 Other long term (current) drug therapy: Secondary | ICD-10-CM | POA: Diagnosis not present

## 2014-12-02 DIAGNOSIS — IMO0001 Reserved for inherently not codable concepts without codable children: Secondary | ICD-10-CM | POA: Insufficient documentation

## 2014-12-02 DIAGNOSIS — R627 Adult failure to thrive: Secondary | ICD-10-CM | POA: Diagnosis present

## 2014-12-02 DIAGNOSIS — E43 Unspecified severe protein-calorie malnutrition: Secondary | ICD-10-CM | POA: Diagnosis present

## 2014-12-02 DIAGNOSIS — N390 Urinary tract infection, site not specified: Secondary | ICD-10-CM | POA: Diagnosis present

## 2014-12-02 DIAGNOSIS — R0689 Other abnormalities of breathing: Secondary | ICD-10-CM

## 2014-12-02 DIAGNOSIS — Z87891 Personal history of nicotine dependence: Secondary | ICD-10-CM

## 2014-12-02 DIAGNOSIS — R601 Generalized edema: Secondary | ICD-10-CM | POA: Diagnosis present

## 2014-12-02 DIAGNOSIS — T68XXXA Hypothermia, initial encounter: Secondary | ICD-10-CM

## 2014-12-02 DIAGNOSIS — R4182 Altered mental status, unspecified: Secondary | ICD-10-CM

## 2014-12-02 DIAGNOSIS — R680 Hypothermia, not associated with low environmental temperature: Secondary | ICD-10-CM | POA: Diagnosis present

## 2014-12-02 DIAGNOSIS — I482 Chronic atrial fibrillation: Secondary | ICD-10-CM | POA: Diagnosis not present

## 2014-12-02 DIAGNOSIS — R0989 Other specified symptoms and signs involving the circulatory and respiratory systems: Secondary | ICD-10-CM

## 2014-12-02 LAB — CBC WITH DIFFERENTIAL/PLATELET
BASOS ABS: 0 10*3/uL (ref 0.0–0.1)
Basophils Relative: 0 % (ref 0–1)
EOS ABS: 0 10*3/uL (ref 0.0–0.7)
Eosinophils Relative: 0 % (ref 0–5)
HCT: 42.5 % (ref 39.0–52.0)
Hemoglobin: 13.7 g/dL (ref 13.0–17.0)
LYMPHS ABS: 0.3 10*3/uL — AB (ref 0.7–4.0)
Lymphocytes Relative: 6 % — ABNORMAL LOW (ref 12–46)
MCH: 33.7 pg (ref 26.0–34.0)
MCHC: 32.2 g/dL (ref 30.0–36.0)
MCV: 104.4 fL — ABNORMAL HIGH (ref 78.0–100.0)
MONO ABS: 0.2 10*3/uL (ref 0.1–1.0)
Monocytes Relative: 3 % (ref 3–12)
Neutro Abs: 4.5 10*3/uL (ref 1.7–7.7)
Neutrophils Relative %: 91 % — ABNORMAL HIGH (ref 43–77)
PLATELETS: 48 10*3/uL — AB (ref 150–400)
RBC: 4.07 MIL/uL — ABNORMAL LOW (ref 4.22–5.81)
RDW: 16.9 % — AB (ref 11.5–15.5)
WBC: 5 10*3/uL (ref 4.0–10.5)

## 2014-12-02 LAB — CBG MONITORING, ED
Glucose-Capillary: 439 mg/dL — ABNORMAL HIGH (ref 70–99)
Glucose-Capillary: 426 mg/dL — ABNORMAL HIGH (ref 70–99)

## 2014-12-02 LAB — COMPREHENSIVE METABOLIC PANEL
ALK PHOS: 354 U/L — AB (ref 39–117)
ALT: 89 U/L — AB (ref 0–53)
AST: 35 U/L (ref 0–37)
Albumin: 2 g/dL — ABNORMAL LOW (ref 3.5–5.2)
Anion gap: 9 (ref 5–15)
BUN: 48 mg/dL — AB (ref 6–23)
CO2: 25 mmol/L (ref 19–32)
CREATININE: 0.9 mg/dL (ref 0.50–1.35)
Calcium: 8.4 mg/dL (ref 8.4–10.5)
Chloride: 111 mmol/L (ref 96–112)
GFR, EST NON AFRICAN AMERICAN: 85 mL/min — AB (ref >90)
Glucose, Bld: 609 mg/dL (ref 70–99)
Potassium: 4.1 mmol/L (ref 3.5–5.1)
Sodium: 145 mmol/L (ref 135–145)
Total Bilirubin: 0.8 mg/dL (ref 0.3–1.2)
Total Protein: 4.6 g/dL — ABNORMAL LOW (ref 6.0–8.3)

## 2014-12-02 LAB — URINALYSIS, ROUTINE W REFLEX MICROSCOPIC
Glucose, UA: >1000 mg/dL — AB
Hgb urine dipstick: NEGATIVE
KETONES UR: NEGATIVE mg/dL
NITRITE: POSITIVE — AB
Protein, ur: NEGATIVE mg/dL
Specific Gravity, Urine: 1.035 — ABNORMAL HIGH (ref 1.005–1.030)
UROBILINOGEN UA: 1 mg/dL (ref 0.0–1.0)
pH: 5 (ref 5.0–8.0)

## 2014-12-02 LAB — BLOOD GAS, VENOUS
Acid-base deficit: 1.2 mmol/L (ref 0.0–2.0)
Bicarbonate: 23.8 mEq/L (ref 20.0–24.0)
FIO2: 0.21 %
O2 Saturation: 61 %
PATIENT TEMPERATURE: 98.6
PH VEN: 7.357 — AB (ref 7.250–7.300)
PO2 VEN: 38 mmHg (ref 30.0–45.0)
TCO2: 21.8 mmol/L (ref 0–100)
pCO2, Ven: 43.5 mmHg — ABNORMAL LOW (ref 45.0–50.0)

## 2014-12-02 LAB — URINE MICROSCOPIC-ADD ON

## 2014-12-02 LAB — AMMONIA: Ammonia: 50 umol/L — ABNORMAL HIGH (ref 11–32)

## 2014-12-02 LAB — PROTIME-INR
INR: 1.27 (ref 0.00–1.49)
Prothrombin Time: 16 seconds — ABNORMAL HIGH (ref 11.6–15.2)

## 2014-12-02 LAB — I-STAT CG4 LACTIC ACID, ED
Lactic Acid, Venous: 2.62 mmol/L (ref 0.5–2.0)
Lactic Acid, Venous: 3.21 mmol/L (ref 0.5–2.0)

## 2014-12-02 MED ORDER — INSULIN ASPART 100 UNIT/ML ~~LOC~~ SOLN
10.0000 [IU] | Freq: Once | SUBCUTANEOUS | Status: AC
  Administered 2014-12-02: 10 [IU] via INTRAVENOUS
  Filled 2014-12-02: qty 1

## 2014-12-02 MED ORDER — VANCOMYCIN HCL IN DEXTROSE 1-5 GM/200ML-% IV SOLN
1000.0000 mg | Freq: Two times a day (BID) | INTRAVENOUS | Status: DC
  Administered 2014-12-03: 1000 mg via INTRAVENOUS
  Filled 2014-12-02: qty 200

## 2014-12-02 MED ORDER — SODIUM CHLORIDE 0.9 % IV BOLUS (SEPSIS)
1000.0000 mL | INTRAVENOUS | Status: AC
  Administered 2014-12-02 (×2): 1000 mL via INTRAVENOUS

## 2014-12-02 MED ORDER — VANCOMYCIN HCL IN DEXTROSE 1-5 GM/200ML-% IV SOLN
1000.0000 mg | Freq: Once | INTRAVENOUS | Status: AC
  Administered 2014-12-02: 1000 mg via INTRAVENOUS
  Filled 2014-12-02: qty 200

## 2014-12-02 MED ORDER — SODIUM CHLORIDE 0.9 % IV BOLUS (SEPSIS)
1000.0000 mL | Freq: Once | INTRAVENOUS | Status: AC
  Administered 2014-12-02: 1000 mL via INTRAVENOUS

## 2014-12-02 MED ORDER — SODIUM CHLORIDE 0.9 % IV BOLUS (SEPSIS): 500.0000 mL | INTRAVENOUS | Status: AC

## 2014-12-02 MED ORDER — PIPERACILLIN-TAZOBACTAM 3.375 G IVPB
3.3750 g | Freq: Three times a day (TID) | INTRAVENOUS | Status: DC
  Administered 2014-12-03 (×2): 3.375 g via INTRAVENOUS
  Filled 2014-12-02 (×2): qty 50

## 2014-12-02 MED ORDER — PIPERACILLIN-TAZOBACTAM 3.375 G IVPB 30 MIN
3.3750 g | Freq: Once | INTRAVENOUS | Status: AC
  Administered 2014-12-02: 3.375 g via INTRAVENOUS
  Filled 2014-12-02: qty 50

## 2014-12-02 NOTE — Telephone Encounter (Signed)
LATE ENTRY- RETURN CALL FROM VOICE MAIL AT 12:28PM.

## 2014-12-02 NOTE — Progress Notes (Signed)
ANTIBIOTIC CONSULT NOTE - INITIAL  Pharmacy Consult for Vancomycin & Zosyn Indication: rule out sepsis  No Known Allergies  Patient Measurements:   Total body weight: 83.5 kg  Vital Signs: Temp: 91.5 F (33.1 C) (03/04 2002) Temp Source: Rectal (03/04 2002) BP: 111/78 mmHg (03/04 2000) Pulse Rate: 43 (03/04 2000) Intake/Output from previous day:   Intake/Output from this shift:    Labs: No results for input(s): WBC, HGB, PLT, LABCREA, CREATININE in the last 72 hours. CrCl cannot be calculated (Patient has no serum creatinine result on file.). No results for input(s): VANCOTROUGH, VANCOPEAK, VANCORANDOM, GENTTROUGH, GENTPEAK, GENTRANDOM, TOBRATROUGH, TOBRAPEAK, TOBRARND, AMIKACINPEAK, AMIKACINTROU, AMIKACIN in the last 72 hours.   Microbiology: No results found for this or any previous visit (from the past 720 hour(s)).  Medical History: Past Medical History  Diagnosis Date  . GERD (gastroesophageal reflux disease)   . Hyperlipemia   . Barrett's esophagus 12/2013  . History of radiation therapy 02/23/14- 04/04/14    esophagus 5040 cGy 28 sessions  . 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 Hypertension   . 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   Medications:  Scheduled:   Anti-infectives    Start     Dose/Rate Route Frequency Ordered Stop   12/03/14 1000  vancomycin (VANCOCIN) IVPB 1000 mg/200 mL premix     1,000 mg 200 mL/hr over 60 Minutes Intravenous Every 12 hours 12/02/14 2138     12/03/14 0400  piperacillin-tazobactam (ZOSYN) IVPB 3.375 g     3.375 g 12.5 mL/hr over 240 Minutes Intravenous Every 8 hours 12/02/14 2136     12/02/14 2045  piperacillin-tazobactam (ZOSYN) IVPB 3.375 g     3.375 g 100 mL/hr over 30 Minutes Intravenous  Once 12/02/14 2033     12/02/14 2045  vancomycin (VANCOCIN) IVPB 1000 mg/200 mL premix     1,000 mg 200 mL/hr over 60 Minutes Intravenous  Once 12/02/14 2033       Assessment: 51 yoM admitted with elevated serum glucose,  no hx of DM, on Dexamethasone for brain mets. Hx of esophageal Ca, chemo completed 12/15. Craniotomy 10/06/14 with pus noted, culture grew Eikenella corrodens, treated with Rocephin/Flagyl ordered x 12 weeks, ID MD note 2/19 noted patient at week 5/12 of antibiotics. Per Med Rec appears still taking Flagyl po, but that pt was discharged from SNF [facility]) without Rx for Rocephin.  Begin Vancomycin and Zosyn presumed sepsis  Goal of Therapy:  Vancomycin trough level 15-20 mcg/ml  Plan:   Zosyn 3.375 gm q8hr-4hr infusion  Vancomycin 1000mg  q12hr  Minda Ditto PharmD Pager [phone] 12/02/2014, 9:50 PM

## 2014-12-02 NOTE — ED Notes (Signed)
MD at bedside. 

## 2014-12-02 NOTE — Telephone Encounter (Signed)
PT. IS CONFUSED AT TIMES. IF ASKED PT. STATES HE HURTS ALL OVER BUT TAKES ONLY ONE TRAMADOL A DAY. HE IS COUGHING OCCASIONALLY. ON 11/14/14 PT. HAD A RIGHT LEG DVT WITH A FILTER PLACEMENT. PT.'S GRANDDAUGHTER WOULD LIKE TO SCHEDULE AN APPOINTMENT FOR PT. WITH DR.[name] FOR NEXT WEEK. VERBAL ORDER AND READ BACK TO ADRENA JOHNSON,PA- PT. NEEDS TO GO TO THE EMERGENCY ROOM FOR EVALUATION. NOTIFIED PT.'S GRANDDAUGHTER OF THE ABOVE INSTRUCTIONS. SHE VOICES UNDERSTANDING.

## 2014-12-02 NOTE — Progress Notes (Addendum)
CSW spoke with granddaughter in [place] conference room. Physician present. Granddaughter states that she is POA and is currently the only family member. Pt lives with granddaughter.  However, there are no POA papers on file.   CSW received permission from granddaughter to call her husband requesting him to bring the POA paperwork from home. CSW reached out to husband. However, he did not answer the phone. CSW left a message asking the husband of the granddaughter to please give his wife a call back.  Willette Brace [phone] ED CSW 12/02/2014 11:58 PM

## 2014-12-02 NOTE — Progress Notes (Addendum)
EDCM spoke to patient's grand daughter [name] [name] [phone] at bedside.  Patient is resting.  Patient's grand daughter confirms she is the patient's POA.  EDCM asked patient's grand daughter if she had paperwork stating she is the POA?  She stated,"I don't have them on me, but you should have it in the system, because we filled it out here."  Patient lives at home with his grand daughter and her husband.  Patient has a hospital bed, hoyer lift and wheelchair at home.  Per patient's grand daughter, patient has not walked since December.  Patient was discharged from [facility] last Wednesday per patient's grand daughter.  Patient was set up to have home health services with [facility] for PT, OT and RN.  Patient's grand daughter reports, "They only come for a half hour once a week."  Patient's daughter went on to say that, "I hired an Engineer, production, my friend comes in the morning and [facility] comes at 1pm to 6pm."  [facility] asked patient's grand daughter if patient was a full code?  Patient's grand daughter asked, "What's that?"  EDCM explained to her that in an emergency situation we would push on his chest (CPR) and may place a tube down his throat to help him breathe.  Patient's grand daughter stated, "Yes."  EDCM asked patient's grand daughter if anyone has approached her about hospice care or palliative care for the patient?   Patient's grand daughter responded, "Well, hospice was supposed to come out to the house today, but I don't think they ever came."  Patient's grand daughter reports it was [facility]. Patient's grand daughter agreeable to speak to someone regarding palliative care and hospice care as care options for the patient.  EDCM provided patient's daughter with list of home health agencies in [place] highlighting [place].  Also provided patient's grand daughter with list of home hospice agencies.  EDCM offered patient's grand daughter support.  Patient's grand daughter thankful for  services.  No further EDCM needs at this time.

## 2014-12-02 NOTE — ED Notes (Addendum)
Patient receiving IV fluids via fluid warmer due to hypothermia.  Bair hugger also placed on patient. Temperature monitored via rectal probe.

## 2014-12-02 NOTE — ED Provider Notes (Signed)
CSN: [id number]     Arrival date & time 12/02/14  1903 History   First MD Initiated Contact with Patient 12/02/14 1945     Chief Complaint  Patient presents with  . Cancer  . 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  Granddaughter who is healthcare power of attorney, reports that he has been with her for the past one and half weeks since being discharged from [facility].  Since he has become more confused, less alert, less verbal has been eating and drinking less. She does not think he has had fever, cough, n/v, d/a, dec UOP. He finished last dose of abx, today for a pneumonia?  Patient is a 69 y.o. male presenting with hyperglycemia.  Hyperglycemia Associated symptoms: fatigue   Associated symptoms: no abdominal pain, no chest pain, no confusion, no dizziness, no dysuria, no fever, no increased thirst, no nausea, no polyuria, no shortness of breath, no vomiting and no weakness     Past Medical History  Diagnosis Date  . GERD (gastroesophageal reflux disease)   . Hyperlipemia   . Barrett's esophagus 12/2013  . History of radiation therapy 02/23/14- 04/04/14    esophagus 5040 cGy 28 sessions  . 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 Hypertension   . 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 Eus N/A 02/03/2014    Procedure: UPPER ENDOSCOPIC ULTRASOUND (EUS) LINEAR;  Surgeon: Milus Banister, MD;  Location: [facility] ENDOSCOPY;  Service: Endoscopy;  Laterality: N/A;  . Craniotomy N/A 10/06/2014    Procedure: CRANIOTOMY TUMOR EXCISION WITH CURVE;  Surgeon: [name], MD;  Location: [facility] NEURO ORS;  Service: Neurosurgery;  Laterality: N/A;  CRANIOTOMY  TUMOR EXCISION WITH CURVE   Family History  Problem Relation Age of Onset  . Breast cancer Sister    History  Substance Use Topics  . Smoking status: Former Smoker -- 2.00 packs/day for 35 years    Types: Cigarettes    Quit date: 09/30/1988  . Smokeless tobacco: Never Used     Comment: pt thinks he stopped smoking in 1990s  . Alcohol Use: Yes     Comment: occasional, history of alcohol abuse    Review of Systems  Constitutional: Positive for activity change, appetite change and fatigue. Negative for fever.  HENT: Negative for congestion, facial swelling, rhinorrhea and trouble swallowing.   Eyes: Negative for photophobia and pain.  Respiratory: Negative for cough, chest tightness and shortness of breath.   Cardiovascular: Negative for chest pain and leg swelling.  Gastrointestinal: Negative for nausea, vomiting, abdominal pain, diarrhea and constipation.  Endocrine: Negative for polydipsia and polyuria.  Genitourinary: Negative for dysuria, urgency, decreased urine volume and difficulty urinating.  Musculoskeletal: Negative for back pain and gait problem.  Skin: Negative for color change, rash and wound.  Allergic/Immunologic: Negative for immunocompromised state.  Neurological: Negative for dizziness, facial asymmetry, speech difficulty, weakness, numbness and headaches.  Psychiatric/Behavioral: Positive for decreased concentration. Negative for confusion and agitation.      Allergies  Review of patient's allergies indicates no known allergies.  Home Medications   Prior to Admission medications   Medication Sig Start Date End Date Taking? Authorizing Provider  acetaminophen (TYLENOL) 325 MG tablet Take 2 tablets (650 mg  total) by mouth every 4 (four) hours as needed for mild pain (temp > 100.5). 10/11/14  Yes Estela Leonie Green, MD  albuterol (PROVENTIL HFA;VENTOLIN HFA) 108 (90 BASE) MCG/ACT inhaler Inhale into the lungs every 6 (six) hours as needed for wheezing or  shortness of breath.   Yes Historical Provider, MD  dexamethasone (DECADRON) 4 MG tablet Take 1 tablet (4 mg total) by mouth every 6 (six) hours. 10/11/14  Yes Erline Hau, MD  finasteride (PROSCAR) 5 MG tablet Take 5 mg by mouth daily.   Yes Historical Provider, MD  lacosamide 100 MG TABS Take 1 tablet (100 mg total) by mouth 2 (two) times daily. 10/11/14  Yes Estela Leonie Green, MD  levETIRAcetam (KEPPRA) 750 MG tablet Take 2 tablets (1,500 mg total) by mouth 2 (two) times daily. 10/11/14  Yes Erline Hau, MD  levofloxacin (LEVAQUIN) 750 MG tablet Take 750 mg by mouth daily. 7 day regimen 11/17/14-11/23/14   Yes Historical Provider, MD  metroNIDAZOLE (FLAGYL) 500 MG tablet Take 1 tablet (500 mg total) by mouth every 6 (six) hours. 10/11/14 12/05/14 Yes Estela Leonie Green, MD  mirtazapine (REMERON) 7.5 MG tablet Take 7.5 mg by mouth at bedtime.   Yes Historical Provider, MD  omeprazole (PRILOSEC) 20 MG capsule Take 20 mg by mouth daily.   Yes Historical Provider, MD  oxyCODONE (OXY IR/ROXICODONE) 5 MG immediate release tablet Take 1 tablet (5 mg total) by mouth every 4 (four) hours as needed for moderate pain. 10/11/14  Yes Erline Hau, MD  tiotropium (SPIRIVA) 18 MCG inhalation capsule Place 1 capsule (18 mcg total) into inhaler and inhale daily. 08/18/14  Yes Brand Males, MD  traMADol (ULTRAM) 50 MG tablet Take 50 mg by mouth every 6 (six) hours as needed for moderate pain or severe pain.   Yes Historical Provider, MD  cefTRIAXone (ROCEPHIN) 40 MG/ML IVPB Inject 50 mLs (2 g total) into the vein every 12 (twelve) hours. Patient not taking: Reported on 11/18/2014 10/11/14 12/05/14  Erline Hau, MD  pantoprazole (PROTONIX) 40 MG tablet Take 1 tablet (40 mg total) by mouth daily at 12 noon. Patient not taking: Reported on 12/02/2014 10/11/14   Erline Hau, MD  warfarin (COUMADIN) 2 MG tablet Take 2 mg by mouth daily. For positive  DVT right leg    Historical Provider, MD   BP 106/69 mmHg  Pulse 131  Temp(Src) 93.5 F (34.2 C) (Rectal)  Resp 39  Ht 5\' 4"  (1.626 m)  Wt 180 lb (81.647 kg)  BMI 30.88 kg/m2  SpO2 93% Physical Exam  Constitutional: He is oriented to person, place, and time. He appears well-developed and well-nourished. No distress.  HENT:  Head: Normocephalic and atraumatic.  Mouth/Throat: No oropharyngeal exudate.  Eyes: Pupils are equal, round, and reactive to light.  Neck: Normal range of motion. Neck supple.  Cardiovascular: Regular rhythm and normal heart sounds.  Tachycardia present.  Exam reveals no gallop and no friction rub.   No murmur heard. Pulmonary/Chest: Effort normal and breath sounds normal. No respiratory distress. He has no wheezes. He has no rales.  Abdominal: Soft. Bowel sounds are normal. He exhibits no distension and no mass. There is no tenderness. There is no rebound and no guarding.  Musculoskeletal: Normal range of motion. He exhibits no edema or tenderness.       Legs: Neurological: He is alert and oriented to person, place, and time. No cranial nerve deficit  or sensory deficit. He exhibits abnormal muscle tone. GCS eye subscore is 4. GCS verbal subscore is 3. GCS motor subscore is 6.  R sided hemiparesis  Skin: Skin is warm and dry.  Psychiatric: He has a normal mood and affect.    ED Course  Procedures (including critical care time) Labs Review Labs Reviewed  CBC WITH DIFFERENTIAL/PLATELET - Abnormal; Notable for the following:    RBC 4.07 (*)    MCV 104.4 (*)    RDW 16.9 (*)    Platelets 48 (*)    Neutrophils Relative % 91 (*)    Lymphocytes Relative 6 (*)    Lymphs Abs 0.3 (*)    All other components within normal limits  COMPREHENSIVE METABOLIC PANEL - Abnormal; Notable for the following:    Glucose, Bld 609 (*)    BUN 48 (*)    Total Protein 4.6 (*)    Albumin 2.0 (*)    ALT 89 (*)    Alkaline Phosphatase 354 (*)    GFR calc non Af Amer 85 (*)     All other components within normal limits  URINALYSIS, ROUTINE W REFLEX MICROSCOPIC - Abnormal; Notable for the following:    Color, Urine BROWN (*)    Specific Gravity, Urine 1.035 (*)    Glucose, UA >1000 (*)    Bilirubin Urine SMALL (*)    Nitrite POSITIVE (*)    Leukocytes, UA SMALL (*)    All other components within normal limits  AMMONIA - Abnormal; Notable for the following:    Ammonia 50 (*)    All other components within normal limits  BLOOD GAS, VENOUS - Abnormal; Notable for the following:    pH, Ven 7.357 (*)    pCO2, Ven 43.5 (*)    All other components within normal limits  PROTIME-INR - Abnormal; Notable for the following:    Prothrombin Time 16.0 (*)    All other components within normal limits  I-STAT CG4 LACTIC ACID, ED - Abnormal; Notable for the following:    Lactic Acid, Venous 2.62 (*)    All other components within normal limits  I-STAT CG4 LACTIC ACID, ED - Abnormal; Notable for the following:    Lactic Acid, Venous 3.21 (*)    All other components within normal limits  CBG MONITORING, ED - Abnormal; Notable for the following:    Glucose-Capillary 439 (*)    All other components within normal limits  CBG MONITORING, ED - Abnormal; Notable for the following:    Glucose-Capillary 426 (*)    All other components within normal limits  URINE CULTURE  CULTURE, BLOOD (ROUTINE X 2)  CULTURE, BLOOD (ROUTINE X 2)  URINE MICROSCOPIC-ADD ON  I-STAT CG4 LACTIC ACID, ED    Imaging Review Dg Chest 1 View  12/02/2014   CLINICAL DATA:  Altered mental status, recent pneumonia  EXAM: CHEST  1 VIEW  COMPARISON:  09/28/2014  FINDINGS: There is a right IJ porta catheter with tip at the upper right atrium.  Mild cardiomegaly which is chronic. Stable and normal heart size and mediastinal contours. There are chronic reticular opacities in the lower lungs with pleural thickening and probable fissural fluid on the right. No definite superimposed pneumonia or edema.  IMPRESSION:  Chronic lung disease without definite superimposed pneumonia.   Electronically Signed   By: Monte Fantasia M.D.   On: 12/02/2014 21:10   Ct Head Wo Contrast  12/02/2014   CLINICAL DATA:  Altered mental status. Metastatic pancreatic and esophageal  carcinomas. Previous resection of brain metastasis.  EXAM: CT HEAD WITHOUT CONTRAST  TECHNIQUE: Contiguous axial images were obtained from the base of the skull through the vertex without intravenous contrast.  COMPARISON:  Brain MRI on 10/07/2014  FINDINGS: There is no evidence of intracranial hemorrhage, brain edema, or other signs of acute infarction. There is no evidence of intracranial mass lesion or mass effect. No abnormal extraaxial fluid collections are identified.  Mild encephalomalacia is seen in the left parietal lobe at site of prior craniotomy. Ventricles stable in size. Mild chronic small vessel disease again noted.  IMPRESSION: No acute intracranial abnormality.  Mild chronic small vessel disease. Prior left parietal craniotomy and mild encephalomalacia.   Electronically Signed   By: Earle Gell M.D.   On: 12/02/2014 21:29     EKG Interpretation None     CRITICAL CARE Performed by: Ernestina Patches, E Total critical care time: 48mins Critical care time was exclusive of separately billable procedures and treating other patients. Critical care was necessary to treat or prevent imminent or life-threatening deterioration. Critical care was time spent personally by me on the following activities: development of treatment plan with patient and/or surrogate as well as nursing, discussions with consultants, evaluation of patient's response to treatment, examination of patient, obtaining history from patient or surrogate, ordering and performing treatments and interventions, ordering and review of laboratory studies, ordering and review of radiographic studies, pulse oximetry and re-evaluation of patient's condition.   MDM   Final diagnoses:    Rales  Sepsis, due to unspecified organism  Pyelonephritis  Hyperglycemia  Hypothermia, initial encounter    Pt is a 70 y.o. 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  Granddaughter who is healthcare power of attorney, reports that he has been with her for the past one and half weeks since being discharged from [facility].  Since he has become more confused, less alert, less verbal has been eating and drinking less.  On exam, patient is hypothermic tachycardic, tachypneic, was only nod yes or no to questions.  Strength is decreased throughout, with right-sided hemiparesis, which is chronic since brain abscess and craniotomy.  Could sepsis protocol initiated.  Patient has elevated lactate 2.46.  Glucose elevated at 609, which is likely due to his recent Decadron use .  Anion gap is normal .  1010 units IV insulin given . IV fluid resuscitation is began, he begins having hypotensive blood pressure readings.  Source of sepsis is likely due to indwelling Foley catheter.  CT head showed no acute changes.  Chest x-ray showed no acute changes.  Patient started on warming blanket with minimal improvement.   He has had worseningTachycardia during ED visit, EKG with likely a fib LBBB. Pt denies pain including CP.    BP somewhat improved with IVF resuscitation.  I spoke with critical care medicine, Dr. Dedderding, who recommended additional 1 L normal saline (3 given with initial bolus), and consult triad.  I had 2 discussions with the granddaughter, about the severity of patient's current illness, and my concern that he likely has an overwhelming sepsis which will be fatal.  I explained, Central line insertion intubation and use a ventilator, as well as CPR.  I also explained that it is my belief that given his  current very frail state, these events, interventions would not likely change the  outcome.  Granddaughter states that the patient would want to be full code.  Spoke with Dr. Arnoldo Morale of triad, who will admit to stepdown.        Ernestina Patches, MD 12/02/14 2356

## 2014-12-02 NOTE — ED Notes (Signed)
Pt's caregiver which is her granddaughter, was asked about pt's code status and she stated that "he wanted everything done"

## 2014-12-02 NOTE — ED Notes (Signed)
Dr. Jenkins at the bedside.  

## 2014-12-02 NOTE — ED Notes (Signed)
Patient denies pain and is resting comfortably.  

## 2014-12-02 NOTE — ED Notes (Signed)
Bed: WA15 Expected date:  Expected time:  Means of arrival:  Comments: EMS/hyperglycemia 

## 2014-12-02 NOTE — ED Notes (Signed)
Attempted to obtain second set of blood cultures.  Unable to obtain at present due to swelling in arms bilaterally.

## 2014-12-02 NOTE — ED Notes (Addendum)
Pt from home via EMS-Per EMS, pt caregiver called after she feels that pt has been declining. Pt has hx of end stage pancreatic CA. Pt CBG >600 w/o hx of DM. Pt alert and in NAD. Pt is a full code per pt caregiver. Pt is currently taking steroids for dx of PNA

## 2014-12-03 DIAGNOSIS — A419 Sepsis, unspecified organism: Secondary | ICD-10-CM | POA: Diagnosis present

## 2014-12-03 DIAGNOSIS — J189 Pneumonia, unspecified organism: Secondary | ICD-10-CM | POA: Diagnosis present

## 2014-12-03 DIAGNOSIS — I4891 Unspecified atrial fibrillation: Secondary | ICD-10-CM

## 2014-12-03 DIAGNOSIS — N390 Urinary tract infection, site not specified: Secondary | ICD-10-CM

## 2014-12-03 DIAGNOSIS — R0600 Dyspnea, unspecified: Secondary | ICD-10-CM | POA: Insufficient documentation

## 2014-12-03 DIAGNOSIS — E43 Unspecified severe protein-calorie malnutrition: Secondary | ICD-10-CM

## 2014-12-03 DIAGNOSIS — R1310 Dysphagia, unspecified: Secondary | ICD-10-CM

## 2014-12-03 DIAGNOSIS — T68XXXA Hypothermia, initial encounter: Secondary | ICD-10-CM | POA: Diagnosis present

## 2014-12-03 DIAGNOSIS — R601 Generalized edema: Secondary | ICD-10-CM

## 2014-12-03 DIAGNOSIS — C159 Malignant neoplasm of esophagus, unspecified: Secondary | ICD-10-CM

## 2014-12-03 DIAGNOSIS — Z515 Encounter for palliative care: Secondary | ICD-10-CM

## 2014-12-03 DIAGNOSIS — R6521 Severe sepsis with septic shock: Secondary | ICD-10-CM

## 2014-12-03 DIAGNOSIS — IMO0001 Reserved for inherently not codable concepts without codable children: Secondary | ICD-10-CM | POA: Insufficient documentation

## 2014-12-03 DIAGNOSIS — N12 Tubulo-interstitial nephritis, not specified as acute or chronic: Secondary | ICD-10-CM | POA: Insufficient documentation

## 2014-12-03 DIAGNOSIS — G9340 Encephalopathy, unspecified: Secondary | ICD-10-CM

## 2014-12-03 DIAGNOSIS — I959 Hypotension, unspecified: Secondary | ICD-10-CM

## 2014-12-03 DIAGNOSIS — R0689 Other abnormalities of breathing: Secondary | ICD-10-CM

## 2014-12-03 DIAGNOSIS — R739 Hyperglycemia, unspecified: Secondary | ICD-10-CM

## 2014-12-03 LAB — CBC
HEMATOCRIT: 35.2 % — AB (ref 39.0–52.0)
HEMATOCRIT: 31.9 % — AB (ref 39.0–52.0)
Hemoglobin: 11.4 g/dL — ABNORMAL LOW (ref 13.0–17.0)
Hemoglobin: 10.4 g/dL — ABNORMAL LOW (ref 13.0–17.0)
MCH: 33.6 pg (ref 26.0–34.0)
MCH: 34.1 pg — AB (ref 26.0–34.0)
MCHC: 32.4 g/dL (ref 30.0–36.0)
MCHC: 32.6 g/dL (ref 30.0–36.0)
MCV: 103.8 fL — ABNORMAL HIGH (ref 78.0–100.0)
MCV: 104.6 fL — ABNORMAL HIGH (ref 78.0–100.0)
Platelets: 41 10*3/uL — ABNORMAL LOW (ref 150–400)
Platelets: 44 10*3/uL — ABNORMAL LOW (ref 150–400)
RBC: 3.39 MIL/uL — AB (ref 4.22–5.81)
RBC: 3.05 MIL/uL — ABNORMAL LOW (ref 4.22–5.81)
RDW: 17 % — AB (ref 11.5–15.5)
RDW: 17.1 % — ABNORMAL HIGH (ref 11.5–15.5)
WBC: 4.2 10*3/uL (ref 4.0–10.5)
WBC: 5.7 10*3/uL (ref 4.0–10.5)

## 2014-12-03 LAB — TROPONIN I
TROPONIN I: 0.03 ng/mL (ref <0.031)
TROPONIN I: 0.09 ng/mL — AB (ref <0.031)
Troponin I: 0.05 ng/mL — ABNORMAL HIGH (ref <0.031)
Troponin I: 0.06 ng/mL — ABNORMAL HIGH (ref <0.031)
Troponin I: 0.09 ng/mL — ABNORMAL HIGH (ref <0.031)

## 2014-12-03 LAB — BASIC METABOLIC PANEL
Anion gap: 9 (ref 5–15)
Anion gap: 8 (ref 5–15)
BUN: 40 mg/dL — ABNORMAL HIGH (ref 6–23)
BUN: 40 mg/dL — ABNORMAL HIGH (ref 6–23)
CHLORIDE: 119 mmol/L — AB (ref 96–112)
CO2: 17 mmol/L — ABNORMAL LOW (ref 19–32)
CO2: 19 mmol/L (ref 19–32)
Calcium: 7 mg/dL — ABNORMAL LOW (ref 8.4–10.5)
Calcium: 7.6 mg/dL — ABNORMAL LOW (ref 8.4–10.5)
Chloride: 118 mmol/L — ABNORMAL HIGH (ref 96–112)
Creatinine, Ser: 0.82 mg/dL (ref 0.50–1.35)
Creatinine, Ser: 0.86 mg/dL (ref 0.50–1.35)
GFR calc Af Amer: >90 mL/min (ref >90)
GFR calc Af Amer: >90 mL/min (ref >90)
GFR, EST NON AFRICAN AMERICAN: 88 mL/min — AB (ref >90)
GFR, EST NON AFRICAN AMERICAN: 86 mL/min — AB (ref >90)
GLUCOSE: 279 mg/dL — AB (ref 70–99)
Glucose, Bld: 424 mg/dL — ABNORMAL HIGH (ref 70–99)
Potassium: 3.5 mmol/L (ref 3.5–5.1)
Potassium: 3.5 mmol/L (ref 3.5–5.1)
Sodium: 144 mmol/L (ref 135–145)
Sodium: 146 mmol/L — ABNORMAL HIGH (ref 135–145)

## 2014-12-03 LAB — GLUCOSE, CAPILLARY
GLUCOSE-CAPILLARY: 248 mg/dL — AB (ref 70–99)
GLUCOSE-CAPILLARY: 125 mg/dL — AB (ref 70–99)
GLUCOSE-CAPILLARY: 126 mg/dL — AB (ref 70–99)
Glucose-Capillary: 361 mg/dL — ABNORMAL HIGH (ref 70–99)
Glucose-Capillary: 365 mg/dL — ABNORMAL HIGH (ref 70–99)
Glucose-Capillary: 233 mg/dL — ABNORMAL HIGH (ref 70–99)
Glucose-Capillary: 330 mg/dL — ABNORMAL HIGH (ref 70–99)
Glucose-Capillary: 297 mg/dL — ABNORMAL HIGH (ref 70–99)
Glucose-Capillary: 257 mg/dL — ABNORMAL HIGH (ref 70–99)
Glucose-Capillary: 108 mg/dL — ABNORMAL HIGH (ref 70–99)

## 2014-12-03 LAB — LACTIC ACID, PLASMA
LACTIC ACID, VENOUS: 1.4 mmol/L (ref 0.5–2.0)
Lactic Acid, Venous: 4.9 mmol/L (ref 0.5–2.0)
Lactic Acid, Venous: 4.9 mmol/L (ref 0.5–2.0)

## 2014-12-03 LAB — MAGNESIUM: MAGNESIUM: 1.5 mg/dL (ref 1.5–2.5)

## 2014-12-03 LAB — MRSA PCR SCREENING: MRSA BY PCR: NEGATIVE

## 2014-12-03 LAB — TYPE AND SCREEN
ABO/RH(D): O POS
Antibody Screen: NEGATIVE

## 2014-12-03 MED ORDER — ALBUMIN HUMAN 25 % IV SOLN
50.0000 g | Freq: Three times a day (TID) | INTRAVENOUS | Status: DC
  Administered 2014-12-03 (×2): 50 g via INTRAVENOUS
  Filled 2014-12-03: qty 150
  Filled 2014-12-03: qty 50
  Filled 2014-12-03: qty 150
  Filled 2014-12-03: qty 50

## 2014-12-03 MED ORDER — ACETAMINOPHEN 325 MG PO TABS
650.0000 mg | ORAL_TABLET | Freq: Four times a day (QID) | ORAL | Status: DC | PRN
Start: 2014-12-03 — End: 2014-12-03

## 2014-12-03 MED ORDER — SODIUM CHLORIDE 0.9 % IV BOLUS (SEPSIS)
500.0000 mL | Freq: Once | INTRAVENOUS | Status: AC
  Administered 2014-12-03: 500 mL via INTRAVENOUS

## 2014-12-03 MED ORDER — PANTOPRAZOLE SODIUM 40 MG PO TBEC: 40.0000 mg | DELAYED_RELEASE_TABLET | Freq: Every day | ORAL | Status: DC

## 2014-12-03 MED ORDER — SODIUM CHLORIDE 0.9 % IV SOLN: INTRAVENOUS | Status: DC

## 2014-12-03 MED ORDER — POTASSIUM CHLORIDE 10 MEQ/50ML IV SOLN
10.0000 meq | INTRAVENOUS | Status: AC
  Administered 2014-12-03 (×4): 10 meq via INTRAVENOUS
  Filled 2014-12-03: qty 50

## 2014-12-03 MED ORDER — ONDANSETRON HCL 4 MG/2ML IJ SOLN: 4.0000 mg | Freq: Four times a day (QID) | INTRAMUSCULAR | Status: DC | PRN

## 2014-12-03 MED ORDER — PHENYLEPHRINE HCL 10 MG/ML IJ SOLN
30.0000 ug/min | INTRAVENOUS | Status: DC
  Administered 2014-12-03: 30 ug/min via INTRAVENOUS
  Administered 2014-12-03: 90 ug/min via INTRAVENOUS
  Administered 2014-12-03: 60 ug/min via INTRAVENOUS
  Administered 2014-12-03: 95 ug/min via INTRAVENOUS
  Administered 2014-12-03: 50 ug/min via INTRAVENOUS
  Administered 2014-12-03: 70 ug/min via INTRAVENOUS
  Filled 2014-12-03 (×6): qty 1

## 2014-12-03 MED ORDER — SODIUM CHLORIDE 0.9 % IV SOLN
INTRAVENOUS | Status: DC
  Administered 2014-12-03: 02:00:00 via INTRAVENOUS

## 2014-12-03 MED ORDER — INSULIN REGULAR HUMAN 100 UNIT/ML IJ SOLN
INTRAMUSCULAR | Status: DC
  Administered 2014-12-03: 3.1 [IU]/h via INTRAVENOUS
  Filled 2014-12-03: qty 2.5

## 2014-12-03 MED ORDER — AMIODARONE HCL IN DEXTROSE 360-4.14 MG/200ML-% IV SOLN
INTRAVENOUS | Status: AC
  Filled 2014-12-03: qty 200

## 2014-12-03 MED ORDER — AMIODARONE HCL IN DEXTROSE 360-4.14 MG/200ML-% IV SOLN
60.0000 mg/h | INTRAVENOUS | Status: DC
  Administered 2014-12-03 (×2): 60 mg/h via INTRAVENOUS
  Filled 2014-12-03: qty 200

## 2014-12-03 MED ORDER — INSULIN REGULAR BOLUS VIA INFUSION
0.0000 [IU] | Freq: Three times a day (TID) | INTRAVENOUS | Status: DC
  Filled 2014-12-03: qty 10

## 2014-12-03 MED ORDER — AMIODARONE LOAD VIA INFUSION
150.0000 mg | Freq: Once | INTRAVENOUS | Status: AC
  Administered 2014-12-03: 150 mg via INTRAVENOUS
  Filled 2014-12-03: qty 83.34

## 2014-12-03 MED ORDER — SODIUM CHLORIDE 0.9 % IJ SOLN: 3.0000 mL | Freq: Two times a day (BID) | INTRAMUSCULAR | Status: DC

## 2014-12-03 MED ORDER — SODIUM CHLORIDE 0.9 % IV SOLN
100.0000 mg | Freq: Two times a day (BID) | INTRAVENOUS | Status: DC
  Administered 2014-12-03 (×3): 100 mg via INTRAVENOUS
  Filled 2014-12-03 (×6): qty 10

## 2014-12-03 MED ORDER — DEXAMETHASONE 2 MG PO TABS: 4.0000 mg | ORAL_TABLET | Freq: Four times a day (QID) | ORAL | Status: DC

## 2014-12-03 MED ORDER — LEVETIRACETAM 500 MG PO TABS: 1500.0000 mg | ORAL_TABLET | Freq: Two times a day (BID) | ORAL | Status: DC

## 2014-12-03 MED ORDER — AMIODARONE HCL IN DEXTROSE 360-4.14 MG/200ML-% IV SOLN
30.0000 mg/h | INTRAVENOUS | Status: DC
  Administered 2014-12-03: 30 mg/h via INTRAVENOUS
  Filled 2014-12-03: qty 200

## 2014-12-03 MED ORDER — ATROPINE SULFATE 1 % OP SOLN
2.0000 [drp] | OPHTHALMIC | Status: DC | PRN
  Administered 2014-12-03 – 2014-12-04 (×3): 2 [drp] via SUBLINGUAL
  Filled 2014-12-03: qty 2

## 2014-12-03 MED ORDER — ALBUTEROL SULFATE (2.5 MG/3ML) 0.083% IN NEBU: 2.5000 mg | INHALATION_SOLUTION | Freq: Four times a day (QID) | RESPIRATORY_TRACT | Status: DC | PRN

## 2014-12-03 MED ORDER — PANTOPRAZOLE SODIUM 40 MG IV SOLR: 40.0000 mg | INTRAVENOUS | Status: DC

## 2014-12-03 MED ORDER — HYDROMORPHONE HCL 1 MG/ML IJ SOLN
0.5000 mg | INTRAMUSCULAR | Status: DC | PRN
  Administered 2014-12-03 (×3): 1 mg via INTRAVENOUS
  Filled 2014-12-03 (×3): qty 1

## 2014-12-03 MED ORDER — HYDROMORPHONE HCL 1 MG/ML IJ SOLN
0.5000 mg | INTRAMUSCULAR | Status: DC | PRN
  Administered 2014-12-03 – 2014-12-06 (×5): 1 mg via INTRAVENOUS
  Filled 2014-12-03 (×5): qty 1

## 2014-12-03 MED ORDER — LACOSAMIDE 50 MG PO TABS: 100.0000 mg | ORAL_TABLET | Freq: Two times a day (BID) | ORAL | Status: DC

## 2014-12-03 MED ORDER — DEXTROSE-NACL 5-0.45 % IV SOLN
INTRAVENOUS | Status: DC
Start: 2014-12-03 — End: 2014-12-04
  Administered 2014-12-03: 1000 mL via INTRAVENOUS

## 2014-12-03 MED ORDER — ONDANSETRON HCL 4 MG PO TABS: 4.0000 mg | ORAL_TABLET | Freq: Four times a day (QID) | ORAL | Status: DC | PRN

## 2014-12-03 MED ORDER — OXYCODONE HCL 5 MG PO TABS: 5.0000 mg | ORAL_TABLET | ORAL | Status: DC | PRN

## 2014-12-03 MED ORDER — DEXAMETHASONE SODIUM PHOSPHATE 4 MG/ML IJ SOLN
4.0000 mg | Freq: Four times a day (QID) | INTRAMUSCULAR | Status: DC
  Administered 2014-12-03 (×4): 4 mg via INTRAVENOUS
  Filled 2014-12-03 (×3): qty 1

## 2014-12-03 MED ORDER — SODIUM CHLORIDE 0.9 % IV SOLN: 250.0000 mL | INTRAVENOUS | Status: DC | PRN

## 2014-12-03 MED ORDER — DEXTROSE 50 % IV SOLN: 25.0000 mL | INTRAVENOUS | Status: DC | PRN

## 2014-12-03 MED ORDER — FINASTERIDE 5 MG PO TABS: 5.0000 mg | ORAL_TABLET | Freq: Every day | ORAL | Status: DC

## 2014-12-03 MED ORDER — HYDROCORTISONE NA SUCCINATE PF 100 MG IJ SOLR
50.0000 mg | Freq: Four times a day (QID) | INTRAMUSCULAR | Status: DC
  Administered 2014-12-03 (×3): 50 mg via INTRAVENOUS
  Filled 2014-12-03 (×3): qty 2

## 2014-12-03 MED ORDER — LEVETIRACETAM IN NACL 1500 MG/100ML IV SOLN
1500.0000 mg | Freq: Two times a day (BID) | INTRAVENOUS | Status: DC
  Administered 2014-12-03 (×3): 1500 mg via INTRAVENOUS
  Filled 2014-12-03 (×5): qty 100

## 2014-12-03 MED ORDER — SODIUM CHLORIDE 0.9 % IJ SOLN: 3.0000 mL | INTRAMUSCULAR | Status: DC | PRN

## 2014-12-03 MED ORDER — SODIUM CHLORIDE 0.9 % IV BOLUS (SEPSIS)
1000.0000 mL | Freq: Once | INTRAVENOUS | Status: AC
  Administered 2014-12-03: 1000 mL via INTRAVENOUS

## 2014-12-03 MED ORDER — ACETAMINOPHEN 650 MG RE SUPP
650.0000 mg | Freq: Four times a day (QID) | RECTAL | Status: DC | PRN
Start: 2014-12-03 — End: 2014-12-06

## 2014-12-03 MED ORDER — INSULIN ASPART 100 UNIT/ML ~~LOC~~ SOLN
0.0000 [IU] | SUBCUTANEOUS | Status: DC
  Administered 2014-12-03: 2 [IU] via SUBCUTANEOUS

## 2014-12-03 MED ORDER — ALUM & MAG HYDROXIDE-SIMETH 200-200-20 MG/5ML PO SUSP: 30.0000 mL | Freq: Four times a day (QID) | ORAL | Status: DC | PRN

## 2014-12-03 NOTE — Progress Notes (Signed)
eLink Physician-Brief Progress Note Patient Name: [name] [name] DOB: August 30, 1946 MRN: [id number]   Date of Service  12/03/2014  HPI/Events of Note  Hypotension on amio gtt at 77/58 (62).  Patient is making urine  eICU Interventions  Plan: 500 cc NS bolus RT to insert aline for HD monitoring     Intervention Category Intermediate Interventions: Hypotension - evaluation and management  DETERDING,ELIZABETH 12/03/2014, 1:44 AM

## 2014-12-03 NOTE — Progress Notes (Signed)
eLink Physician-Brief Progress Note Patient Name: [name] [name] DOB: March 07, 1946 MRN: [id number]   Date of Service  12/03/2014  HPI/Events of Note  Ongoing hypotension verified by aline.  Current pressure of 71/52(58)  eICU Interventions  Plan: 1 liter of NS IV for BP support     Intervention Category Intermediate Interventions: Hypotension - evaluation and management  [name] 12/03/2014, 3:11 AM

## 2014-12-03 NOTE — Consult Note (Signed)
Patient [name] [name]      DOB: Mar 02, 1946      FOY:[id number]     Consult Note from the Palliative Medicine Team at [facility] Requested by: Dr Arnoldo Morale     PCP: [name] Palau, MD Reason for Consultation: [place]     Phone Number:[phone]  Assessment/Recommendations: 69 yo male with metastatic esophageal CA and recent brain abscess who presented obtunded, afib w/rvr, hypoxic resp failure, septic shock requiring pressors  1.  Code Status: DNR  2. GOC: Spoke today with granddaughter [name] [name] and her husband as well as another granddaughter who is his 54 (documnented 09/2014). I asked what they had heard from other Dr's so far, and she tells me he is dying.  From my visit today with agonal breathing, unresponsive, pressor requirement I am in agreement with that assessment.  He has not showed significant signs of improvement with interventions here.  We talked about what comfort care would look like (taking O2 to nasal cannula, weaningoff pressors, only meds for comfort) and they expressed understanding of this. They know that these medications are buying him more time but unlikely to help him survive this event.  I and they expect in hospital death. They would like to talk some more with family but at this point it seems they largely feel that we are only prolonging his dying and that is not what he would want.  I sense they are leaning towards conversion to comfort measures only and will let us know when they make that decision.   3. Symptom Management:   1. Pain/Dyspnea/Agitation- If family elects to pursue comfort care, I would recommend weaning off pressors, and stopping all medications with exception of keppra/vimpat because of his sz precautions.  Would remove NRB and change to O2 via [place] and wean to 2L.  Would start morphine drip at 2mg /hr with 2mg  bolus q53min PRN.  WOuld not adjust continuous infusion rate for acute symptoms (this should be handled by bolus dosing).  If frequent bolus dosing needed then would increase drip (would not do so more than every 6 hours).    4. Psychosocial/Spiritual: Born in [place] and lived in [place] for majority of his life. Former [employer] operational officer.  Lives with his granddaughter her.     Brief HPI: 69 yo male with PMHx of metastatic esophageal CA (brain/bone) s/p resection/XRT/chemo, recent brain abscess s/p surgical resection in Jan 2016. He was admitted yesterday with progressive decline over past 1-2 weeks and found to have lkethargy/ams on admission. Also hypotensive, elevated lactic acid, hypoxic resp failure with pulm edema on CXR.  Obtunded and not responded well with pressors, O2 support, IV abx, amio gtt for afib w/rvr. Remains unresponsive and unable to provide any history.       PMH:  Past Medical History  Diagnosis Date  . GERD (gastroesophageal reflux disease)   . Hyperlipemia   . Barrett's esophagus 12/2013  . History of radiation therapy 02/23/14- 04/04/14    esophagus 5040 cGy 28 sessions  . 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 Hypertension   . 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 Eus N/A 02/03/2014    Procedure: UPPER ENDOSCOPIC ULTRASOUND (EUS) LINEAR;  Surgeon: Milus Banister, MD;  Location: [facility];  Service: Endoscopy;  Laterality: N/A;  . Craniotomy N/A 10/06/2014    Procedure: CRANIOTOMY TUMOR EXCISION WITH CURVE;  Surgeon: Cooper Render  Pool, MD;  Location: [facility];  Service: Neurosurgery;  Laterality: N/A;  CRANIOTOMY TUMOR EXCISION WITH CURVE   I have reviewed the [place] and [facility] and  If appropriate update it with new information. No Known Allergies Scheduled Meds: . albumin human  50 g Intravenous 3 times per day  . dexamethasone  4 mg Intravenous 4 times per day  . finasteride  5 mg Oral Daily  . hydrocortisone sod succinate (SOLU-CORTEF) inj  50 mg Intravenous 4 times per day  . insulin regular  0-10 Units Intravenous TID WC  .  lacosamide (VIMPAT) IV  100 mg Intravenous Q12H  . levETIRAcetam  1,500 mg Intravenous BID  . [START ON 12/04/2014] pantoprazole (PROTONIX) IV  40 mg Intravenous Q24H  . piperacillin-tazobactam (ZOSYN)  IV  3.375 g Intravenous Q8H  . potassium chloride  10 mEq Intravenous Q1 Hr x 4  . sodium chloride  3 mL Intravenous Q12H  . vancomycin  1,000 mg Intravenous Q12H   Continuous Infusions: . sodium chloride    . amiodarone 30 mg/hr (12/03/14 0741)  . dextrose 5 % and 0.45% NaCl    . insulin (NOVOLIN-R) infusion 5.5 Units/hr (12/03/14 0932)  . phenylephrine (NEO-SYNEPHRINE) Adult infusion 75 mcg/min (12/03/14 0835)   PRN Meds:.sodium chloride, acetaminophen **OR** acetaminophen, alum & mag hydroxide-simeth, dextrose, HYDROmorphone (DILAUDID) injection, ondansetron **OR** ondansetron (ZOFRAN) IV, sodium chloride    BP 104/72 mmHg  Pulse 128  Temp(Src) 101.7 F (38.7 C) (Core (Comment))  Resp 29  Ht 5\' 4"  (1.626 m)  Wt 81.647 kg (180 lb)  BMI 30.88 kg/m2  SpO2 100%   PPS:10   Intake/Output Summary (Last 24 hours) at 12/03/14 1000 Last data filed at 12/03/14 0800  Gross per 24 hour  Intake    310 ml  Output    222 ml  Net     88 ml    Physical Exam:  General: nonresponsive, agonal breathing HEENT:  [place], PERRL, sclera anicteric Chest:   Decreased breath sounds CVS: tachy Abdomen:soft, ND Ext: anasarca Neuro: not able to have any purposeful interaction  Labs: CBC    Component Value Date/Time   WBC 5.7 12/03/2014 0700   WBC 1.6* 09/09/2014 1021   RBC 3.05* 12/03/2014 0700   RBC 3.26* 09/09/2014 1021   HGB 10.4* 12/03/2014 0700   HGB 9.9* 09/09/2014 1021   HCT 31.9* 12/03/2014 0700   HCT 31.0* 09/09/2014 1021   PLT 44* 12/03/2014 0700   PLT 236 09/09/2014 1021   MCV 104.6* 12/03/2014 0700   MCV 95.2 09/09/2014 1021   MCH 34.1* 12/03/2014 0700   MCH 30.4 09/09/2014 1021   MCHC 32.6 12/03/2014 0700   MCHC 31.9* 09/09/2014 1021   RDW 17.1* 12/03/2014 0700   RDW  19.6* 09/09/2014 1021   LYMPHSABS 0.3* 12/02/2014 2006   LYMPHSABS 0.4* 09/09/2014 1021   MONOABS 0.2 12/02/2014 2006   MONOABS 0.1 09/09/2014 1021   EOSABS 0.0 12/02/2014 2006   EOSABS 0.0 09/09/2014 1021   BASOSABS 0.0 12/02/2014 2006   BASOSABS 0.0 09/09/2014 1021    BMET    Component Value Date/Time   NA 146* 12/03/2014 0700   NA 137 09/09/2014 1020   K 3.5 12/03/2014 0700   K 3.9 09/09/2014 1020   CL 119* 12/03/2014 0700   CO2 19 12/03/2014 0700   CO2 27 09/09/2014 1020   GLUCOSE 279* 12/03/2014 0700   GLUCOSE 113 09/09/2014 1020   BUN 40* 12/03/2014 0700   BUN 32.1* 09/09/2014 1020  CREATININE 0.86 12/03/2014 0700   CREATININE 1.0 09/09/2014 1020   CALCIUM 7.6* 12/03/2014 0700   CALCIUM 8.3* 09/09/2014 1020   GFRNONAA 86* 12/03/2014 0700   GFRAA >90 12/03/2014 0700    CMP     Component Value Date/Time   NA 146* 12/03/2014 0700   NA 137 09/09/2014 1020   K 3.5 12/03/2014 0700   K 3.9 09/09/2014 1020   CL 119* 12/03/2014 0700   CO2 19 12/03/2014 0700   CO2 27 09/09/2014 1020   GLUCOSE 279* 12/03/2014 0700   GLUCOSE 113 09/09/2014 1020   BUN 40* 12/03/2014 0700   BUN 32.1* 09/09/2014 1020   CREATININE 0.86 12/03/2014 0700   CREATININE 1.0 09/09/2014 1020   CALCIUM 7.6* 12/03/2014 0700   CALCIUM 8.3* 09/09/2014 1020   PROT 4.6* 12/02/2014 2006   PROT 5.4* 09/09/2014 1020   ALBUMIN 2.0* 12/02/2014 2006   ALBUMIN 2.6* 09/09/2014 1020   AST 35 12/02/2014 2006   AST 37* 09/09/2014 1020   ALT 89* 12/02/2014 2006   ALT 35 09/09/2014 1020   ALKPHOS 354* 12/02/2014 2006   ALKPHOS 65 09/09/2014 1020   BILITOT 0.8 12/02/2014 2006   BILITOT 0.50 09/09/2014 1020   GFRNONAA 86* 12/03/2014 0700   GFRAA >90 12/03/2014 0700   3/4 CT Head IMPRESSION: No acute intracranial abnormality.  Mild chronic small vessel disease. Prior left parietal craniotomy and mild encephalomalacia.  3/5 CXR IMPRESSION: Increasing lung opacification compatible with  edema.    Total Time: 50 minutes Greater than 50%  of this time was spent counseling and coordinating care related to the above assessment and plan.  Doran Clay D.O. Palliative Medicine Team at [facility]  Pager: [phone] Team Phone: [phone]

## 2014-12-03 NOTE — Progress Notes (Signed)
Per amion, Palliative consult phone number [phone]

## 2014-12-03 NOTE — Progress Notes (Signed)
[facility] PROGRESS NOTE  Librado Guandique MWU:[id number] DOB: 01/02/1946 DOA: 12/02/2014 PCP: [name], MD  Assessment/Plan: 1. Septic shock -Present on admission, evidenced by a temperature of 91.3, heart rate of 130, respiratory 38, lactic acid of 3.21, hypotension requiring IV pressor support with neomycin -Clinic source of infection could be urinary tract infection given the presence of nitrates as well as healthcare associated pneumonia from diffuse opacities seen on chest x-ray. -Continue supportive care, IV fluid resuscitation, IV pressors, broad-spectrum IV antimicrobial therapy, pulmonary critical care medicine following. -Follow-up on urine and blood cultures  2.  Possible healthcare associated pneumonia -Patient presenting in septic shock, chest x-ray showing diffuse opacities bilaterally. -Continue IV vancomycin and Zosyn -Repeat chest x-ray in a.m.  3.  Atrial fibrillation with rapid ventricular response -Likely secondary to sepsis, hypovolemia, pneumonia -Was started on amiodarone drip  4.  Hyperglycemia -Likely related to inflammation/infectious process -He remains on IV insulin  5.  Urinary tract infection -As mentioned above patient having the presence of nitrates and UA -Continue IV Zosyn -Follow-up on cultures  6.  Hypothermia -Likely secondary to sepsis, presented with temperature of 91.3 -Warming blanket was placed.  7.  Goals of care -Discussed goals of care with patient's granddaughters and niece, I discussed patient's multiple medical issues and his overall poor prognosis. Family members in agreement with a DO NOT RESUSCITATE CODE STATUS. For now they will I to continue with current medical management. They are also agreeable to meet with palliative care.  Code Status: DO NOT RESUSCITATE Family Communication: Spoke with patient's granddaughters and niece Disposition Plan: Continue current medical management in the intensive care  unit   Consultants:  Pulmonary critical care medicine  Palliative care   Antibiotics:  Vancomycin  Zosyn  HPI/Subjective: Patient is an unfortunate 69 year old gentleman with a past medical history metastatic esophageal cancer, status post resection, chemotherapy radiation therapy, admitted to the medicine service on 12/03/2014 presenting with failure to thrive over the past 2 weeks with a steep functional decline in the last 24-48 hours. Patient found to be minimally responsive, septic, having temperature of 91.5, blood pressure of 82/62, heart rates in the 120, started on IV pressors and admitted to the intensive care unit. Initial workup included a chest x-ray showing diffuse opacities that could be concerning for edema per radiology. Also had a urinalysis that showed the presence of nitrates. CT scan of brain was negative. Patient was started on broad-spectrum IV antibiotic therapy with vancomycin and Zosyn. Pulmonary critical care medicine following. During my evaluation patient is unresponsive and remains on pressor support. Goals of care meeting held with patient's granddaughters and niece, confirmed DO NOT RESUSCITATE CODE STATUS. They expressed wishes to continue current medical management. Palliative care consulted.  Objective: Filed Vitals:   12/03/14 0900  BP: 104/72  Pulse: 128  Temp: 101.7 F (38.7 C)  Resp: 29    Intake/Output Summary (Last 24 hours) at 12/03/14 1031 Last data filed at 12/03/14 0800  Gross per 24 hour  Intake    310 ml  Output    222 ml  Net     88 ml   Filed Weights   12/02/14 2059  Weight: 81.647 kg (180 lb)    Exam:   General:  Critically ill appearing, unresponsive, toxic, on multiple drips  Cardiovascular: Tachycardic, regular rate and rhythm normal S1-S2  Respiratory: Coarse respiratory sounds, positive by lateral crackles, rhonchi, rales  Abdomen: Appeared to be soft, could not elicit tenderness to palpation  Musculoskeletal:  Bilateral muscle atrophy, no edema  Data Reviewed: Basic Metabolic Panel:  Recent Labs Lab 12/02/14 2006 12/03/14 0209 12/03/14 0700  NA 145 144 146*  K 4.1 3.5 3.5  CL 111 118* 119*  CO2 25 17* 19  GLUCOSE 609* 424* 279*  BUN 48* 40* 40*  CREATININE 0.90 0.82 0.86  CALCIUM 8.4 7.0* 7.6*  MG  --   --  1.5   Liver Function Tests:  Recent Labs Lab 12/02/14 2006  AST 35  ALT 89*  ALKPHOS 354*  BILITOT 0.8  PROT 4.6*  ALBUMIN 2.0*   No results for input(s): LIPASE, AMYLASE in the last 168 hours.  Recent Labs Lab 12/02/14 2011  AMMONIA 50*   CBC:  Recent Labs Lab 12/02/14 2006 12/03/14 0209 12/03/14 0700  WBC 5.0 4.2 5.7  NEUTROABS 4.5  --   --   HGB 13.7 11.4* 10.4*  HCT 42.5 35.2* 31.9*  MCV 104.4* 103.8* 104.6*  PLT 48* 41* 44*   Cardiac Enzymes:  Recent Labs Lab 12/03/14 0209 12/03/14 0615 12/03/14 0700  TROPONINI 0.03 0.05* 0.06*   BNP (last 3 results) No results for input(s): BNP in the last 8760 hours.  ProBNP (last 3 results)  Recent Labs  09/15/14 1320 09/17/14 0455  PROBNP 2151.0* 642.5*    CBG:  Recent Labs Lab 12/03/14 0311 12/03/14 0414 12/03/14 0518 12/03/14 0620 12/03/14 0716  GLUCAP 233* 330* 297* 257* 248*    Recent Results (from the past 240 hour(s))  MRSA PCR Screening     Status: None   Collection Time: 12/03/14  1:21 AM  Result Value Ref Range Status   MRSA by PCR NEGATIVE NEGATIVE Final    Comment:        The GeneXpert MRSA Assay (FDA approved for NASAL specimens only), is one component of a comprehensive MRSA colonization surveillance program. It is not intended to diagnose MRSA infection nor to guide or monitor treatment for MRSA infections.      Studies: Dg Chest 1 View  12/02/2014   CLINICAL DATA:  Altered mental status, recent pneumonia  EXAM: CHEST  1 VIEW  COMPARISON:  09/28/2014  FINDINGS: There is a right IJ porta catheter with tip at the upper right atrium.  Mild cardiomegaly which is  chronic. Stable and normal heart size and mediastinal contours. There are chronic reticular opacities in the lower lungs with pleural thickening and probable fissural fluid on the right. No definite superimposed pneumonia or edema.  IMPRESSION: Chronic lung disease without definite superimposed pneumonia.   Electronically Signed   By: Monte Fantasia M.D.   On: 12/02/2014 21:10   Ct Head Wo Contrast  12/02/2014   CLINICAL DATA:  Altered mental status. Metastatic pancreatic and esophageal carcinomas. Previous resection of brain metastasis.  EXAM: CT HEAD WITHOUT CONTRAST  TECHNIQUE: Contiguous axial images were obtained from the base of the skull through the vertex without intravenous contrast.  COMPARISON:  Brain MRI on 10/07/2014  FINDINGS: There is no evidence of intracranial hemorrhage, brain edema, or other signs of acute infarction. There is no evidence of intracranial mass lesion or mass effect. No abnormal extraaxial fluid collections are identified.  Mild encephalomalacia is seen in the left parietal lobe at site of prior craniotomy. Ventricles stable in size. Mild chronic small vessel disease again noted.  IMPRESSION: No acute intracranial abnormality.  Mild chronic small vessel disease. Prior left parietal craniotomy and mild encephalomalacia.   Electronically Signed   By: Earle Gell M.D.   On:  12/02/2014 21:29   Dg Chest Port 1 View  12/03/2014   CLINICAL DATA:  Rales  EXAM: PORTABLE CHEST - 1 VIEW  COMPARISON:  12/02/2014  FINDINGS: New diffuse airspace and interstitial opacity. Small bilateral pleural effusions.  Stable heart size and aortic contours.  Right IJ porta catheter, tip at the upper cavoatrial junction.  No pneumothorax.  IVC filter noted, partially visualized.  IMPRESSION: Increasing lung opacification compatible with edema.   Electronically Signed   By: Monte Fantasia M.D.   On: 12/03/2014 00:28    Scheduled Meds: . albumin human  50 g Intravenous 3 times per day  . dexamethasone   4 mg Intravenous 4 times per day  . finasteride  5 mg Oral Daily  . hydrocortisone sod succinate (SOLU-CORTEF) inj  50 mg Intravenous 4 times per day  . insulin regular  0-10 Units Intravenous TID WC  . lacosamide (VIMPAT) IV  100 mg Intravenous Q12H  . levETIRAcetam  1,500 mg Intravenous BID  . [START ON 12/04/2014] pantoprazole (PROTONIX) IV  40 mg Intravenous Q24H  . piperacillin-tazobactam (ZOSYN)  IV  3.375 g Intravenous Q8H  . sodium chloride  3 mL Intravenous Q12H  . vancomycin  1,000 mg Intravenous Q12H   Continuous Infusions: . sodium chloride    . amiodarone 30 mg/hr (12/03/14 0741)  . dextrose 5 % and 0.45% NaCl 1,000 mL (12/03/14 1021)  . insulin (NOVOLIN-R) infusion 3.8 Units/hr (12/03/14 1021)  . phenylephrine (NEO-SYNEPHRINE) Adult infusion 75 mcg/min (12/03/14 0835)    Principal Problem:   Sepsis Active Problems:   Dysphagia   Esophageal carcinoma   Atrial fibrillation   Severe protein-calorie malnutrition   Hyperglycemia   Hypotension   UTI (lower urinary tract infection)   Acute encephalopathy   Anasarca   Hypothermia    Time spent: 40 minutes    Kelvin Cellar  [facility] Pager [phone]. If 7PM-7AM, please contact night-coverage at [email or web address], password [facility] 12/03/2014, 10:31 AM  LOS: 1 day

## 2014-12-03 NOTE — H&P (Signed)
[facility] Admission History and Physical       [name] KMM:[id number] DOB: 24-Mar-1946 DOA: 12/02/2014  Referring physician: EDP PCP: [name], MD  Specialists:   Chief Complaint: Confusion  HPI: [name] [name] is a 69 y.o. male with Metastatic Esophageal Cancer S/P Resection, Chemotherapy and Radiation Rx, S/P Craniotomy ( 09/2014) for Left Frontal  Brain Abscess HTN, Hyperlipidemia, and DVT of LLE S/P IVC filter who was brought to the ED due to increased Confusion, and continued decline over the past 1.5 weeks.  The history is peer his grand-daughter who is his primary caregiver and she reports that he has had a poor intake of foods and liquids.  In the ED, he was found to have hypothermia and a glucose level of 609.   His lactic acid level was elevated and he was placed on the Sepsis protocol, and his UA was positive.    He was referred for medical admission.     Review of Systems: Unable to Obtain from the Patient  Past Medical History  Diagnosis Date  . GERD (gastroesophageal reflux disease)   . Hyperlipemia   . Barrett's esophagus 12/2013  . History of radiation therapy 02/23/14- 04/04/14    esophagus 5040 cGy 28 sessions  . 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 Hypertension   . 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 Eus N/A 02/03/2014    Procedure: UPPER ENDOSCOPIC ULTRASOUND (EUS) LINEAR;  Surgeon: Milus Banister, MD;  Location: [facility];  Service: Endoscopy;  Laterality: N/A;  . Craniotomy N/A 10/06/2014    Procedure: CRANIOTOMY TUMOR EXCISION WITH CURVE;  Surgeon: [name], MD;  Location: [facility];  Service: Neurosurgery;  Laterality: N/A;  CRANIOTOMY TUMOR EXCISION WITH CURVE      Prior to Admission medications   Medication Sig Start Date End Date Taking? Authorizing Provider  acetaminophen (TYLENOL) 325 MG tablet Take 2 tablets (650 mg total) by mouth every 4 (four)  hours as needed for mild pain (temp > 100.5). 10/11/14  Yes Estela Leonie Green, MD  albuterol (PROVENTIL HFA;VENTOLIN HFA) 108 (90 BASE) MCG/ACT inhaler Inhale into the lungs every 6 (six) hours as needed for wheezing or shortness of breath.   Yes Historical Provider, MD  dexamethasone (DECADRON) 4 MG tablet Take 1 tablet (4 mg total) by mouth every 6 (six) hours. 10/11/14  Yes Erline Hau, MD  finasteride (PROSCAR) 5 MG tablet Take 5 mg by mouth daily.   Yes Historical Provider, MD  lacosamide 100 MG TABS Take 1 tablet (100 mg total) by mouth 2 (two) times daily. 10/11/14  Yes Estela Leonie Green, MD  levETIRAcetam (KEPPRA) 750 MG tablet Take 2 tablets (1,500 mg total) by mouth 2 (two) times daily. 10/11/14  Yes Erline Hau, MD  levofloxacin (LEVAQUIN) 750 MG tablet Take 750 mg by mouth daily. 7 day regimen 11/17/14-11/23/14   Yes Historical Provider, MD  metroNIDAZOLE (FLAGYL) 500 MG tablet Take 1 tablet (500 mg total) by mouth every 6 (six) hours. 10/11/14 12/05/14 Yes Estela Leonie Green, MD  mirtazapine (REMERON) 7.5 MG tablet Take 7.5 mg by mouth at bedtime.   Yes Historical Provider, MD  omeprazole (PRILOSEC) 20 MG capsule Take 20 mg by mouth daily.   Yes Historical Provider, MD  oxyCODONE (OXY IR/ROXICODONE) 5 MG immediate release tablet Take 1 tablet (5 mg total) by mouth every  4 (four) hours as needed for moderate pain. 10/11/14  Yes Erline Hau, MD  tiotropium (SPIRIVA) 18 MCG inhalation capsule Place 1 capsule (18 mcg total) into inhaler and inhale daily. 08/18/14  Yes Brand Males, MD  traMADol (ULTRAM) 50 MG tablet Take 50 mg by mouth every 6 (six) hours as needed for moderate pain or severe pain.   Yes Historical Provider, MD  cefTRIAXone (ROCEPHIN) 40 MG/ML IVPB Inject 50 mLs (2 g total) into the vein every 12 (twelve) hours. Patient not taking: Reported on 11/18/2014 10/11/14 12/05/14  Erline Hau, MD  pantoprazole  (PROTONIX) 40 MG tablet Take 1 tablet (40 mg total) by mouth daily at 12 noon. Patient not taking: Reported on 12/02/2014 10/11/14   Erline Hau, MD  warfarin (COUMADIN) 2 MG tablet Take 2 mg by mouth daily. For positive DVT right leg    Historical Provider, MD     No Known Allergies  Social History:  reports that he quit smoking about 26 years ago. His smoking use included Cigarettes. He has a 70 pack-year smoking history. He has never used smokeless tobacco. He reports that he drinks alcohol. He reports that he does not use illicit drugs.    Family History  Problem Relation Age of Onset  . Breast cancer Sister        Physical Exam:  GEN:  Toxic Appearing Elderly 69 y.o. African American male with Anasarca examined and in acute distress; Filed Vitals:   12/02/14 2300 12/02/14 2315 12/02/14 2330 12/02/14 2345  BP: 96/72 103/78 106/69 102/75  Pulse: 37 38 131 136  Temp:   93.5 F (34.2 C) 93.7 F (34.3 C)  TempSrc:   Rectal Rectal  Resp: 49 36 39 45  Height:      Weight:      SpO2: 96% 92% 93% 95%   Blood pressure 102/75, pulse 136, temperature 93.7 F (34.3 C), temperature source Rectal, resp. rate 45, height 5\' 4"  (1.626 m), weight 81.647 kg (180 lb), SpO2 95 %. PSYCH: He is alert and oriented x 2;  HEENT: Normocephalic and Atraumatic, Mucous membranes pink; PERRLA; EOM intact; Fundi:  Benign;  No scleral icterus, Nares: Patent, Oropharynx: Clear, Edentulous,    Neck:  FROM, No Cervical Lymphadenopathy nor Thyromegaly or Carotid Bruit; No JVD; Breasts:: Not examined CHEST WALL: No tenderness CHEST: Normal respiration, clear to auscultation bilaterally HEART: Regular rate and rhythm; no murmurs rubs or gallops BACK: No kyphosis or scoliosis; No CVA tenderness ABDOMEN: Positive Bowel Sounds, Scaphoid, Soft Non-Tender, No Rebound or Guarding; No Masses, No Organomegaly Rectal Exam: Not done EXTREMITIES: No Cyanosis, Clubbing, 3-4+ Edema; No  Ulcerations. Genitalia: not examined PULSES: 2+ and symmetric SKIN: Normal hydration no rash or ulceration CNS:  Alert and Oriented x 2, Generalized Weakness, Bedbound Vascular: pulses palpable throughout    Labs on Admission:  Basic Metabolic Panel:  Recent Labs Lab 12/02/14 2006  NA 145  K 4.1  CL 111  CO2 25  GLUCOSE 609*  BUN 48*  CREATININE 0.90  CALCIUM 8.4   Liver Function Tests:  Recent Labs Lab 12/02/14 2006  AST 35  ALT 89*  ALKPHOS 354*  BILITOT 0.8  PROT 4.6*  ALBUMIN 2.0*   No results for input(s): LIPASE, AMYLASE in the last 168 hours.  Recent Labs Lab 12/02/14 2011  AMMONIA 50*   CBC:  Recent Labs Lab 12/02/14 2006  WBC 5.0  NEUTROABS 4.5  HGB 13.7  HCT 42.5  MCV  104.4*  PLT 48*   Cardiac Enzymes: No results for input(s): CKTOTAL, CKMB, CKMBINDEX, TROPONINI in the last 168 hours.  BNP (last 3 results) No results for input(s): BNP in the last 8760 hours.  ProBNP (last 3 results)  Recent Labs  09/15/14 1320 09/17/14 0455  PROBNP 2151.0* 642.5*    CBG:  Recent Labs Lab 12/02/14 2204 12/02/14 2248  GLUCAP 439* 426*    Radiological Exams on Admission: Dg Chest 1 View  12/02/2014   CLINICAL DATA:  Altered mental status, recent pneumonia  EXAM: CHEST  1 VIEW  COMPARISON:  09/28/2014  FINDINGS: There is a right IJ porta catheter with tip at the upper right atrium.  Mild cardiomegaly which is chronic. Stable and normal heart size and mediastinal contours. There are chronic reticular opacities in the lower lungs with pleural thickening and probable fissural fluid on the right. No definite superimposed pneumonia or edema.  IMPRESSION: Chronic lung disease without definite superimposed pneumonia.   Electronically Signed   By: Monte Fantasia M.D.   On: 12/02/2014 21:10   Ct Head Wo Contrast  12/02/2014   CLINICAL DATA:  Altered mental status. Metastatic pancreatic and esophageal carcinomas. Previous resection of brain metastasis.   EXAM: CT HEAD WITHOUT CONTRAST  TECHNIQUE: Contiguous axial images were obtained from the base of the skull through the vertex without intravenous contrast.  COMPARISON:  Brain MRI on 10/07/2014  FINDINGS: There is no evidence of intracranial hemorrhage, brain edema, or other signs of acute infarction. There is no evidence of intracranial mass lesion or mass effect. No abnormal extraaxial fluid collections are identified.  Mild encephalomalacia is seen in the left parietal lobe at site of prior craniotomy. Ventricles stable in size. Mild chronic small vessel disease again noted.  IMPRESSION: No acute intracranial abnormality.  Mild chronic small vessel disease. Prior left parietal craniotomy and mild encephalomalacia.   Electronically Signed   By: Earle Gell M.D.   On: 12/02/2014 21:29     EKG: Independently reviewed.    Assessment/Plan:   69 y.o. male with  Principal Problem:   1.   Sepsis-   IV Vancomycin and Zosyn   IVFs   Active Problems:    2.    Hypothermia   Warming Blanket PRN     3.   Hypotension   IVFs     3.   Hyperglycemia   IV Insulin Protocol     4.   UTI (lower urinary tract infection)   Covered by Abxs in #1     5.   Acute encephalopathy- Multifactorial Due to #1, #2, #3, #4, #6.            6.   Esophageal carcinoma   Notify Oncology in AM     7.   Dysphagia- due to #6 and Treatment changes     8.   Atrial fibrillation   Not a Coumadin or heparin candidate     9.   Severe protein-calorie malnutrition- due to Poor Intake    10.  Anasarca- due to #9   Lasix as BP tolerates    11. Thrombocytopenia   Monitor PLTs    12.  DVT Prophylaxis   SCDs   13.  Other- Palliative Care Consult for Goals of Care         Code Status:     FULL CODE        Family Communication:   Grand-Daughter (POA) at Bedside     Disposition Plan:  Inpatient  Observation Status        Time spent:  102 Minutes      [facility] Pager  [phone]   If [place] Please Contact the Day Rounding Team MD for [facility]  If 7PM-7AM, Please Contact Night-Floor Coverage  [email or web address] Password [email or web address] 12/03/2014, 12:02 AM     ADDENDUM:   Patient was seen and examined on 12/03/2014

## 2014-12-03 NOTE — Progress Notes (Signed)
First saw patient in Emergency.  He seemed to respond to verbal inquiries with head nod, and better from those he recognized.  His ability to verbalize seemed minimal.  Present were two of his grand/great nieces, one who stated she did a large portion of his care at home.  There was some question of whether he was full code or DNR.  His grand daughter is medical POA and kept him as full code wen she arrived.  This was disconcerting to the significantly older grand/great nieces.  There was some defensiveness on both parts, but with the course of the evening they pulled together as family.  Chaplain spent time with various family members who had arrived (6 total) of various relation to the patient.  Helped a couple walk through these beginning stages of grief, and made whatever contribution to the amelioration of the initial family differences regarding code status.  Prayed with the initial family members present.  Chaplain informed them of his continued service through the weekend if needed.  Nursing staff was smooth and efficient in handling arrival on the unit, and graceful and compassionate in handling the family differences.  Loann Quill, Chaplain Pager: [phone]

## 2014-12-03 NOTE — Progress Notes (Signed)
Upon assessment of patient nurse noticed patient agonal breathing, tachypnic, minimally responsive, very tense and looking like he is pain, with severe crackles and desaturating on NRB at 15 L. Nurse had a long talk with family member about patients condition, patient's wishes, quality vs quantity. POA wants to make patient full comfort at this time. Patient stated, "He wouldn't want this and I want him to be comfortable." NP Stanton Kidney notified to come talk with POA. Pain medication given to patient.

## 2014-12-03 NOTE — Consult Note (Signed)
PULMONARY / CRITICAL CARE MEDICINE HISTORY AND PHYSICAL EXAMINATION   Name: [name] [name] MRN: [id number] DOB: 1946/04/30    ADMISSION DATE:  12/02/2014  PRIMARY SERVICE: PCCM  CHIEF COMPLAINT:  AMS  BRIEF PATIENT DESCRIPTION: 40 M with metastatic esophageal cancer who presented to [facility] on 3/4 with AMS in setting of hypotension. Patient remained hypotensive despite aggressive IVF and PCCM assumed management.   SIGNIFICANT EVENTS / STUDIES:  Glucose 609 on arrival VBG 7.357/43.5/38  Lactic Acid 2.62 --> 3.21  LINES / TUBES: Port  CULTURES: Blood culture x 2, 3/4 Urine Culture 3/4  ANTIBIOTICS: Vanc 3/4- Zosyn 3/4-  HISTORY OF PRESENT ILLNESS:  [name] [name] is a 37 M with metastatic esophageal cancer, recent brain abscess, HTN, and HL who was brought to [facility] on 3/4 by [name] [name] who is [name] POA for AMS.The patient is currently obtunded and the following was obtained from chart review and discussion with the [name].  Recently, [name] po intake has decreased and he was noted to be more somnolent.   PAST MEDICAL HISTORY :  Past Medical History  Diagnosis Date  . GERD (gastroesophageal reflux disease)   . Hyperlipemia   . Barrett's esophagus 12/2013  . History of radiation therapy 02/23/14- 04/04/14    esophagus 5040 cGy 28 sessions  . 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 Hypertension   . 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 Eus N/A 02/03/2014    Procedure: UPPER ENDOSCOPIC ULTRASOUND (EUS) LINEAR;  Surgeon: Milus Banister, MD;  Location: [facility];  Service: Endoscopy;  Laterality: N/A;  . Craniotomy N/A 10/06/2014    Procedure: CRANIOTOMY TUMOR EXCISION WITH CURVE;  Surgeon: [name], MD;  Location: [facility];  Service: Neurosurgery;  Laterality: N/A;  CRANIOTOMY TUMOR EXCISION WITH CURVE   Prior to Admission medications   Medication Sig Start Date End Date Taking? Authorizing Provider   acetaminophen (TYLENOL) 325 MG tablet Take 2 tablets (650 mg total) by mouth every 4 (four) hours as needed for mild pain (temp > 100.5). 10/11/14  Yes Estela Leonie Green, MD  albuterol (PROVENTIL HFA;VENTOLIN HFA) 108 (90 BASE) MCG/ACT inhaler Inhale into the lungs every 6 (six) hours as needed for wheezing or shortness of breath.   Yes Historical Provider, MD  dexamethasone (DECADRON) 4 MG tablet Take 1 tablet (4 mg total) by mouth every 6 (six) hours. 10/11/14  Yes Erline Hau, MD  finasteride (PROSCAR) 5 MG tablet Take 5 mg by mouth daily.   Yes Historical Provider, MD  lacosamide 100 MG TABS Take 1 tablet (100 mg total) by mouth 2 (two) times daily. 10/11/14  Yes Estela Leonie Green, MD  levETIRAcetam (KEPPRA) 750 MG tablet Take 2 tablets (1,500 mg total) by mouth 2 (two) times daily. 10/11/14  Yes Erline Hau, MD  levofloxacin (LEVAQUIN) 750 MG tablet Take 750 mg by mouth daily. 7 day regimen 11/17/14-11/23/14   Yes Historical Provider, MD  metroNIDAZOLE (FLAGYL) 500 MG tablet Take 1 tablet (500 mg total) by mouth every 6 (six) hours. 10/11/14 12/05/14 Yes Estela Leonie Green, MD  mirtazapine (REMERON) 7.5 MG tablet Take 7.5 mg by mouth at bedtime.   Yes Historical Provider, MD  omeprazole (PRILOSEC) 20 MG capsule Take 20 mg by mouth daily.   Yes Historical Provider, MD  oxyCODONE (OXY IR/ROXICODONE) 5 MG immediate release tablet Take 1 tablet (5 mg total) by  mouth every 4 (four) hours as needed for moderate pain. 10/11/14  Yes Erline Hau, MD  tiotropium (SPIRIVA) 18 MCG inhalation capsule Place 1 capsule (18 mcg total) into inhaler and inhale daily. 08/18/14  Yes Brand Males, MD  traMADol (ULTRAM) 50 MG tablet Take 50 mg by mouth every 6 (six) hours as needed for moderate pain or severe pain.   Yes Historical Provider, MD  cefTRIAXone (ROCEPHIN) 40 MG/ML IVPB Inject 50 mLs (2 g total) into the vein every 12 (twelve) hours. Patient not  taking: Reported on 11/18/2014 10/11/14 12/05/14  Erline Hau, MD  pantoprazole (PROTONIX) 40 MG tablet Take 1 tablet (40 mg total) by mouth daily at 12 noon. Patient not taking: Reported on 12/02/2014 10/11/14   Erline Hau, MD  warfarin (COUMADIN) 2 MG tablet Take 2 mg by mouth daily. For positive DVT right leg    Historical Provider, MD   No Known Allergies  FAMILY HISTORY:  Family History  Problem Relation Age of Onset  . Breast cancer Sister    SOCIAL HISTORY:  reports that he quit smoking about 26 years ago. [name] smoking use included Cigarettes. He has a 70 pack-year smoking history. He has never used smokeless tobacco. He reports that he drinks alcohol. He reports that he does not use illicit drugs.  REVIEW OF SYSTEMS:  Unable to obtain secondary to patient condition.  SUBJECTIVE:   VITAL SIGNS: Temp:  [90.9 F (32.7 C)-94.9 F (34.9 C)] 94.9 F (34.9 C) (03/05 0000) Pulse Rate:  [35-154] 154 (03/05 0000) Resp:  [17-49] 37 (03/05 0000) BP: (82-131)/(57-102) 100/79 mmHg (03/05 0000) SpO2:  [92 %-100 %] 94 % (03/05 0000) Weight:  [180 lb (81.647 kg)] 180 lb (81.647 kg) (03/04 2059) HEMODYNAMICS:   VENTILATOR SETTINGS:   INTAKE / OUTPUT: Intake/Output      03/04 0701 - 03/05 0700   I.V. (mL/kg) 125 (1.5)   IV Piggyback 135   Total Intake(mL/kg) 260 (3.2)   Urine (mL/kg/hr) 125   Total Output 125   Net +135         PHYSICAL EXAMINATION: General:  Frail appearing M  Neuro:  Somnolent HEENT:  Sclera anicteric, conjunctiva pale, MMM Neck: Trachea supple and midline, (-) LAN or JVD Cardiovascular: Tachycardic, Irr Rate, NS1/S2, (-) MRG Lungs:  Coarse BS bilaterally Abdomen:  S/NT/ND/(+)BS Musculoskeletal:  2+ edema to hips Skin:  Stage 4 decub per RN  LABS:  CBC  Recent Labs Lab 12/02/14 2006 12/03/14 0209  WBC 5.0 4.2  HGB 13.7 11.4*  HCT 42.5 35.2*  PLT 48* 41*   Coag's  Recent Labs Lab 12/02/14 2009  INR 1.27    BMET  Recent Labs Lab 12/02/14 2006 12/03/14 0209  NA 145 144  K 4.1 3.5  CL 111 118*  CO2 25 17*  BUN 48* 40*  CREATININE 0.90 0.82  GLUCOSE 609* 424*   Electrolytes  Recent Labs Lab 12/02/14 2006 12/03/14 0209  CALCIUM 8.4 7.0*   Sepsis Markers  Recent Labs Lab 12/02/14 2021 12/02/14 2329  LATICACIDVEN 2.62* 3.21*   ABG No results for input(s): PHART, PCO2ART, PO2ART in the last 168 hours. Liver Enzymes  Recent Labs Lab 12/02/14 2006  AST 35  ALT 89*  ALKPHOS 354*  BILITOT 0.8  ALBUMIN 2.0*   Cardiac Enzymes  Recent Labs Lab 12/03/14 0209  TROPONINI 0.03   Glucose  Recent Labs Lab 12/02/14 2204 12/02/14 2248 12/03/14 0118 12/03/14 0203 12/03/14 0311  GLUCAP 439*  426* 361* 365* 233*    Imaging Dg Chest 1 View  12/02/2014   CLINICAL DATA:  Altered mental status, recent pneumonia  EXAM: CHEST  1 VIEW  COMPARISON:  09/28/2014  FINDINGS: There is a right IJ porta catheter with tip at the upper right atrium.  Mild cardiomegaly which is chronic. Stable and normal heart size and mediastinal contours. There are chronic reticular opacities in the lower lungs with pleural thickening and probable fissural fluid on the right. No definite superimposed pneumonia or edema.  IMPRESSION: Chronic lung disease without definite superimposed pneumonia.   Electronically Signed   By: Monte Fantasia M.D.   On: 12/02/2014 21:10   Ct Head Wo Contrast  12/02/2014   CLINICAL DATA:  Altered mental status. Metastatic pancreatic and esophageal carcinomas. Previous resection of brain metastasis.  EXAM: CT HEAD WITHOUT CONTRAST  TECHNIQUE: Contiguous axial images were obtained from the base of the skull through the vertex without intravenous contrast.  COMPARISON:  Brain MRI on 10/07/2014  FINDINGS: There is no evidence of intracranial hemorrhage, brain edema, or other signs of acute infarction. There is no evidence of intracranial mass lesion or mass effect. No abnormal  extraaxial fluid collections are identified.  Mild encephalomalacia is seen in the left parietal lobe at site of prior craniotomy. Ventricles stable in size. Mild chronic small vessel disease again noted.  IMPRESSION: No acute intracranial abnormality.  Mild chronic small vessel disease. Prior left parietal craniotomy and mild encephalomalacia.   Electronically Signed   By: Earle Gell M.D.   On: 12/02/2014 21:29   Dg Chest Port 1 View  12/03/2014   CLINICAL DATA:  Rales  EXAM: PORTABLE CHEST - 1 VIEW  COMPARISON:  12/02/2014  FINDINGS: New diffuse airspace and interstitial opacity. Small bilateral pleural effusions.  Stable heart size and aortic contours.  Right IJ porta catheter, tip at the upper cavoatrial junction.  No pneumothorax.  IVC filter noted, partially visualized.  IMPRESSION: Increasing lung opacification compatible with edema.   Electronically Signed   By: Monte Fantasia M.D.   On: 12/03/2014 00:28    CXR: Reviewed, diffuse infiltrate.  ASSESSMENT / PLAN:  Principal Problem:   Sepsis Active Problems:   Dysphagia   Esophageal carcinoma   Atrial fibrillation   Severe protein-calorie malnutrition   Hyperglycemia   Hypotension   UTI (lower urinary tract infection)   Acute encephalopathy   Anasarca   Hypothermia   PULMONARY A: Acute hypoxic respiratory failure: ? If 2/2 aspiration/pneumonia +/- hypoventilation. Per [name] now DNI.  P:   Supplemental O2 to maintain sats >=92%  CARDIOVASCULAR A: Septic Shock: Sources most likely urinary vs. Pneumonia. S/p aggressive IVF. Afib with RVR: HTN HL P:   Supplemental albumin Stress dose steroids Serial Trops/Lactates Neo to maintain MAP >= 65 Amio load in progress Hold OP meds  RENAL A: Hypokalemia: P:   Replete K  GASTROINTESTINAL A: Metastatic Esophageal Cancer:  P:     HEMATOLOGIC A: Anemia: Most likely dilutional. Thrombocytopenia: Likely 2/2 acute illness  P:   Monitor  CBC T+S  INFECTIOUS A: HCAP/Urosepsis:  P:   Empiric vanc/zosyn  ENDOCRINE A: Hyperglycemia:  P:   Insulin Drip  NEUROLOGIC A: AMS: Likely 2/2 acute infection. P:   Monitor  BEST PRACTICE / DISPOSITION Level of Care:  ICU Primary Service:  PCCM Consultants:  None Code Status:  DNR/DNI. Will not escalate therapy from Abx, fluids, supplemental O2. Diet:  NPO DVT Px:  Holding SQH given thrombocytopenia GI Px:  PPI Skin Integrity:  Stage 4 decub reported by RN Social / Family:  [name]/POA updated  TODAY'S SUMMARY:   I have personally obtained a history, examined the patient, evaluated laboratory and imaging results, formulated the assessment and plan and placed orders.  CRITICAL CARE: The patient is critically ill with multiple organ systems failure and requires high complexity decision making for assessment and support, frequent evaluation and titration of therapies, application of advanced monitoring technologies and extensive interpretation of multiple databases. Critical Care Time devoted to patient care services described in this note is 60 minutes.   Margarette Asal, MD Pulmonary and [place] Pager: [phone]   12/03/2014, 4:09 AM

## 2014-12-03 NOTE — Progress Notes (Signed)
eLink Physician-Brief Progress Note Patient Name: [name] [name] DOB: 1946-07-18 MRN: [id number]   Date of Service  12/03/2014  HPI/Events of Note  AF with RVR with hypotension.  Had hypotension prior to AF/RVR.  Has received liters of IVF  eICU Interventions  Plan: Amio load and infusion 500 cc bolus of NS Cycle trop     Intervention Category Intermediate Interventions: Arrhythmia - evaluation and management  DETERDING,ELIZABETH 12/03/2014, 1:06 AM

## 2014-12-03 NOTE — Procedures (Signed)
Arterial Catheter Insertion Procedure Note [name] [name] [id number] 05-29-1946  Procedure: Insertion of Arterial Catheter  Indications: Blood pressure monitoring  Procedure Details Consent: Risks of procedure as well as the alternatives and risks of each were explained to the (patient/caregiver).  Consent for procedure obtained. Time Out: Verified patient identification, verified procedure, site/side was marked, verified correct patient position, special equipment/implants available, medications/allergies/relevent history reviewed, required imaging and test results available.  Performed  Maximum sterile technique was used including antiseptics, cap, gloves, gown, hand hygiene, mask and sheet. Skin prep: Chlorhexidine; local anesthetic administered 20 gauge catheter was inserted into left radial artery using the Seldinger technique.  Evaluation Blood flow good; BP tracing good. Complications: No apparent complications.   Rosann Auerbach 12/03/2014

## 2014-12-03 NOTE — Progress Notes (Signed)
Called to bedside to speak with family about goals of care. Niece and granddaughter at bedside and request that patient be comfort care only. It appears that in spite of aggressive treatment she is worsening. They understand that stopping antiarrythmic and pressors will likely hasten her demise but feel that she is suffering at this time.   Will discontinue antibiotics, amiodarone, neosynephrine and manage patient with medications for comfort only eg. Prn antiemetics, prn pain and anxiety medications. Continue oxygen for comfort only.  Currently patient appears in no distress : vital signs HR 113 bpm, BP 83/48, oxygen saturation 88% on NRB.

## 2014-12-03 NOTE — Progress Notes (Signed)
[facility] called [facility] [phone] and spoke to RN on call Claiborne Billings who reports patient is NOT currently receiving hospice services with [facility], unable to receive order for hospice.   Per Claiborne Billings, patient's grand daughter Cammie Mcgee is listed as patient's POA, no advanced directive on file.  EDCM informed Claiborne Billings that patient is at [place] reports if Janeece Riggers can be of any assistance for services to call [phone].  Claiborne Billings reports Janeece Riggers will reach out to patient tomorrow.  Palliative consult in place.  EDCM discussed above with Dr. Arnoldo Morale.  No further EDCM needs at this time.

## 2014-12-04 DIAGNOSIS — R6521 Severe sepsis with septic shock: Secondary | ICD-10-CM

## 2014-12-04 DIAGNOSIS — K117 Disturbances of salivary secretion: Secondary | ICD-10-CM

## 2014-12-04 LAB — GLUCOSE, CAPILLARY
GLUCOSE-CAPILLARY: 108 mg/dL — AB (ref 70–99)
Glucose-Capillary: 202 mg/dL — ABNORMAL HIGH (ref 70–99)
Glucose-Capillary: 135 mg/dL — ABNORMAL HIGH (ref 70–99)
Glucose-Capillary: 151 mg/dL — ABNORMAL HIGH (ref 70–99)
Glucose-Capillary: 129 mg/dL — ABNORMAL HIGH (ref 70–99)

## 2014-12-04 LAB — URINE CULTURE
CULTURE: NO GROWTH
Colony Count: NO GROWTH

## 2014-12-04 MED ORDER — SCOPOLAMINE 1 MG/3DAYS TD PT72
1.0000 | MEDICATED_PATCH | TRANSDERMAL | Status: DC
Start: 2014-12-04 — End: 2014-12-06
  Administered 2014-12-04: 1.5 mg via TRANSDERMAL
  Filled 2014-12-04: qty 1

## 2014-12-04 NOTE — Progress Notes (Signed)
Patient [name] [name]      DOB: 1945/11/15      PJA:[id number]   Palliative Medicine Team at [facility] Progress Note    Subjective: Resting comfortably, little more awake.  Denies any pain or dyspnea   Filed Vitals:   12/04/14 0600  BP:   Pulse: 73  Temp: 94.5 F (34.7 C)  Resp: 8   Physical exam: GEN: lethargic but arouses, NAD HEENT: [place], sclera anicteric, mmm CV: RRR LUNGS: decreased breath sounds, symm exp ABD: soft, ND EXT: warm  CBC    Component Value Date/Time   WBC 5.7 12/03/2014 0700   WBC 1.6* 09/09/2014 1021   RBC 3.05* 12/03/2014 0700   RBC 3.26* 09/09/2014 1021   HGB 10.4* 12/03/2014 0700   HGB 9.9* 09/09/2014 1021   HCT 31.9* 12/03/2014 0700   HCT 31.0* 09/09/2014 1021   PLT 44* 12/03/2014 0700   PLT 236 09/09/2014 1021   MCV 104.6* 12/03/2014 0700   MCV 95.2 09/09/2014 1021   MCH 34.1* 12/03/2014 0700   MCH 30.4 09/09/2014 1021   MCHC 32.6 12/03/2014 0700   MCHC 31.9* 09/09/2014 1021   RDW 17.1* 12/03/2014 0700   RDW 19.6* 09/09/2014 1021   LYMPHSABS 0.3* 12/02/2014 2006   LYMPHSABS 0.4* 09/09/2014 1021   MONOABS 0.2 12/02/2014 2006   MONOABS 0.1 09/09/2014 1021   EOSABS 0.0 12/02/2014 2006   EOSABS 0.0 09/09/2014 1021   BASOSABS 0.0 12/02/2014 2006   BASOSABS 0.0 09/09/2014 1021    CMP     Component Value Date/Time   NA 146* 12/03/2014 0700   NA 137 09/09/2014 1020   K 3.5 12/03/2014 0700   K 3.9 09/09/2014 1020   CL 119* 12/03/2014 0700   CO2 19 12/03/2014 0700   CO2 27 09/09/2014 1020   GLUCOSE 279* 12/03/2014 0700   GLUCOSE 113 09/09/2014 1020   BUN 40* 12/03/2014 0700   BUN 32.1* 09/09/2014 1020   CREATININE 0.86 12/03/2014 0700   CREATININE 1.0 09/09/2014 1020   CALCIUM 7.6* 12/03/2014 0700   CALCIUM 8.3* 09/09/2014 1020   PROT 4.6* 12/02/2014 2006   PROT 5.4* 09/09/2014 1020   ALBUMIN 2.0* 12/02/2014 2006   ALBUMIN 2.6* 09/09/2014 1020   AST 35 12/02/2014 2006   AST 37* 09/09/2014 1020   ALT 89* 12/02/2014 2006    ALT 35 09/09/2014 1020   ALKPHOS 354* 12/02/2014 2006   ALKPHOS 65 09/09/2014 1020   BILITOT 0.8 12/02/2014 2006   BILITOT 0.50 09/09/2014 1020   GFRNONAA 86* 12/03/2014 0700   GFRAA >90 12/03/2014 0700      Assessment and plan: 69 yo male with metastatic esophageal CA and recent brain abscess who presented obtunded, afib w/rvr, hypoxic resp failure, septic shock requiring pressors  1. Code Status: DNR  2. GOC: Transitioned to comfort care today. i spoke with HCPOA who is doing okay. I am surprised he has perked up a little today. Will see how he is doing tomorrow. Potentially could look at transfer to hospice facility depending on stability.  Spoke with family at bedside as well.   3. Symptom Management:  1. Pain/Dyspnea/Agitation-dilaudid PRN. Controlling symptoms. Could consider scheduling low dose 0.5mg  q4-6h if becomes more tachypneic. Seems to be doing okay at the moment.  2. Increased secretions- problematic today. Atropine drops ordered. We can try scop patch as well. If becomes issue later would elevate head to 30 degrees  4. Psychosocial/Spiritual: Born in [place] and lived in [place] for majority of his life.  Former [employer] operational officer. Lives with his granddaughter her.   Total Time: 25 minutes >50% of time spent in counseling and coordination of care regarding above.  Discussed case with Dr Maretta Bees D.O. Palliative Medicine Team at [facility]  Pager: [phone] Team Phone: [phone]

## 2014-12-04 NOTE — Progress Notes (Signed)
Pt transferred to 1519. Left unit on stretcher pushed by this rn and tech accompanied by family. Arrived in stable condition. Vwilliams,rn.

## 2014-12-04 NOTE — Progress Notes (Signed)
Nutrition Brief Note  Pt identified as at nutrition risk on the Malnutrition Screen Tool  Chart reviewed. Pt now transitioning to comfort care.  No nutrition interventions warranted at this time.  Please consult as needed.   Dmarcus Decicco, MS, RD, LDN Pager: [phone] After Hours Pager: [phone]    

## 2014-12-04 NOTE — Progress Notes (Signed)
Visited with present members of the family to follow as ameans of offering presence and service.  Discussed this time of waiting to the grand daughter (the Medical POA) and her sister, that there is no knowing, no predictions of any accuracy, no way of telling now when the patient will transition beyond this life.  [place] daughter stated she knows he would not want to suffer.  Mentioned that whatever choices are made, staff will always seek to diminish pain.  Loann Quill, Chaplain Pager: [phone]

## 2014-12-04 NOTE — Progress Notes (Signed)
[facility] PROGRESS NOTE  [name] [name] OIZ:[id number] DOB: 1946-04-20 DOA: 12/02/2014 PCP: [name], MD  Assessment/Plan: 1. Septic shock -Present on admission, evidenced by a temperature of 91.3, heart rate of 130, respiratory 38, lactic acid of 3.21, hypotension requiring IV pressor support with neomycin -Clinic source of infection could be urinary tract infection given the presence of nitrates as well as healthcare associated pneumonia from diffuse opacities seen on chest x-ray. -AB's discontinued as pt transitioned to comfort  2.  Possible healthcare associated pneumonia -Patient presenting in septic shock, chest x-ray showing diffuse opacities bilaterally.  3.  Atrial fibrillation with rapid ventricular response -Likely secondary to sepsis, hypovolemia, pneumonia -Stopped amiodarone due to transition to comfort  7.  Goals of care -I spoke to family this morning, they have decided to pursue comfort care, while not subjecting [name] [name] to further invasive procedures. Palliative care following. Will transfer to Med/Surg with comfort care orders.   Code Status: DO NOT RESUSCITATE Family Communication: Spoke with patient's granddaughters and niece Disposition Plan: Transfer to Med/Surg   Consultants:  Pulmonary critical care medicine  Palliative care   Antibiotics:  Vancomycin  Zosyn  HPI/Subjective: Patient is an unfortunate 69 year old gentleman with a past medical history metastatic esophageal cancer, status post resection, chemotherapy radiation therapy, admitted to the medicine service on 12/03/2014 presenting with failure to thrive over the past 2 weeks with a steep functional decline in the last 24-48 hours. Patient found to be minimally responsive, septic, having temperature of 91.5, blood pressure of 82/62, heart rates in the 120, started on IV pressors and admitted to the intensive care unit. Initial workup included a chest x-ray showing diffuse  opacities that could be concerning for edema per radiology. Also had a urinalysis that showed the presence of nitrates. CT scan of brain was negative. Patient was started on broad-spectrum IV antibiotic therapy with vancomycin and Zosyn. Pulmonary critical care medicine following. During my evaluation patient is unresponsive and remains on pressor support. Goals of care meeting held with patient's granddaughters and niece, confirmed DO NOT RESUSCITATE CODE STATUS. They expressed wishes to continue current medical management. Palliative care consulted.  Objective: Filed Vitals:   12/04/14 0600  BP:   Pulse: 73  Temp: 94.5 F (34.7 C)  Resp: 8    Intake/Output Summary (Last 24 hours) at 12/04/14 0807 Last data filed at 12/04/14 0600  Gross per 24 hour  Intake 3871.57 ml  Output    655 ml  Net 3216.57 ml   Filed Weights   12/02/14 2059  Weight: 81.647 kg (180 lb)    Exam:   General:  Critically ill appearing, unresponsive, toxic  Cardiovascular: Tachycardic, regular rate and rhythm normal S1-S2  Respiratory: Coarse respiratory sounds, positive by lateral crackles, rhonchi, rales  Abdomen: Appeared to be soft, could not elicit tenderness to palpation  Musculoskeletal: Bilateral muscle atrophy, no edema  Data Reviewed: Basic Metabolic Panel:  Recent Labs Lab 12/02/14 2006 12/03/14 0209 12/03/14 0700  NA 145 144 146*  K 4.1 3.5 3.5  CL 111 118* 119*  CO2 25 17* 19  GLUCOSE 609* 424* 279*  BUN 48* 40* 40*  CREATININE 0.90 0.82 0.86  CALCIUM 8.4 7.0* 7.6*  MG  --   --  1.5   Liver Function Tests:  Recent Labs Lab 12/02/14 2006  AST 35  ALT 89*  ALKPHOS 354*  BILITOT 0.8  PROT 4.6*  ALBUMIN 2.0*   No results for input(s): LIPASE, AMYLASE in the last 168 hours.  Recent Labs Lab 12/02/14 2011  AMMONIA 50*   CBC:  Recent Labs Lab 12/02/14 2006 12/03/14 0209 12/03/14 0700  WBC 5.0 4.2 5.7  NEUTROABS 4.5  --   --   HGB 13.7 11.4* 10.4*  HCT 42.5  35.2* 31.9*  MCV 104.4* 103.8* 104.6*  PLT 48* 41* 44*   Cardiac Enzymes:  Recent Labs Lab 12/03/14 0209 12/03/14 0615 12/03/14 0700 12/03/14 1056 12/03/14 1816  TROPONINI 0.03 0.05* 0.06* 0.09* 0.09*   BNP (last 3 results) No results for input(s): BNP in the last 8760 hours.  ProBNP (last 3 results)  Recent Labs  09/15/14 1320 09/17/14 0455  PROBNP 2151.0* 642.5*    CBG:  Recent Labs Lab 12/03/14 1129 12/03/14 1224 12/03/14 1331 12/03/14 1436 12/03/14 1640  GLUCAP 108* 129* 125* 108* 126*    Recent Results (from the past 240 hour(s))  Blood Culture (routine x 2)     Status: None (Preliminary result)   Collection Time: 12/02/14  2:10 AM  Result Value Ref Range Status   Specimen Description BLOOD LEFT ARM  Final   Special Requests BOTTLES DRAWN AEROBIC ONLY 6CC  Final   Culture   Final           BLOOD CULTURE RECEIVED NO GROWTH TO DATE CULTURE WILL BE HELD FOR 5 DAYS BEFORE ISSUING A FINAL NEGATIVE REPORT Performed at [facility]    Report Status PENDING  Incomplete  Blood Culture (routine x 2)     Status: None (Preliminary result)   Collection Time: 12/02/14  9:01 PM  Result Value Ref Range Status   Specimen Description BLOOD PORT  Final   Special Requests BOTTLES DRAWN AEROBIC AND ANAEROBIC 5CC  Final   Culture   Final    GRAM POSITIVE COCCI IN PAIRS AND CHAINS Note: Gram Stain Report Called to,Read Back By and Verified With: Brynda Greathouse @ [phone] ON [id number] BY [facility] Performed at [facility]    Report Status PENDING  Incomplete  MRSA PCR Screening     Status: None   Collection Time: 12/03/14  1:21 AM  Result Value Ref Range Status   MRSA by PCR NEGATIVE NEGATIVE Final    Comment:        The GeneXpert MRSA Assay (FDA approved for NASAL specimens only), is one component of a comprehensive MRSA colonization surveillance program. It is not intended to diagnose MRSA infection nor to guide or monitor treatment for MRSA infections.       Studies: Dg Chest 1 View  12/02/2014   CLINICAL DATA:  Altered mental status, recent pneumonia  EXAM: CHEST  1 VIEW  COMPARISON:  09/28/2014  FINDINGS: There is a right IJ porta catheter with tip at the upper right atrium.  Mild cardiomegaly which is chronic. Stable and normal heart size and mediastinal contours. There are chronic reticular opacities in the lower lungs with pleural thickening and probable fissural fluid on the right. No definite superimposed pneumonia or edema.  IMPRESSION: Chronic lung disease without definite superimposed pneumonia.   Electronically Signed   By: Monte Fantasia M.D.   On: 12/02/2014 21:10   Ct Head Wo Contrast  12/02/2014   CLINICAL DATA:  Altered mental status. Metastatic pancreatic and esophageal carcinomas. Previous resection of brain metastasis.  EXAM: CT HEAD WITHOUT CONTRAST  TECHNIQUE: Contiguous axial images were obtained from the base of the skull through the vertex without intravenous contrast.  COMPARISON:  Brain MRI on 10/07/2014  FINDINGS: There is no evidence of  intracranial hemorrhage, brain edema, or other signs of acute infarction. There is no evidence of intracranial mass lesion or mass effect. No abnormal extraaxial fluid collections are identified.  Mild encephalomalacia is seen in the left parietal lobe at site of prior craniotomy. Ventricles stable in size. Mild chronic small vessel disease again noted.  IMPRESSION: No acute intracranial abnormality.  Mild chronic small vessel disease. Prior left parietal craniotomy and mild encephalomalacia.   Electronically Signed   By: Earle Gell M.D.   On: 12/02/2014 21:29   Dg Chest Port 1 View  12/03/2014   CLINICAL DATA:  Rales  EXAM: PORTABLE CHEST - 1 VIEW  COMPARISON:  12/02/2014  FINDINGS: New diffuse airspace and interstitial opacity. Small bilateral pleural effusions.  Stable heart size and aortic contours.  Right IJ porta catheter, tip at the upper cavoatrial junction.  No pneumothorax.  IVC filter  noted, partially visualized.  IMPRESSION: Increasing lung opacification compatible with edema.   Electronically Signed   By: Monte Fantasia M.D.   On: 12/03/2014 00:28    Scheduled Meds: . lacosamide (VIMPAT) IV  100 mg Intravenous Q12H  . levETIRAcetam  1,500 mg Intravenous BID  . sodium chloride  3 mL Intravenous Q12H   Continuous Infusions: . dextrose 5 % and 0.45% NaCl Stopped (12/03/14 2030)    Principal Problem:   Sepsis Active Problems:   Dysphagia   Esophageal carcinoma   Atrial fibrillation   Severe protein-calorie malnutrition   Hyperglycemia   Hypotension   UTI (lower urinary tract infection)   Acute encephalopathy   Anasarca   Hypothermia   Septic shock   PNA (pneumonia)   Blood poisoning   Dyspnea and respiratory abnormality   Palliative care encounter    Time spent: 25 minutes    Kelvin Cellar  [facility] Pager [phone]. If 7PM-7AM, please contact night-coverage at [email or web address], password [email or web address] 12/04/2014, 8:07 AM  LOS: 2 days

## 2014-12-05 DIAGNOSIS — I482 Chronic atrial fibrillation: Secondary | ICD-10-CM

## 2014-12-05 LAB — CULTURE, BLOOD (ROUTINE X 2)

## 2014-12-05 MED ORDER — HYDROMORPHONE HCL 1 MG/ML IJ SOLN
0.5000 mg | Freq: Four times a day (QID) | INTRAMUSCULAR | Status: DC
  Administered 2014-12-05 – 2014-12-06 (×4): 0.5 mg via INTRAVENOUS
  Filled 2014-12-05 (×4): qty 1

## 2014-12-05 NOTE — Progress Notes (Signed)
[facility] PROGRESS NOTE  [name] [name] QIH:[id number] DOB: 08-21-1946 DOA: 12/02/2014 PCP: [name] Palau, MD  Assessment/Plan: 1. Septic shock -Present on admission, evidenced by a temperature of 91.3, heart rate of 130, respiratory 38, lactic acid of 3.21, hypotension requiring IV pressor support with neomycin -Clinic source of infection could be urinary tract infection given the presence of nitrates as well as healthcare associated pneumonia from diffuse opacities seen on chest x-ray. -AB's discontinued as pt transitioned to comfort  2.  Possible healthcare associated pneumonia -Patient presenting in septic shock, chest x-ray showing diffuse opacities bilaterally.  3.  Atrial fibrillation with rapid ventricular response -Likely secondary to sepsis, hypovolemia, pneumonia -Stopped amiodarone due to transition to comfort  7.  Goals of care -I spoke to family and patient today. Family and patient in agreement with transitioning to inpatient hospice. Social work consulted, it appears [facility] will be able to accept patient tomorrow.  Code Status: DO NOT RESUSCITATE Family Communication: Spoke with patient's granddaughters and niece Disposition Plan: Anticipate transition to inpatient hospice facility in the next 24 hours   Consultants:  Pulmonary critical care medicine  Palliative care   Antibiotics:  Vancomycin  Zosyn  HPI/Subjective: Patient is an unfortunate 69 year old gentleman with a past medical history metastatic esophageal cancer, status post resection, chemotherapy radiation therapy, admitted to the medicine service on 12/03/2014 presenting with failure to thrive over the past 2 weeks with a steep functional decline in the last 24-48 hours. Patient found to be minimally responsive, septic, having temperature of 91.5, blood pressure of 82/62, heart rates in the 120, started on IV pressors and admitted to the intensive care unit. Initial workup included  a chest x-ray showing diffuse opacities that could be concerning for edema per radiology. Also had a urinalysis that showed the presence of nitrates. CT scan of brain was negative. Patient was started on broad-spectrum IV antibiotic therapy with vancomycin and Zosyn. Pulmonary critical care medicine following. During my evaluation patient is unresponsive and remains on pressor support. Goals of care meeting held with patient's granddaughters and niece, confirmed DO NOT RESUSCITATE CODE STATUS. They expressed wishes to continue current medical management. Palliative care consulted.  Objective: Filed Vitals:   12/05/14 0545  BP: 107/74  Pulse: 133  Temp: 97.4 F (36.3 C)  Resp: 13    Intake/Output Summary (Last 24 hours) at 12/05/14 1727 Last data filed at 12/04/14 2028  Gross per 24 hour  Intake      0 ml  Output    125 ml  Net   -125 ml   Filed Weights   12/02/14 2059  Weight: 81.647 kg (180 lb)    Exam:   General:  Patient appears stable, he is able to nod to questions, did not appear to be in significant distress.  Cardiovascular: Tachycardic, regular rate and rhythm normal S1-S2  Respiratory: Coarse respiratory sounds, positive by lateral crackles, rhonchi, rales  Abdomen: Appeared to be soft, could not elicit tenderness to palpation  Musculoskeletal: Bilateral muscle atrophy, no edema  Data Reviewed: Basic Metabolic Panel:  Recent Labs Lab 12/02/14 2006 12/03/14 0209 12/03/14 0700  NA 145 144 146*  K 4.1 3.5 3.5  CL 111 118* 119*  CO2 25 17* 19  GLUCOSE 609* 424* 279*  BUN 48* 40* 40*  CREATININE 0.90 0.82 0.86  CALCIUM 8.4 7.0* 7.6*  MG  --   --  1.5   Liver Function Tests:  Recent Labs Lab 12/02/14 2006  AST 35  ALT 89*  ALKPHOS 354*  BILITOT 0.8  PROT 4.6*  ALBUMIN 2.0*   No results for input(s): LIPASE, AMYLASE in the last 168 hours.  Recent Labs Lab 12/02/14 2011  AMMONIA 50*   CBC:  Recent Labs Lab 12/02/14 2006 12/03/14 0209  12/03/14 0700  WBC 5.0 4.2 5.7  NEUTROABS 4.5  --   --   HGB 13.7 11.4* 10.4*  HCT 42.5 35.2* 31.9*  MCV 104.4* 103.8* 104.6*  PLT 48* 41* 44*   Cardiac Enzymes:  Recent Labs Lab 12/03/14 0209 12/03/14 0615 12/03/14 0700 12/03/14 1056 12/03/14 1816  TROPONINI 0.03 0.05* 0.06* 0.09* 0.09*   BNP (last 3 results) No results for input(s): BNP in the last 8760 hours.  ProBNP (last 3 results)  Recent Labs  09/15/14 1320 09/17/14 0455  PROBNP 2151.0* 642.5*    CBG:  Recent Labs Lab 12/03/14 1129 12/03/14 1224 12/03/14 1331 12/03/14 1436 12/03/14 1640  GLUCAP 108* 129* 125* 108* 126*    Recent Results (from the past 240 hour(s))  Blood Culture (routine x 2)     Status: None (Preliminary result)   Collection Time: 12/02/14  2:10 AM  Result Value Ref Range Status   Specimen Description BLOOD LEFT ARM  Final   Special Requests BOTTLES DRAWN AEROBIC ONLY 6CC  Final   Culture   Final           BLOOD CULTURE RECEIVED NO GROWTH TO DATE CULTURE WILL BE HELD FOR 5 DAYS BEFORE ISSUING A FINAL NEGATIVE REPORT Performed at [facility]    Report Status PENDING  Incomplete  Blood Culture (routine x 2)     Status: None   Collection Time: 12/02/14  9:01 PM  Result Value Ref Range Status   Specimen Description BLOOD PORT  Final   Special Requests BOTTLES DRAWN AEROBIC AND ANAEROBIC 5CC  Final   Culture   Final    ENTEROCOCCUS SPECIES Note: Gram Stain Report Called to,Read Back By and Verified With: Brynda Greathouse @ 0932 ON [id number] BY [facility] Performed at [facility]    Report Status 12/05/2014 FINAL  Final   Organism ID, Bacteria ENTEROCOCCUS SPECIES  Final      Susceptibility   Enterococcus species - MIC*    AMPICILLIN <=2 SENSITIVE Sensitive     VANCOMYCIN 1 SENSITIVE Sensitive     * ENTEROCOCCUS SPECIES  Urine culture     Status: None   Collection Time: 12/02/14  9:12 PM  Result Value Ref Range Status   Specimen Description URINE, CATHETERIZED  Final    Special Requests NONE  Final   Colony Count NO GROWTH Performed at [facility]   Final   Culture NO GROWTH Performed at [facility]   Final   Report Status 12/04/2014 FINAL  Final  MRSA PCR Screening     Status: None   Collection Time: 12/03/14  1:21 AM  Result Value Ref Range Status   MRSA by PCR NEGATIVE NEGATIVE Final    Comment:        The GeneXpert MRSA Assay (FDA approved for NASAL specimens only), is one component of a comprehensive MRSA colonization surveillance program. It is not intended to diagnose MRSA infection nor to guide or monitor treatment for MRSA infections.      Studies: No results found.  Scheduled Meds: .  HYDROmorphone (DILAUDID) injection  0.5 mg Intravenous 4 times per day  . scopolamine  1 patch Transdermal Q72H  . sodium chloride  3 mL Intravenous Q12H  Continuous Infusions:    Principal Problem:   Sepsis Active Problems:   Dysphagia   Esophageal carcinoma   Atrial fibrillation   Severe protein-calorie malnutrition   Hyperglycemia   Hypotension   UTI (lower urinary tract infection)   Acute encephalopathy   Anasarca   Hypothermia   Septic shock   PNA (pneumonia)   Blood poisoning   Dyspnea and respiratory abnormality   Palliative care encounter   Increased oropharyngeal secretions    Time spent: 20 minutes    Kelvin Cellar  [facility] Pager [phone]. If 7PM-7AM, please contact night-coverage at [email or web address], password [facility] 12/05/2014, 5:27 PM  LOS: 3 days

## 2014-12-05 NOTE — Progress Notes (Addendum)
Clinical Social Work Department BRIEF PSYCHOSOCIAL ASSESSMENT 12/05/2014  Patient:  [name], [name]     Account Number:  [id number]     Admit date:  12/02/2014  Clinical Social Worker:  Earlie Server  Date/Time:  12/05/2014 01:30 PM  Referred by:  Physician  Date Referred:  12/05/2014 Referred for  Residential hospice placement   Other Referral:   Interview type:  Family Other interview type:   POA- Mrs. Vicie Mutters    PSYCHOSOCIAL DATA Living Status:  FAMILY Admitted from facility:   Level of care:   Primary support name:  [name] Primary support relationship to patient:  FAMILY Degree of support available:   Strong    CURRENT CONCERNS Current Concerns  Post-Acute Placement   Other Concerns:    SOCIAL WORK ASSESSMENT / PLAN Patient was discussed during progression meeting and MD reports that he feels patient would be stable to DC from the hospital with hospice services. CSW reviewed chart and met with patient and niece Tim Lair) at bedside. CSW introduced myself and explained role. Patient sleeping and did not participate in assessment.    Niece reports she is a caregiver of patient and visits often. Niece is not POA but reports there are some difficult dynamics between her and patient's granddtr who is POA. Niece reports she and family want residential hospice but is concerned that POA will not make the same decision. CSW explained process and that POA will have ultimate decisions re: patient's care.    CSW spoke with POA via phone. POA reports patient has been in and out of [employer] since December but that he returned home about 1 week ago. POA reports she hired two caregivers who stay with patient during the day while she is at work and then she provides care at night. POA reports if patient is to be DC from the hospital then she would prefer residential hospice placement. CSW offered choice and patient prefers [facility] but works in [employer] as well. POA prefers  [facility] so that other family can visit but reports if [facility] is unable to accept then [employer] would be her secondary choice.    CSW made referral to Harmon Pier at [facility] and will await to hear if [facility] can accept patient.   Assessment/plan status:  Psychosocial Support/Ongoing Assessment of Needs Other assessment/ plan:   Information/referral to community resources:   Hospice choice    PATIENT'S/FAMILY'S RESPONSE TO PLAN OF CARE: Patient unable to participate in assessment. Patient's family report discord among members and difficulty with decision making. CSW validated niece's feelings but also educated niece on POA's rights. POA involved and reports she is feeling overwhelmed with making decisions but just wants to ensure that patient is getting the proper treatment. POA is hopeful for [facility] so that all family will be content and able to visit patient as well. POA reports she plans on relying on her husband for additional support but knows that if patient can be placed at hospice then she could use resources at hospice for support as well. POA has CSW contact information if further needs arise.     [name] [name], [place] [phone]     Addendum [phone] CSW spoke with [facility] representative Harmon Pier) who reports patient can be admitted to [facility]. Harmon Pier to contact family to complete paperwork. CSW will assist with transfer to [facility] tomorrow. CSW text paged MD with DC plans.

## 2014-12-05 NOTE — Progress Notes (Signed)
Patient HB:[id number] Scheff      DOB: [birth year]      ELF:[id number]   Palliative Medicine Team at [facility] Progress Note    Subjective: Nods head yes to pain. Unable to verbalize. Denies any dyspnea, Nausea    Filed Vitals:   12/05/14 0545  BP: 107/74  Pulse: 133  Temp: 97.4 F (36.3 C)  Resp: 13   Physical exam: GEN: lethargic but some interaction CV: tachy ABD: soft, NT, ND EXT: warm   Assessment and plan: 69 yo male with metastatic esophageal CA and recent brain abscess who presented obtunded, afib w/rvr, hypoxic resp failure, septic shock requiring pressors  1. Code Status: DNR  2. GOC: Comfort care.  SW working with family about residential hospice placement. I think this is very appropriate and he appears stable enough to make transfer as of this afternoon  3. Symptom Management:  1. Pain/Dyspnea/Agitation-dilaudid PRN. Will schedule 0.5mg  IV dilaudid q6h as he is endorsing some pain and has limited ability to ask for medicines Increased secretions- scop patch, atropine drops  4. Psychosocial/Spiritual: Born in [place] and lived in [place] for majority of his life. Former [employer] operational officer. Lives with his granddaughter her.  Doran Clay D.O. Palliative Medicine Team at [facility]  Pager: [phone] Team Phone: [phone]

## 2014-12-06 MED ORDER — OXYCODONE HCL 5 MG PO TABS: 5.0000 mg | ORAL_TABLET | ORAL | Status: AC | PRN

## 2014-12-06 MED ORDER — SCOPOLAMINE 1 MG/3DAYS TD PT72: 1.0000 | MEDICATED_PATCH | TRANSDERMAL | Status: AC

## 2014-12-06 MED ORDER — ATROPINE SULFATE 1 % OP SOLN: 2.0000 [drp] | OPHTHALMIC | Status: AC | PRN

## 2014-12-06 NOTE — Consult Note (Signed)
[facility] Liaison: [facility] room available this morning for Mr. Wurster. Plan to meet with HCPOA/grand daughter at 61 today at hospital. Will need DC summary faxed to [phone]. Will need RN to call report to [phone]. Will let CSW Earnest Bailey know when paper work complete. Thank you. Erling Conte LCSW [phone]

## 2014-12-06 NOTE — Discharge Summary (Addendum)
Physician Discharge Summary  [name] ZGY:[id number] DOB: 1945-10-20 DOA: 12/02/2014  PCP: [name], MD  Admit date: 12/02/2014 Discharge date: 12/06/2014  Time spent: 35 minutes  Recommendations for Outpatient Follow-up:  1. Patient being transitioned to comfort care  Discharge Diagnoses:  Principal Problem:   Septic shock Active Problems:   Esophageal carcinoma   Dysphagia   Atrial fibrillation   Severe protein-calorie malnutrition   Hyperglycemia   Sepsis   Hypotension   UTI (lower urinary tract infection)   Acute encephalopathy   Anasarca   Hypothermia   PNA (pneumonia)   Blood poisoning   Dyspnea and respiratory abnormality   Palliative care encounter   Increased oropharyngeal secretions   Discharge Condition: Stable for transfer to Inpatient Hospice  Diet recommendation: As tolerated  Filed Weights   12/02/14 2059  Weight: 81.647 kg (180 lb)    History of present illness:  [name] [name] is a 69 y.o. 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 The history is peer his grand-daughter who is his primary caregiver and she reports that he has had a poor intake of foods and liquids. In the ED, he was found to have hypothermia and a glucose level of 609. His lactic acid level was elevated and he was placed on the Sepsis protocol, and his UA was positive. He was referred for medical admission.  Hospital Course:  Patient is an unfortunate 69 year old gentleman with a past medical history metastatic esophageal cancer, status post resection, chemotherapy radiation therapy, admitted to the medicine service on 12/03/2014 presenting with failure to thrive over the past 2 weeks with a steep functional decline in the  last 24-48 hours. Patient found to be minimally responsive, septic, having temperature of 91.5, blood pressure of 82/62, heart rates in the 120, started on IV pressors and admitted to the intensive care unit. Initial workup included a chest x-ray showing diffuse opacities that could be concerning for edema per radiology. Also had a urinalysis that showed the presence of nitrates. CT scan of brain was negative. Patient was started on broad-spectrum IV antibiotic therapy with vancomycin and Zosyn. Pulmonary critical care medicine following. During my evaluation patient is unresponsive and remains on pressor support. Goals of care meeting held with patient's granddaughters and niece, confirmed DO NOT RESUSCITATE CODE STATUS. They expressed wishes to continue current medical management. Palliative care consulted. Patient was transition to comfort care as IV pressors, antibiotic therapy, IV fluids were discontinued. He was transferred to [place]. Family expressing wishes for transition to inpatient hospice facility. Social work consulted, he was accepted to [facility]. Patient discharged to inpatient hospice on 12/06/2014.   Consultations:  Pulmonary critical care medicine  Palliative care  Social work    Discharge Exam: Filed Vitals:   12/06/14 0649  BP: 97/67  Pulse: 65  Temp: 98.3 F (36.8 C)  Resp: 12    General: Patient appears stable, he is able to nod to questions, did not appear to be in significant distress.  Cardiovascular: Tachycardic, regular rate and rhythm normal S1-S2  Respiratory: Coarse respiratory sounds, positive by lateral crackles, rhonchi, rales  Abdomen: Appeared to be soft, could not elicit tenderness to palpation  Musculoskeletal: Bilateral muscle atrophy, no edema  Discharge Instructions   Discharge Instructions  Call MD for:  difficulty breathing, headache or visual disturbances    Complete by:  As directed      Call MD for:  extreme fatigue     Complete by:  As directed      Call MD for:  hives    Complete by:  As directed      Call MD for:  persistant dizziness or light-headedness    Complete by:  As directed      Call MD for:  persistant nausea and vomiting    Complete by:  As directed      Call MD for:  redness, tenderness, or signs of infection (pain, swelling, redness, odor or green/yellow discharge around incision site)    Complete by:  As directed      Call MD for:  severe uncontrolled pain    Complete by:  As directed      Call MD for:  temperature >100.4    Complete by:  As directed      Diet - low sodium heart healthy    Complete by:  As directed      Increase activity slowly    Complete by:  As directed           Current Discharge Medication List    START taking these medications   Details  atropine 1 % ophthalmic solution Place 2 drops under the tongue every 2 (two) hours as needed (for secretioins). Qty: 2 mL, Refills: 12    scopolamine (TRANSDERM-SCOP) 1 MG/3DAYS Place 1 patch (1.5 mg total) onto the skin every 3 (three) days. Qty: 10 patch, Refills: 12      CONTINUE these medications which have CHANGED   Details  oxyCODONE (OXY IR/ROXICODONE) 5 MG immediate release tablet Take 1 tablet (5 mg total) by mouth every 4 (four) hours as needed for moderate pain. Qty: 15 tablet, Refills: 0      CONTINUE these medications which have NOT CHANGED   Details  acetaminophen (TYLENOL) 325 MG tablet Take 2 tablets (650 mg total) by mouth every 4 (four) hours as needed for mild pain (temp > 100.5).    albuterol (PROVENTIL HFA;VENTOLIN HFA) 108 (90 BASE) MCG/ACT inhaler Inhale into the lungs every 6 (six) hours as needed for wheezing or shortness of breath.    levETIRAcetam (KEPPRA) 750 MG tablet Take 2 tablets (1,500 mg total) by mouth 2 (two) times daily. Qty: 60 tablet, Refills: 1      STOP taking these medications     dexamethasone (DECADRON) 4 MG tablet      finasteride (PROSCAR) 5 MG tablet       lacosamide 100 MG TABS      levofloxacin (LEVAQUIN) 750 MG tablet      metroNIDAZOLE (FLAGYL) 500 MG tablet      mirtazapine (REMERON) 7.5 MG tablet      omeprazole (PRILOSEC) 20 MG capsule      tiotropium (SPIRIVA) 18 MCG inhalation capsule      traMADol (ULTRAM) 50 MG tablet      cefTRIAXone (ROCEPHIN) 40 MG/ML IVPB      pantoprazole (PROTONIX) 40 MG tablet      warfarin (COUMADIN) 2 MG tablet        No Known Allergies    The results of significant diagnostics from this hospitalization (including imaging, microbiology, ancillary and laboratory) are listed below for reference.    Significant Diagnostic Studies: Dg Chest 1 View  12/02/2014   CLINICAL DATA:  Altered mental status, recent  pneumonia  EXAM: CHEST  1 VIEW  COMPARISON:  09/28/2014  FINDINGS: There is a right IJ porta catheter with tip at the upper right atrium.  Mild cardiomegaly which is chronic. Stable and normal heart size and mediastinal contours. There are chronic reticular opacities in the lower lungs with pleural thickening and probable fissural fluid on the right. No definite superimposed pneumonia or edema.  IMPRESSION: Chronic lung disease without definite superimposed pneumonia.   Electronically Signed   By: Monte Fantasia M.D.   On: 12/02/2014 21:10   Ct Head Wo Contrast  12/02/2014   CLINICAL DATA:  Altered mental status. Metastatic pancreatic and esophageal carcinomas. Previous resection of brain metastasis.  EXAM: CT HEAD WITHOUT CONTRAST  TECHNIQUE: Contiguous axial images were obtained from the base of the skull through the vertex without intravenous contrast.  COMPARISON:  Brain MRI on 10/07/2014  FINDINGS: There is no evidence of intracranial hemorrhage, brain edema, or other signs of acute infarction. There is no evidence of intracranial mass lesion or mass effect. No abnormal extraaxial fluid collections are identified.  Mild encephalomalacia is seen in the left parietal lobe at site of prior  craniotomy. Ventricles stable in size. Mild chronic small vessel disease again noted.  IMPRESSION: No acute intracranial abnormality.  Mild chronic small vessel disease. Prior left parietal craniotomy and mild encephalomalacia.   Electronically Signed   By: Earle Gell M.D.   On: 12/02/2014 21:29   Ir Ivc Filter Plmt / S&i /img Guid/mod Sed  11/22/2014   INDICATION: 69 year old male with acute right lower extremity DVT in the setting of metastatic disease to the brain which is a absolute contraindication anticoagulation. Caval interruption is warranted for PE prophylaxis.  EXAM: ULTRASOUND GUIDANCE FOR VASCULARACCESS  IVC CATHETERIZATION AND VENOGRAM  IVC FILTER INSERTION  COMPARISON:  Brain MRI 10/17/2014  MEDICATIONS: 0.5 mg Versed said administered intravenously  ANESTHESIA/SEDATION: Sedation Time  9 minutes  CONTRAST:  31mL OMNIPAQUE IOHEXOL 300 MG/ML  SOLN  FLUOROSCOPY TIME:  54 seconds  Two hundred ninety-six mGy minutes  COMPLICATIONS: None immediate  PROCEDURE: Informed consent was obtained from the patient's family following explanation of the procedure, risks, benefits and alternatives. The patient understands, agrees and consents for the procedure. All questions were addressed. A time out was performed prior to the initiation of the procedure.  Maximal barrier sterile technique utilized including caps, mask, sterile gowns, sterile gloves, large sterile drape, hand hygiene, and Betadine prep.  Under sterile condition and local anesthesia, right internal jugular venous access was performed with ultrasound. An ultrasound image was saved and sent to PACS. Over a guidewire, the IVC filter delivery sheath and inner dilator were advanced into the IVC just above the IVC bifurcation. Contrast injection was performed for an IVC venogram.  Through the delivery sheath, a retrievable Denali IVC filter was deployed below the level of the renal veins and above the IVC bifurcation. Limited post deployment venacavagram  was performed.  The delivery sheath was removed and hemostasis was obtained with manual compression. A dressing was placed. The patient tolerated the procedure well without immediate post procedural complication.  FINDINGS: The IVC is patent. No evidence of thrombus, stenosis, or occlusion. No variant venous anatomy. Successful placement of the IVC filter below the level of the renal veins.  IMPRESSION: Successful ultrasound and fluoroscopically guided placement of an infrarenal retrievable IVC filter via right jugular approach.  Due to patient related comorbidities and/or clinical necessity, this IVC filter should be considered a permanent device. This patient  will not be actively followed for future filter retrieval.  Signed,  Criselda Peaches, MD  [facility]  [facility]   Electronically Signed   By: Jacqulynn Cadet M.D.   On: 11/22/2014 22:07   Dg Chest Port 1 View  12/03/2014   CLINICAL DATA:  Rales  EXAM: PORTABLE CHEST - 1 VIEW  COMPARISON:  12/02/2014  FINDINGS: New diffuse airspace and interstitial opacity. Small bilateral pleural effusions.  Stable heart size and aortic contours.  Right IJ porta catheter, tip at the upper cavoatrial junction.  No pneumothorax.  IVC filter noted, partially visualized.  IMPRESSION: Increasing lung opacification compatible with edema.   Electronically Signed   By: Monte Fantasia M.D.   On: 12/03/2014 00:28    Microbiology: Recent Results (from the past 240 hour(s))  Blood Culture (routine x 2)     Status: None (Preliminary result)   Collection Time: 12/02/14  2:10 AM  Result Value Ref Range Status   Specimen Description BLOOD LEFT ARM  Final   Special Requests BOTTLES DRAWN AEROBIC ONLY 6CC  Final   Culture   Final           BLOOD CULTURE RECEIVED NO GROWTH TO DATE CULTURE WILL BE HELD FOR 5 DAYS BEFORE ISSUING A FINAL NEGATIVE REPORT Performed at [facility]    Report Status PENDING  Incomplete   Blood Culture (routine x 2)     Status: None   Collection Time: 12/02/14  9:01 PM  Result Value Ref Range Status   Specimen Description BLOOD PORT  Final   Special Requests BOTTLES DRAWN AEROBIC AND ANAEROBIC 5CC  Final   Culture   Final    ENTEROCOCCUS SPECIES Note: Gram Stain Report Called to,Read Back By and Verified With: Brynda Greathouse @ [id number] ON [id number] BY [facility] Performed at [facility]    Report Status 12/05/2014 FINAL  Final   Organism ID, Bacteria ENTEROCOCCUS SPECIES  Final      Susceptibility   Enterococcus species - MIC*    AMPICILLIN <=2 SENSITIVE Sensitive     VANCOMYCIN 1 SENSITIVE Sensitive     * ENTEROCOCCUS SPECIES  Urine culture     Status: None   Collection Time: 12/02/14  9:12 PM  Result Value Ref Range Status   Specimen Description URINE, CATHETERIZED  Final   Special Requests NONE  Final   Colony Count NO GROWTH Performed at [facility]   Final   Culture NO GROWTH Performed at [facility]   Final   Report Status 12/04/2014 FINAL  Final  MRSA PCR Screening     Status: None   Collection Time: 12/03/14  1:21 AM  Result Value Ref Range Status   MRSA by PCR NEGATIVE NEGATIVE Final    Comment:        The GeneXpert MRSA Assay (FDA approved for NASAL specimens only), is one component of a comprehensive MRSA colonization surveillance program. It is not intended to diagnose MRSA infection nor to guide or monitor treatment for MRSA infections.      Labs: Basic Metabolic Panel:  Recent Labs Lab 12/02/14 2006 12/03/14 0209 12/03/14 0700  NA 145 144 146*  K 4.1 3.5 3.5  CL 111 118* 119*  CO2 25 17* 19  GLUCOSE 609* 424* 279*  BUN 48* 40* 40*  CREATININE 0.90 0.82 0.86  CALCIUM 8.4 7.0* 7.6*  MG  --   --  1.5   Liver Function Tests:  Recent  Labs Lab 12/02/14 2006  AST 35  ALT 89*  ALKPHOS 354*  BILITOT 0.8  PROT 4.6*  ALBUMIN 2.0*   No results for input(s): LIPASE, AMYLASE in the last 168 hours.  Recent  Labs Lab 12/02/14 2011  AMMONIA 50*   CBC:  Recent Labs Lab 12/02/14 2006 12/03/14 0209 12/03/14 0700  WBC 5.0 4.2 5.7  NEUTROABS 4.5  --   --   HGB 13.7 11.4* 10.4*  HCT 42.5 35.2* 31.9*  MCV 104.4* 103.8* 104.6*  PLT 48* 41* 44*   Cardiac Enzymes:  Recent Labs Lab 12/03/14 0209 12/03/14 0615 12/03/14 0700 12/03/14 1056 12/03/14 1816  TROPONINI 0.03 0.05* 0.06* 0.09* 0.09*   BNP: BNP (last 3 results) No results for input(s): BNP in the last 8760 hours.  ProBNP (last 3 results)  Recent Labs  09/15/14 1320 09/17/14 0455  PROBNP 2151.0* 642.5*    CBG:  Recent Labs Lab 12/03/14 1129 12/03/14 1224 12/03/14 1331 12/03/14 1436 12/03/14 1640  GLUCAP 108* 129* 125* 108* 126*       Signed:  Ameilia Rattan  [facility] 12/06/2014, 9:37 AM

## 2014-12-06 NOTE — Progress Notes (Signed)
Patient discharge to [place], alert and orientation in question due to patient nonverbal, grand daughter at bedside during transport, by [facility] PTAR, foley cath will be left in place as well as [employer], My Chart access declined at this time, patient in fair condition at this time, discharge package given to transportation for delivery to [facility], our prays will remain with the family during this difficult time

## 2014-12-06 NOTE — Progress Notes (Signed)
Clinical Social Work  CSW spoke with [facility] who is agreeable to accept patient. RN to call report. CSW prepared DC packet with DNR, DC summary, hard scripts, and PTAR forms included. CSW met with patient and granddtr at bedside to discuss DC plans. Patient awake and holding granddtr's hand but did not participate in assessment. Granddtr reports she is happy with plan and that she can visit patient often at [facility]. Granddtr reports she is trying to make the best decisions for patient but is hopeful that he will receive great EOL care at [facility]. Patient and family aware and agreeable to PTAR.  PTAR request # [id number].  CSW is signing off but available if needed.  Guy, [place] [phone]

## 2014-12-09 LAB — CULTURE, BLOOD (ROUTINE X 2): CULTURE: NO GROWTH

## 2014-12-30 DEATH — deceased

## 2015-01-06 ENCOUNTER — Encounter: Payer: Self-pay | Admitting: Radiation Therapy

## 2015-01-06 ENCOUNTER — Other Ambulatory Visit: Payer: PRIVATE HEALTH INSURANCE

## 2015-01-06 NOTE — Progress Notes (Signed)
Per [name] niece and confirmed by his Iver Nestle, he passed away on December 09, 2014.     [name] [name]

## 2016-01-04 IMAGING — CT CT CHEST W/ CM
2 of 6 series · 15 of 46 positions shown, 17 images · IV contrast (OMNIPAQUE)
Comparison: No priors.

CLINICAL DATA: History of esophageal mass in the distal esophagus.
Evaluate for metastatic disease.

EXAM:
CT CHEST, ABDOMEN, AND PELVIS WITH CONTRAST
TECHNIQUE: Multidetector CT imaging of the chest, abdomen and pelvis was
performed following the standard protocol during bolus
administration of intravenous contrast.
CONTRAST:  100 mL of Omnipaque 300.

[Series 3: (person_name) recon · axial · 0.83mm/px · z∈[-631,-51]mm · 12 of 943 slices shown, 14 images]
[im 76/943  soft-tissue]
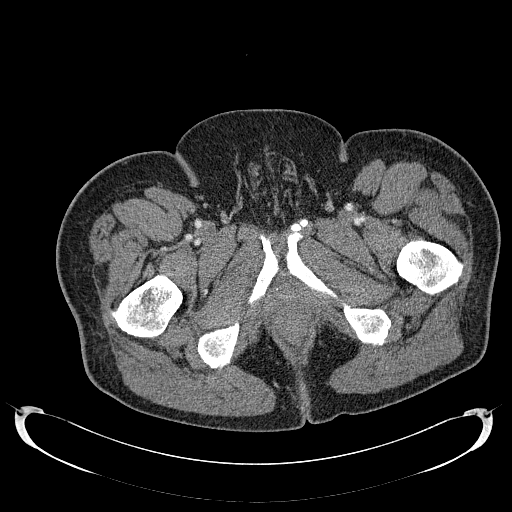
[im 76/943  bone]
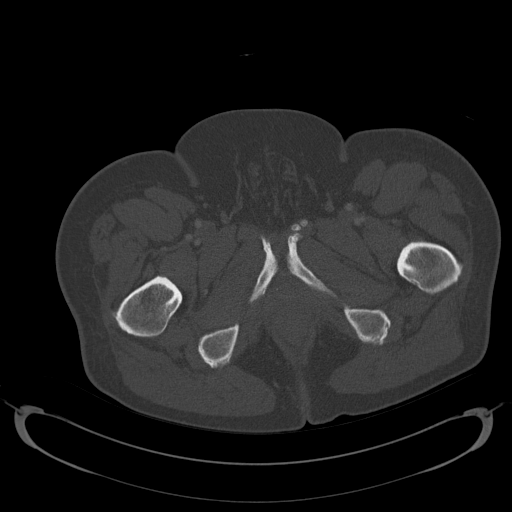
[im 151/943  soft-tissue]
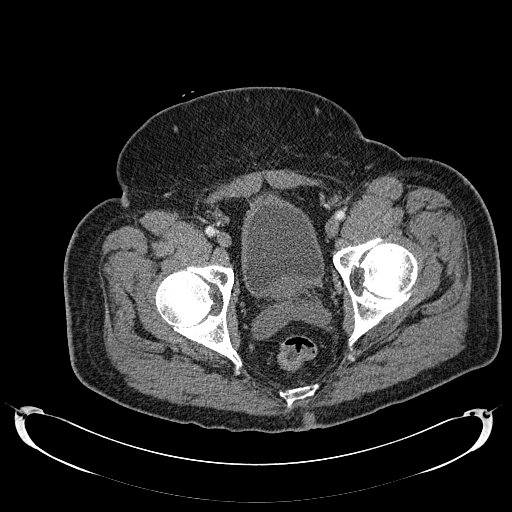
[im 227/943  soft-tissue]
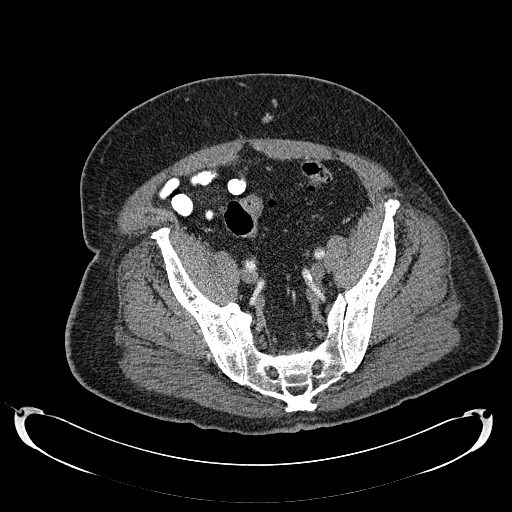
[im 302/943  soft-tissue]
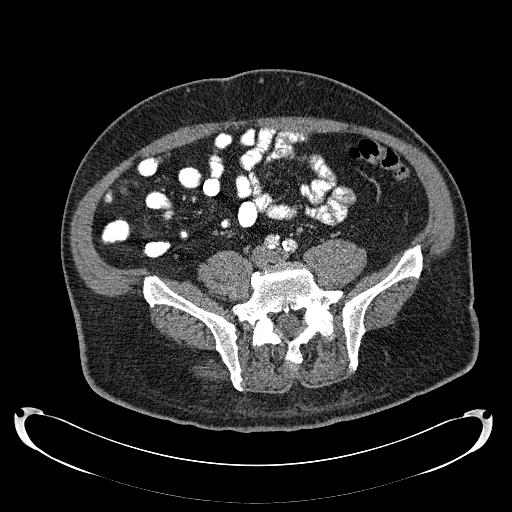
[im 377/943  soft-tissue]
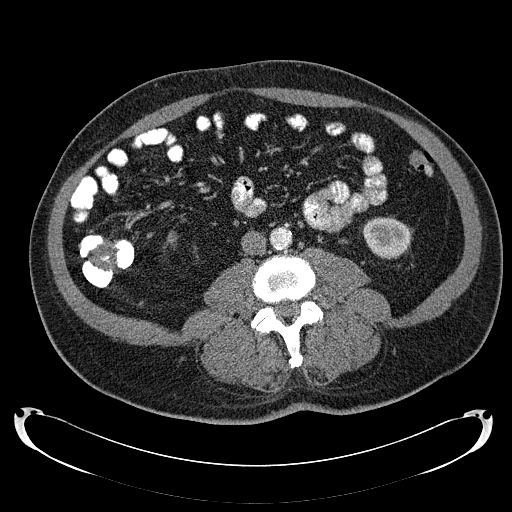
[im 453/943  soft-tissue]
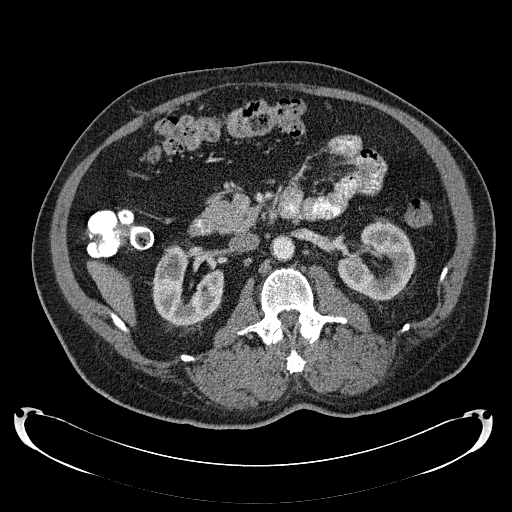
[im 528/943  soft-tissue]
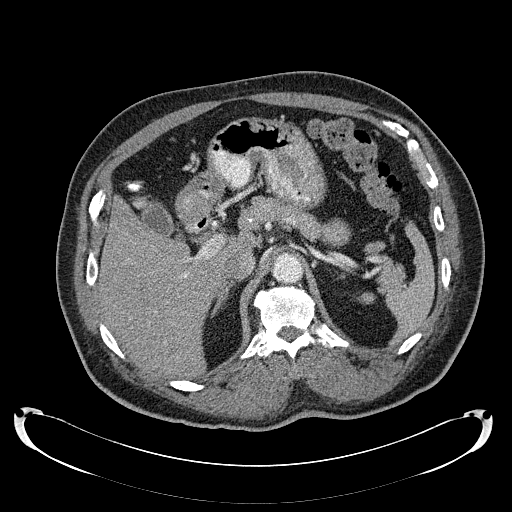
[im 603/943  soft-tissue]
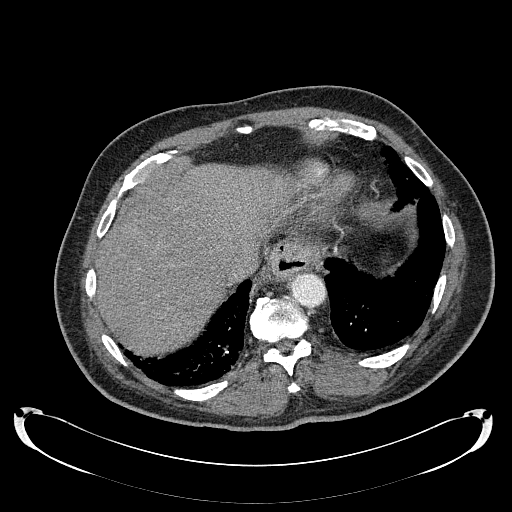
[im 679/943  soft-tissue]
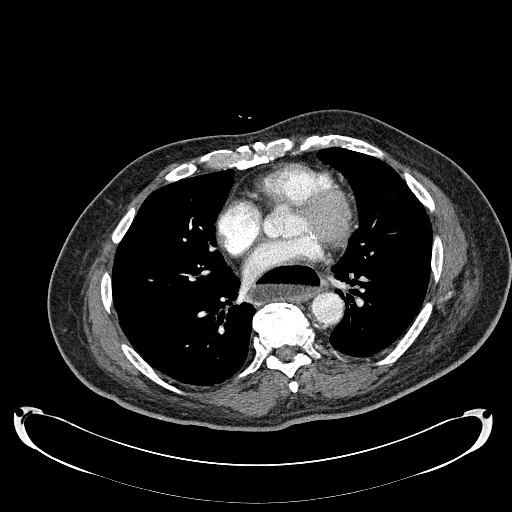
[im 679/943  bone]
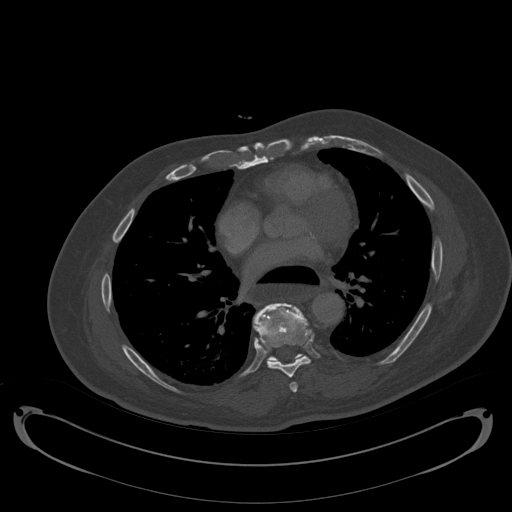
[im 754/943  soft-tissue]
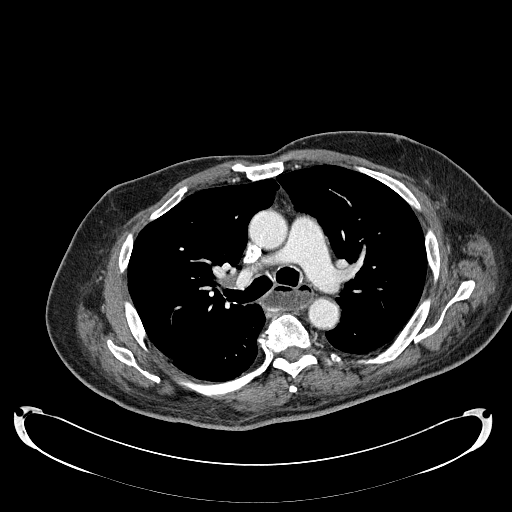
[im 829/943  soft-tissue]
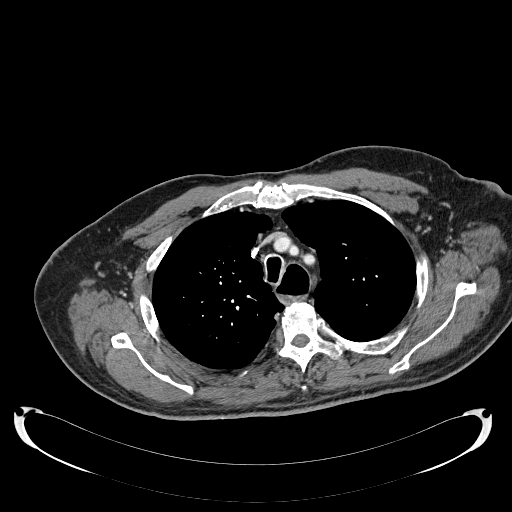
[im 905/943  soft-tissue]
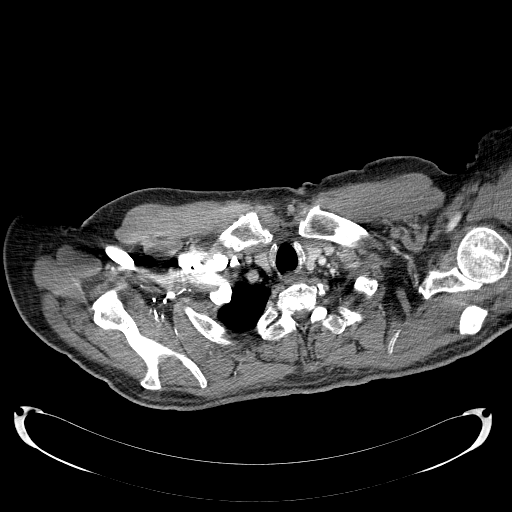

[Series 602: <mpr thick range> · coronal · 1.29mm/px · 3 of 101 slices shown]
[im 34/101  soft-tissue]
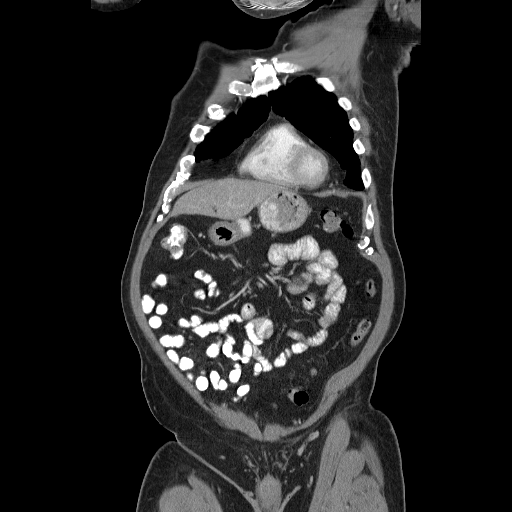
[im 45/101  soft-tissue]
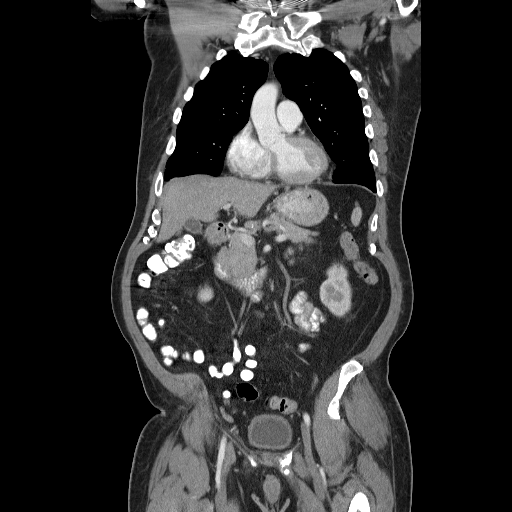
[im 56/101  soft-tissue]
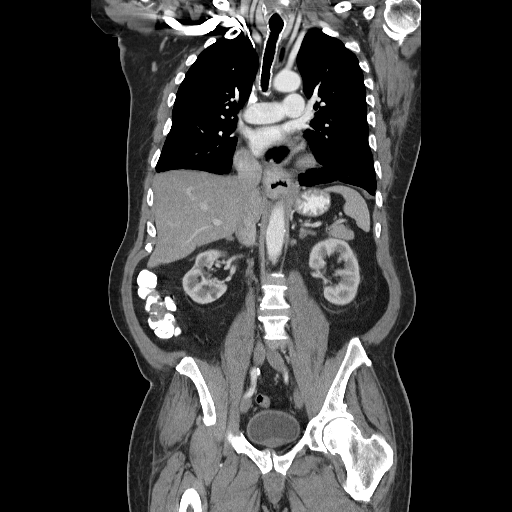

[15 of 46 positions shown; findings below may reference images not displayed]

FINDINGS: CT CHEST FINDINGS

Mediastinum: Marked irregular thickening of the distal esophagus at
and immediately above the gastroesophageal junction, corresponds to
known esophageal mass. Proximal to this, the esophagus is markedly
dilated, with a a large air-fluid level. Prominent low right
paratracheal lymph node measures 8 mm in short axis. Subcarinal
lymph node measuring 9 mm in short axis. No definite mediastinal or
hilar lymph nodes meet criteria for pathologic enlargement at this
time. Heart size is normal. There is no significant pericardial
fluid, thickening or pericardial calcification.

Lungs/Pleura: Throughout the lung bases bilaterally there is
widespread ground-glass attenuation with associated septal
thickening, thickening of the peribronchovascular interstitium and
mild cylindrical bronchiectasis. This pattern is favored to reflect
nonspecific interstitial pneumonia (NSIP). No acute consolidative
airspace disease. No pleural effusions. Multiple tiny calcified
granulomas throughout the lung bases bilaterally.

Musculoskeletal: There are no aggressive appearing lytic or blastic
lesions noted in the visualized portions of the skeleton.

CT ABDOMEN AND PELVIS FINDINGS

Abdomen/Pelvis: Enlarged lymph node in the gastrohepatic ligament
measuring 16 mm in short axis (image 55 of series 2), concerning for
nodal metastasis. The thickening of the gastroesophageal junction
does not involve the more distal portions of the stomach. The
appearance of the liver, gallbladder, pancreas, spleen and bilateral
adrenal glands is unremarkable. Sub cm low-attenuation lesions in
the kidneys bilaterally are too small to definitively characterize,
but are favored to represent tiny cysts. Normal appendix. No
significant volume of ascites. No pneumoperitoneum. No pathologic
distention of small bowel. Prostate gland and urinary bladder are
unremarkable in appearance.

Musculoskeletal: There are no aggressive appearing lytic or blastic
lesions noted in the visualized portions of the skeleton.
IMPRESSION: 1. Marked irregular masslike thickening at an immediately above the
gastroesophageal junction, compatible with the reported esophageal
neoplasm. 16 mm short axis gastrohepatic ligament lymph node is
concerning for a local nodal metastasis. Note definite signs of
distal metastatic disease in the chest, abdomen or pelvis on today's
examination.
2. Findings on today's CT examination suggests scleroderma.
Specifically, the appearance of the esophagus has an achalasia type
appearance (which can be seen in the setting of scleroderma), and
there are imaging findings in the lung bases bilaterally which
likely reflect underlying interstitial lung disease (likely
nonspecific interstitial pneumonia (NSIP)), also commonly seen in
the setting of scleroderma. Rheumatologic consultation is suggested.
3. Additional incidental findings, as above.

## 2016-03-19 ENCOUNTER — Other Ambulatory Visit: Payer: Self-pay | Admitting: Nurse Practitioner
